# Patient Record
Sex: Female | Born: 1950 | Race: Black or African American | Hispanic: No | Marital: Married | Smoking: Never smoker
Health system: Southern US, Community
[De-identification: ages and names within clinical notes are randomized; demographics above are authoritative.]

## PROBLEM LIST (undated history)

## (undated) DIAGNOSIS — Z86010 Personal history of colonic polyps: Secondary | ICD-10-CM

## (undated) DIAGNOSIS — K642 Third degree hemorrhoids: Secondary | ICD-10-CM

## (undated) DIAGNOSIS — F32A Depression, unspecified: Secondary | ICD-10-CM

## (undated) DIAGNOSIS — D249 Benign neoplasm of unspecified breast: Secondary | ICD-10-CM

## (undated) DIAGNOSIS — F329 Major depressive disorder, single episode, unspecified: Secondary | ICD-10-CM

## (undated) DIAGNOSIS — I10 Essential (primary) hypertension: Secondary | ICD-10-CM

## (undated) DIAGNOSIS — E559 Vitamin D deficiency, unspecified: Secondary | ICD-10-CM

## (undated) DIAGNOSIS — K56609 Unspecified intestinal obstruction, unspecified as to partial versus complete obstruction: Secondary | ICD-10-CM

## (undated) DIAGNOSIS — F419 Anxiety disorder, unspecified: Secondary | ICD-10-CM

## (undated) HISTORY — PX: ABDOMINAL HYSTERECTOMY: SHX81

## (undated) HISTORY — PX: COLONOSCOPY: SHX174

## (undated) HISTORY — DX: Depression, unspecified: F32.A

## (undated) HISTORY — DX: Vitamin D deficiency, unspecified: E55.9

## (undated) HISTORY — PX: ESOPHAGOGASTRODUODENOSCOPY: SHX1529

## (undated) HISTORY — DX: Third degree hemorrhoids: K64.2

## (undated) HISTORY — DX: Personal history of colonic polyps: Z86.010

## (undated) HISTORY — PX: FINGER FRACTURE SURGERY: SHX638

## (undated) HISTORY — DX: Anxiety disorder, unspecified: F41.9

## (undated) HISTORY — PX: ANKLE FRACTURE SURGERY: SHX122

## (undated) HISTORY — PX: TONSILLECTOMY AND ADENOIDECTOMY: SUR1326

## (undated) HISTORY — PX: HEMORRHOID BANDING: SHX5850

## (undated) HISTORY — DX: Major depressive disorder, single episode, unspecified: F32.9

## (undated) HISTORY — PX: OVARIAN CYST REMOVAL: SHX89

---
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
## 1998-05-11 ENCOUNTER — Encounter: Payer: Self-pay | Admitting: Emergency Medicine

## 1998-05-11 ENCOUNTER — Inpatient Hospital Stay (HOSPITAL_COMMUNITY): Admission: EM | Admit: 1998-05-11 | Discharge: 1998-05-12 | Payer: Self-pay | Admitting: Emergency Medicine

## 1998-05-29 ENCOUNTER — Ambulatory Visit (HOSPITAL_COMMUNITY): Admission: RE | Admit: 1998-05-29 | Discharge: 1998-05-29 | Payer: Self-pay | Admitting: Orthopedic Surgery

## 1999-07-20 ENCOUNTER — Other Ambulatory Visit: Admission: RE | Admit: 1999-07-20 | Discharge: 1999-07-20 | Payer: Self-pay | Admitting: Obstetrics and Gynecology

## 2000-04-24 ENCOUNTER — Inpatient Hospital Stay (HOSPITAL_COMMUNITY): Admission: EM | Admit: 2000-04-24 | Discharge: 2000-04-26 | Payer: Self-pay | Admitting: *Deleted

## 2000-04-26 ENCOUNTER — Inpatient Hospital Stay: Admission: EM | Admit: 2000-04-26 | Discharge: 2000-04-29 | Payer: Self-pay | Admitting: Internal Medicine

## 2000-04-26 ENCOUNTER — Encounter (INDEPENDENT_AMBULATORY_CARE_PROVIDER_SITE_OTHER): Payer: Self-pay | Admitting: Specialist

## 2004-01-31 ENCOUNTER — Emergency Department (HOSPITAL_COMMUNITY): Admission: EM | Admit: 2004-01-31 | Discharge: 2004-01-31 | Payer: Self-pay | Admitting: Emergency Medicine

## 2004-02-21 ENCOUNTER — Emergency Department (HOSPITAL_COMMUNITY): Admission: EM | Admit: 2004-02-21 | Discharge: 2004-02-21 | Payer: Self-pay | Admitting: Emergency Medicine

## 2006-06-19 ENCOUNTER — Emergency Department (HOSPITAL_COMMUNITY): Admission: EM | Admit: 2006-06-19 | Discharge: 2006-06-19 | Payer: Self-pay | Admitting: Emergency Medicine

## 2006-07-18 ENCOUNTER — Inpatient Hospital Stay (HOSPITAL_COMMUNITY): Admission: RE | Admit: 2006-07-18 | Discharge: 2006-07-21 | Payer: Self-pay | Admitting: Obstetrics and Gynecology

## 2006-07-18 ENCOUNTER — Encounter (INDEPENDENT_AMBULATORY_CARE_PROVIDER_SITE_OTHER): Payer: Self-pay | Admitting: Specialist

## 2007-04-04 ENCOUNTER — Ambulatory Visit: Payer: Self-pay | Admitting: *Deleted

## 2007-04-04 ENCOUNTER — Inpatient Hospital Stay (HOSPITAL_COMMUNITY): Admission: AD | Admit: 2007-04-04 | Discharge: 2007-04-06 | Payer: Self-pay | Admitting: *Deleted

## 2007-04-18 ENCOUNTER — Inpatient Hospital Stay (HOSPITAL_COMMUNITY): Admission: EM | Admit: 2007-04-18 | Discharge: 2007-04-21 | Payer: Self-pay | Admitting: Emergency Medicine

## 2007-04-21 ENCOUNTER — Inpatient Hospital Stay (HOSPITAL_COMMUNITY): Admission: AD | Admit: 2007-04-21 | Discharge: 2007-04-25 | Payer: Self-pay | Admitting: *Deleted

## 2007-04-26 ENCOUNTER — Other Ambulatory Visit (HOSPITAL_COMMUNITY): Admission: RE | Admit: 2007-04-26 | Discharge: 2007-07-25 | Payer: Self-pay | Admitting: Psychiatry

## 2007-04-26 ENCOUNTER — Ambulatory Visit: Payer: Self-pay | Admitting: Psychiatry

## 2007-05-22 ENCOUNTER — Emergency Department (HOSPITAL_COMMUNITY): Admission: EM | Admit: 2007-05-22 | Discharge: 2007-05-22 | Payer: Self-pay | Admitting: Emergency Medicine

## 2010-11-10 NOTE — H&P (Signed)
[name], [name] NO.:  [id number]   MEDICAL RECORD NO.:  [id number]          PATIENT TYPE:  INP   LOCATION:  0105                         FACILITY:  [facility]   PHYSICIAN:  Beckey Rutter, MD  DATE OF BIRTH:  02/17/1951   DATE OF ADMISSION:  04/18/2007  DATE OF DISCHARGE:                              HISTORY & PHYSICAL   CHIEF COMPLAINT:  Overdose.   HISTORY OF PRESENT ILLNESS:  This is 60 year old female with past  medical history significant for major depression disorder with multiple  hospitalizations to behavioral health facility, brought in today by EMS  after she took Ambien and Effexor in attempted suicide.  The overdose  happened this morning around 6 o'clock in the morning, was estimated no  more than 15 tablets of Ambien and less than 90 tablets of Effexor, and  it was estimated less than that.  Dose estimation was done because the  Ambien was filled 15 tablets only, and the Effexor was filled 90 tablets  only.  The patient also has similar suicide attempts in the past, and in  the last week or so she had attempted to overdose but was caught by the  husband who is at the bedside now giving history.  The patient also had  recent admission to Behavior Health floor about two weeks ago. During  that time she stayed 3 days under psychiatrist management.  Her primary  psychiatrist is Dr. Wynonia Lawman.  The patient now could not give history  herself, but she seems very sedated, having difficulty responding to  simple commands like open your mouth.  She is otherwise not  communicable.   PAST MEDICAL HISTORY:  Significant for major depression disorder with  previous attempted suicide.   FAMILY HISTORY:  Is significant for thyroid disease on the mother's  side.   SOCIAL HISTORY:  She lives at home with her husband and daughters. No  drug abuse, not a drinker and not a smoker.   MEDICATION ALLERGIES:  To PENICILLIN, no reaction specified.   MEDICATIONS:  1. Ambien 5 mg at night.  2. Effexor 300 mg in the morning.  3. Hormone replacement therapy, but the family could not remember the      name.   REVIEW OF SYSTEMS:  As per HPI.   PHYSICAL EXAMINATION:  GENERAL:  She was lying flat in bed not in acute  respiratory distress.  VITAL SIGNS:  Temperature is 97.3, blood pressure 88/65, pulse 97,  respiratory rate 12.  HEENT:  Head:  Atraumatic, normocephalic.  Eyes: PERRLA.  Mouth moist.  No ulcer.  NECK:  Supple.  No JVD.  CARDIAC:  Precordium first and second heart sounds audible.  No added  sounds.  LUNGS:  Bilateral fair air entry.  No adventitious sounds.  ABDOMEN:  Soft, no tenderness on palpation.  GU:  The patient has a Foley catheter in place.  EXTREMITIES:  No lower extremity edema.  NEUROLOGIC:  The patient seems sedated. She responds only to touch and  tactile stimuli by opening her eyes, but otherwise she is not  communicable. She responds to simple commands  like open her mouth, and  she does that in a slow, lethargic way.   LABORATORY AND X-RAY DATA:  Troponin less than 0.05, CK-MB less than  1.0. Second troponin is also less than 0.05.  Urinalysis showing yellow  urine, clear, negative nitrate and leukocyte Estrace. Urine drug screen  is essentially negative. Even benzodiazepines are stated as undetected.  Salicylate is less than 0.4.  Alcohol is less than 5.  Sodium is 139,  potassium low at 2.5, chloride 107, bicarb 25, glucose 120, BUN 2,  creatinine 0.70. Tylenol level in less than 10.0. White count is 2.7,  hemoglobin 9.3, hematocrit 28.7, and platelet count is 369.   EKG documented showing sinus rhythm at 100 beats per minute rate.  Normal axis, normal QRS and ST depression.   ASSESSMENT:  This is a 60 year old female with overdose.   PLAN:  1. The patient will be admitted to ICU for further management.  2. We will consider contacting the [facility].  3. Will keep the patient on IV fluid monitoring.  We will check her      labs, and we will continue to perform neurological checks.  4. We will consider psychiatric evaluation in due time.  5. For DVT prophylaxis, I will consider Lovenox.  6. For GI prophylaxis, I will consider Protonix.      Beckey Rutter, MD  Electronically Signed     EME/MEDQ  D:  04/18/2007  T:  04/18/2007  Job:  [id number]

## 2010-11-10 NOTE — H&P (Signed)
[name], GOSNEY NO.:  [id number]   MEDICAL RECORD NO.:  [id number]          PATIENT TYPE:  IPS   LOCATION:  0601                          FACILITY:  [facility]   PHYSICIAN:  Vic Ripper, P.A.-C.DATE OF BIRTH:  11/08/1950   DATE OF ADMISSION:  04/21/2007  DATE OF DISCHARGE:                       PSYCHIATRIC ADMISSION ASSESSMENT   IDENTIFYING INFORMATION:  This is a 60 year old married African American  female.  She was admitted to the main hospital 04/18/2007, after an  intentional overdose on 10-15 tabs of Ambien and 90 Effexor.  She  reports that she has had depression since age 74.  She was recently with  [place] 04/04/2007 to 04/06/2007.  She felt the depression coming on.  She  tried to get ahead of it by coming into the hospital.  She has had a  variety of stressors, mostly involving her children.  Her daughter, who  is bipo9lar, is having a child out of wedlock.  Her husband was laid  off, and her grown son is also having some type of issues.  The patient  states that she was feeling catastrophic levels of anxiety, and that  there was no other way out other than to overdose.   PAST PSYCHIATRIC HISTORY:  She has been hospitalized at least 3 times in  the past, earlier this month 04/04/2007 to 04/06/2007.  She was also  admitted in 2001, to the [facility].  At that time, she  was having auditory and visual hallucinations that were mood congruent.  At that time, she was discharged on Depakote1000, Risperdal 2, and  Effexor 300 mg p.o. daily.  She states that her outpatient psychiatrist  of 15 years, Dr. Wynonia Lawman, ended up stopping the Risperdal due to  bloating.  She had also attempted suicide 15 years ago, and at that  time, she was admitted to the then [facility].   SOCIAL HISTORY:  She has her Masters Training and development officer in Social Work from [employer].  She has been married once.  She has 3 children, a daughter age  68, a son 51, and  another daughter 30.  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   FAMILY HISTORY:  According to the intake record, her daughter and  grandmother are bipolar.  She denies alcohol and drug history.   PRIMARY CARE PHYSICIAN:  Dr. Tillman Sers, OB/Gyn; Dr. Wynonia Lawman,  outpatient psychiatrist.   MEDICAL PROBLEMS:  She is status post hysterectomy in January 2008.  She  was discharged from the main hospital with medical diagnoses of anemia  and transient hyperkalemia.  She had some iron studies performed; this  showed she had a normal B12 and folate.  She iron level was only 21, and  she would probably benefit from oral iron; therefore, they started 325  mg iron t.i.d.   MEDICATIONS:  She came over from the main hospital with a suggestion of  Restoril 15 mg at bedtime and, also Dr. Jeanie Sewer suggested starting  Cymbalta in his consultation.   ALLERGIES:  PENICILLIN.   PHYSICAL EXAMINATION:  GENERAL:  It is well documented and  on the chart.  VITAL SIGNS ON ADMISSION:  Height 66 inches.  Weight 158.  Temperature  98.  Blood pressure 152/96.  Pulse 118.  Respirations 18.   MENTAL STATUS EXAM:  She is alert and oriented x3.  She is appropriately  groomed, dressed, and nourished.  Her speech is not pressured.  Her mood  is depressed and anxious.  Affect is congruent.  Her thought processes  are fairly clear, rational, and goal oriented.  She is concerned about  making arrangements at her work place.  Judgment and insight are good.  Concentration and memory are good.  Intelligence is at least average.  She denies being suicidal or homicidal.  She denies auditory, visual  hallucinations.   DIAGNOSES:  AXIS I:  Major depressive disorder, recurrent severe.  Mood  disorder NOS.  Family history for bipolar.  AXIS II:  Deferred.  AXIS III:  Normocytic anemia with iron deficiency, hypokalemia.  AXIS IV:  Severe problems with primary support group.  AXIS V:  30.   PLAN:  Admit for safety and  stabilization.  We can go ahead and initiate  Cymbalta therapy as suggested by Dr. Jeanie Sewer.  Given her history for  suicidal ideation and anxiety as well as sleep issues, we will start  some Zyprexa Zydis at bedtime to help with sleep and mood stabilization.   ESTIMATED LENGTH OF STAY:  Four to 5 days.      Vic Ripper, P.A.-C.     MD/MEDQ  D:  04/22/2007  T:  04/23/2007  Job:  [id number]

## 2010-11-10 NOTE — Consult Note (Signed)
NAMEMarland [name]  [name], VENO NO.:  [id number]   MEDICAL RECORD NO.:  [id number]          PATIENT TYPE:  IPS   LOCATION:  0601                          FACILITY:  [facility]   PHYSICIAN:  Antonietta Breach, M.D.  DATE OF BIRTH:  02/06/1951   DATE OF CONSULTATION:  04/20/2007  DATE OF DISCHARGE:                                 CONSULTATION   The patient continues with depressed mood, thoughts of hopelessness,  helplessness, low energy, difficulty concentrating and insomnia.   LABORATORY DATA:  B-12 is within normal limits.  Iron is decreased at 21  with a TIBC within normal limits and a 5% saturation.   PHYSICAL EXAMINATION:  VITAL SIGNS:  Temperature 98.7, pulse 95,  respiratory rate 18, blood pressure 125/69, O2 saturation on room air  98%.  The QTC has improved on telemetry and now is down to 470  milliseconds.   MENTAL STATUS EXAM:  Alert, oriented to all spheres.  Eye contact  intermittent.  Attention span mildly decreased.  Concentration  decreased.  Affect constricted.  Mood depressed.  Speech soft with  slightly flat prosody.  There is no dysarthria.   Thought process logical, coherent, goal-directed.  No looseness of  associations.  Thought content - acknowledges suicidal intent, thoughts  of hopelessness and helplessness.  No hallucinations or delusions.   Insight is intact for depression.  Judgment is impaired for self-care;  however, she does have the capacity for informed consent.   ASSESSMENT:  1. 293.84 - anxiety disorder not otherwise specified.  2. 296.33 - major depressive disorder, recurrent, severe.   The patient requested that her husband attend the session for  facilitating education and support.   The undersigned provided ego supportive psychotherapy and education.  The biopsychosocial precipitants and predispositions of depression were  discussed.  The biologic therapy with Cymbalta was discussed, as well as  the delay that occurred due to the  prolongation of the QTC.   RECOMMENDATIONS:  Continue the sitter.  Admit to a psychiatric ward for  further evaluation and treatment once medically cleared.   RECOMMENDATIONS:  Will delay starting the Cymbalta, given that the  patient will be transferring to a psychiatric hospital soon, and the  discussion of all therapies can proceed initially with the patient and  her inpatient psychiatrist.   TIME FOR THIS FOLLOW UP:  Forty-five minutes.      Antonietta Breach, M.D.  Electronically Signed     JW/MEDQ  D:  04/22/2007  T:  04/24/2007  Job:  [id number]

## 2010-11-10 NOTE — Consult Note (Signed)
NAMECHANCI, OJALA NO.:  [id number]   MEDICAL RECORD NO.:  [id number]          PATIENT TYPE:  INP   LOCATION:  1503                         FACILITY:  [facility]   PHYSICIAN:  Antonietta Breach, M.D.  DATE OF BIRTH:  01/14/1951   DATE OF CONSULTATION:  04/19/2007  DATE OF DISCHARGE:                                 CONSULTATION   REFERRING PHYSICIAN:  [facility].   REASON FOR CONSULTATION:  Suicide attempt with overdose.   HISTORY OF PRESENT ILLNESS:  [name] is a 60 year old  female admitting to [facility] on April 18, 2007 after a  deliberate overdose with Ambien and Effexor in order to kill herself.  [name] has been experiencing a number of stressors.  Her husband  was laid off. Also one of her children has a pregnancy out of wedlock.  She was trying to go back to work in the past few weeks and has  experienced two weeks of worsening mood, thoughts of hopelessness and  helplessness, low energy, difficulty concentrating, and insomnia.  She  does acknowledge that she was trying to kill herself.  She states that  she was also feeling catastrophic levels of anxiety as if there were no  other way out.   PAST PSYCHIATRIC HISTORY:  The patient does have a history of at least  three psychiatric hospitalizations with the last psychiatric  hospitalization earlier this month at the [facility].   [name] has attempted suicide approximately 15 years ago.  At that  time she was admitted to [facility].   In 2001, she was admitted to the [facility].  She was  having auditory and visual hallucinations that were mood congruent at  that time.  She was discharged on Depakote 1000 mg daily, Risperdal 2 mg  daily, and Effexor 300 mg daily.  The patient denies any history of  excess energy, decreased need for sleep or racing thoughts.   FAMILY PSYCHIATRIC HISTORY:   SOCIAL  HISTORY:  [name] is married to a supportive husband.  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  She does not use any illegal  drugs or alcohol.  She is a Chiropodist.   PAST MEDICAL HISTORY:  Status post overdose.   ALLERGIES:  PENICILLIN, NICKEL, LANOLIN.   MEDICATIONS:  The MAR is reviewed.  The patient is not on any current  psychotropic medications.   LABORATORY DATA:  WBC 5.5, hemoglobin 9, platelet count 319.  SGOT 19,  SGPT 11. TSH was within normal limits on October 7.  Urine drug screen  negative.  Aspirin negative.  Alcohol negative.  Tylenol negative.   The patient's QTC on telemetry today has been between 480 msec and 580  msec.   REVIEW OF SYSTEMS:  CONSTITUTIONAL:  Afebrile.  No weight loss.  HEAD:  No trauma.  EYES:  No visual changes.  EARS:  No hearing impairment.  NOSE:  No rhinorrhea.  MOUTH/THROAT:  No sore throat.  NEUROLOGIC:  No  focal motor or sensory changes.  PSYCHIATRIC:  As  above.  CARDIOVASCULAR:  As above.  No chest pain, palpitations.  RESPIRATORY:  No coughing, no wheezing.  GASTROINTESTINAL:  No nausea, vomiting,  diarrhea.  GENITOURINARY:  No dysuria.  SKIN:  Unremarkable.  ENDOCRINE:  Metabolic.  No heat or cold intolerance.  MUSCULOSKELETAL:  No deformities.  HEMATOLOGIC/LYMPHATIC:  Anemia.   PHYSICAL EXAMINATION:  VITAL SIGNS:  Temperature 98.5, pulse 71,  respiratory rate 20, blood pressure 158/81, oxygen saturation on room  air 96%.  GENERAL APPEARANCE:  [name] is a middle-aged female, partially  reclined in a supine position in her hospital bed.  She has no abnormal  involuntary movements.   OTHER MENTAL STATUS EXAM:  [name] has intermittent eye contact.  Her attention span is mildly decreased.  Concentration is mildly  decreased.  Affect is constricted.  Mood is very depressed.  She is  oriented in all spheres.  Her memory is intact to immediate, recent, and  remote except for the overdose blackout.  She has  normal rate of speech,  slightly flat prosody.  There is no dysarthria.  Her fund of knowledge  and intelligence are within normal limits.  Thought process is logical,  coherent, goal directed, no looseness of associations. Abstraction is  intact.  Language expression and comprehension is intact.  Thought  content:  She acknowledges suicide intent.  She has no thoughts of  harming others.  No delusions.  No hallucinations.  Insights are  partial.  Judgment is impaired.   ASSESSMENT:  Axis I:  1.  293.84.  Anxiety disorder, not otherwise  specified.  1. 296.33.  Major depressive disorder, recurrent, severe.  Axis II:  Deferred.  Axis III:  See general medical section above.  Axis IV:  Primary support group.  Axis V:  30.   [name] is still at risk to harm herself.   The undersigned provided ego supportive psychotherapy and education.   The indications, alternatives, and adverse effects of Cymbalta were  discussed with the patient for antidepression.  The patient understands  and would like to start Cymbalta.   Given that the QTC has been prolonged after apparently secondary to the  absorption of the Effexor overdose.  Will allow the QTC to fall back  down to consistently below 500 before starting the Cymbalta.   Would continue the sitter for suicide precautions.   Discuss Restoril for anti-insomnia.  The patient would like to start  Restoril.  Will start Restoril 15 mg at bedtime p.r.n. insomnia.   Would admit the patient to a psychiatric unit for further evaluation and  treatment once medically clear.      Antonietta Breach, M.D.  Electronically Signed    JW/MEDQ  D:  04/21/2007  T:  04/21/2007  Job:  [id number]

## 2010-11-10 NOTE — Discharge Summary (Signed)
NAMEJENAYA, [name]             ACCOUNT NO.:  [id number]   MEDICAL RECORD NO.:  [id number]          PATIENT TYPE:  INP   LOCATION:  1503                         FACILITY:  [facility]   PHYSICIAN:  Lonia Blood, M.D.      DATE OF BIRTH:  May 01, 1951   DATE OF ADMISSION:  04/18/2007  DATE OF DISCHARGE:  04/20/2007                               DISCHARGE SUMMARY   PRIMARY CARE PHYSICIAN:  The patient is unassigned to [place].   DISCHARGE DIAGNOSES:  1. Drug overdose with Ambien and Effexor.  2. Attempted suicide.  3. Normocytic anemia with iron deficiency.  4. Transient hyperkalemia.  5. Major depression.   DISCHARGE MEDICATIONS:  1. Restoril 50 mg p.o. q.h.s. p.r.n.  2. Protonix 40 mg p.o. daily.   DISPOSITION:  The patient is to be transferred to inpatient psych unit  per Dr. Providence Crosby recommendation for further therapy.  She is off  Cymbalta until her QTc interval improves again.  It was 446 initially.   PROCEDURES:  Chest x-ray on April 18, 2007, that showed no evidence of  acute cardiopulmonary disease.   CONSULTATIONS:  Dr.  Jeanie Sewer, Psychiatry.   HISTORY OF PRESENT ILLNESS:  Please refer to History and Physical  dictated by Dr. Tamsen Roers.  In short, however, the patient is a 60 year old  with known history of major depression and multiple psychiatric  evaluations in the past with admission.  She was brought in secondary to  intentional drug overdose with Ambien and Effexor with attempted  suicide.  She took probably 10-15 tablets of Ambien and 90 tablets of  Effexor.  At the time of admission, the patient was completely knocked  out.  Her blood pressure was 88/65. Her lab work also showed prolonged  QTc interval at 446.  Otherwise, no significant findings.  She was  subsequently admitted for further management.   HOSPITAL COURSE:  #1 - INTENTIONAL DRUG OVERDOSE.  The patient was  observed initially in the ICU with complete telemetry monitoring.  Supportive measures include  IV fluids and this in turn was given.  The  patient was worked up over 24 hours, and she seems stabilized.  She was  seen by Psychiatry and was evaluated.  Recommendation was for inpatient  psych.  Hence, we will continue with that.   #2 - MAJOR DEPRESSION.  The patient is off Cymbalta once her QTc  interval stabilizes.  However, she will have inpatient psych management  as indicated .   #3 - ANEMIA.  She has some iron studies performed due to hemoglobin and  hematocrit.  That showed that she had normal B12 and folate.  However,  her iron level is only 21.  She is not having hematopoesis with a  reticulocyte count of 0.8.  She will probably benefit from oral iron,  therefore, like 325 mg t.i.d.   #4 - TRANSIENT HYPERKALEMIA.  Her potassium was 3.01 on admission.  However, that has been corrected.      Lonia Blood, M.D.  Electronically Signed     LG/MEDQ  D:  04/20/2007  T:  04/20/2007  Job:  [id number]

## 2010-11-10 NOTE — Discharge Summary (Signed)
NAMEELOYSE, CAUSEY NO.:  [id number]   MEDICAL RECORD NO.:  [id number]          PATIENT TYPE:  IPS   LOCATION:  0305                          [facility]ACILITY:  [facility]   PHYSICIAN:  Anselm Jungling, MD  DATE OF BIRTH:  02/01/1951   DATE OF ADMISSION:  04/04/2007  DATE OF DISCHARGE:  04/06/2007                               DISCHARGE SUMMARY   IDENTIFYING DATA AND REASON FOR ADMISSION:  This was an inpatient  psychiatric admission for Mrs. Ibanez, a 60 year old married African  American female who is a patient of Dr. Wynonia Lawman, and Saul Fordyce, nurse  practitioner.  She was admitted due to worsening depression with  suicidal ideation.  She had been on a regimen of Effexor, Ambien, and  Remeron, but had had increasing weight loss, inability to perform at  work.  Her husband had just been laid off which was an additional  stressor.  She had been on Effexor for 10 years at a low dose, but her  dose had been increased two weeks prior.  Please refer to the admission  note for further details pertaining to the symptoms, circumstances and  history that led to her hospitalization.  She was given an initial Axis  I diagnosis of major depressive disorder, recurrent, without psychotic  features.   MEDICAL AND LABORATORY:  The patient was medically and physically  assessed by the psychiatric nurse practitioner.  She came to [place] with a  history of good health.  She had been taking estradiol, and this was  continued during her stay.   HOSPITAL COURSE:  The patient was admitted to the adult inpatient  psychiatric service.  She presented as a slender, but normally developed  and healthy-appearing woman who was alert, fully oriented, quite  articulate, with good insight.  Her mood was depressed with sad affect..  She denied suicidal ideation in the initial interview.  There were no  signs or symptoms of psychosis or thought disorder.  She verbalized a  desire for help, but expressed  feeling defeated at needing to come for  inpatient hospital treatment.   The patient was continued on her increased dose of Effexor, as it was  felt that this was a reasonable medication strategy that needed more  time to work.  She did not present much in the way of acute symptoms  requiring inpatient hospitalization, so immediate consideration was  given to step-down to the intensive outpatient program.  However, the  patient indicated that she was not interested in this, and was hoping  for a rapid transition back to working.  She did agree to go to follow-  up aftercare appointments with Saul Fordyce and Dr. Warnell Bureau.   On the third hospital day there was a family session involving the  patient, her husband and daughter.  The family members talked of their  frustration regarding the patient's denial of her symptoms of  depression.  The patient admitted that this was a problem and spoke of  her shame concerning her mood disorder and difficulty talking about it  within the family.  The family members were  guarded about their sharing  of feelings amongst each other and stressed that because the patient  daughter also has a mood disorder and lives at home, there was further  attention present, in part because the daughter spoke of her intention  to move out of the house soon.  The patient spoke of her wanting to  return to work soon and to follow-up with her outpatient psychiatrist.  The family overall was very supportive but quite frustrated.  Education  was given regarding the [employer], and  codependency.  The patient indicated that she felt ready for discharge.  She denied suicidal ideation.  She was discharged following this  meeting.   AFTERCARE:  The patient agreed to follow-up with Dr. Wynonia Lawman on May 04, 2007, and with Tresa Endo vigil on April 13, 2007.   The patient was instructed to continue her medications at their previous  doses,  including Effexor, 200 mg q. a.m. and 100 mg q.p.m., Remeron 15  mg q.h.s., Ambien 10 mg h.s. p.r.n. insomnia and estradiol 1 mg every  other day.   DISCHARGE DIAGNOSES:  AXIS I: Major depressive disorder, recurrent,  without psychotic features.  AXIS II: Deferred.  AXIS III: No acute or chronic illnesses.  AXIS IV: Stressors severe.  AXIS V: GAF on discharge 65.      Anselm Jungling, MD  Electronically Signed     SPB/MEDQ  D:  05/12/2007  T:  05/12/2007  Job:  [id number]

## 2010-11-13 NOTE — Op Note (Signed)
NAME:  [name], [name] NO.:  [id number]   MEDICAL RECORD NO.:  [id number]          PATIENT TYPE:  INP   LOCATION:  9318                          FACILITY:  [facility]   PHYSICIAN:  Malva Limes, M.D.    DATE OF BIRTH:  [birth year]   DATE OF PROCEDURE:  07/18/2006  DATE OF DISCHARGE:                               OPERATIVE REPORT   PREOPERATIVE DIAGNOSIS:  Large left ovarian mass, probable dermoid cyst.   POSTOPERATIVE DIAGNOSIS:  Large left ovarian mass, probable dermoid  cyst, with extensive adhesions.   PROCEDURES:  1. Total abdominal hysterectomy and left salpingo-oophorectomy.  2. Lysis of adhesions.   SURGEON:  Malva Limes, M.D.   ASSISTANT:  Luvenia Redden, M.D.   ANESTHESIA:  General endotracheal.   ANTIBIOTICS:  Mefoxin 1 g.   DRAINS:  Foley to bedside drainage.   ESTIMATED BLOOD LOSS:  350 mL.   SPECIMENS:  1. Cervix, uterus, fallopian tubes and left large ovarian cyst sent to      pathology.  2. Frozen section was obtained on the ovary, which revealed a benign      dermoid cyst.   PROCEDURE:  The patient was taken to the operating room, where she was  placed in a dorsal supine position.  A general anesthetic was  administered without complications.  She was then prepped and draped in  the usual fashion for this procedure.  A Foley catheter was placed in  her bladder.  At this point a vertical skin incision was made.  This was  carried down to the fascia.  The fascia was entered at the midline.  The  rectus muscles were dissected and opened sharply.  The parietal  peritoneum was entered sharply and taken superiorly and inferiorly.  On  entering the abdominal cavity, the patient was noted to have extensive  adhesion involving the small bowel, posterior cul-de-sac, left pelvic  sidewall, uterus, and ovarian cyst.  The patient also had extensive  adhesions, perihepatic adhesions to the diaphragm.  There was no  evidence of any satellite lesions  on the bowel or in the pelvis.  The  capsule of the ovary appeared to be smooth, however multilobulated.  Initially an O'Connor-O'Sullivan retractor was placed and an much of the  bowel packed away.  The small bowel which was adherent to the left ovary  was dissected with Metzenbaums and Bovie.  Once this was accomplished,  the remaining bowel was packed away.  At this point the left round  ligament was ligated and cauterized with the Bovie.  The broad ligament  was opened, the ureter identified.  Once this was accomplished, the  adhesions involving the left ovary and the uterus were taken down  sharply.  Following this, the adhesions involving the posterior cul-de-  sac and ovary were all taken down sharply.  Once this was accomplished,  the uterus was elevated, infundibulopelvic ligament isolated, doubly  ligated x2 and transected.  The specimen was then handed off to be sent  to the lab for frozen section.  Once this was accomplished, small areas  of bleeding were made hemostatic  with the Bovie.  At this point the  round ligament on the right was ligated and transected with the Bovie.  The bladder flap was taken down sharply.  The uterine vessels were  skeletonized, bilaterally clamped and cut and ligated with 0 Monocryl  suture.  The cardinal ligaments were then serially clamped, cut and  ligated with 0 Monocryl suture.  Once the level of the external os was  reached, the vagina was crossclamped and the specimen removed.  The  vaginal angles were then sutured with 0 Monocryl suture in a Heaney  fashion.  The remaining vaginal cuff was closed using interrupted 0  Monocryl suture in a figure-of-eight fashion.  Following this the pelvis  was irrigated and found to be hemostatic.  The ureters were again  reexamined and felt to be normal, well out of the operative field.  The  bowel was then run.  No evidence of any other adhesions or lesions were  identified.  He once this was  accomplished, the retractor was removed,  the laps removed, and the vertical incision closed with 0 PDS in a  running Smead-Jones fashion.  Subcuticular tissue was made hemostatic  with interrupted 2-0 plain gut suture.  Stainless steel clips were used  to close the skin.  The patient was extubated and taken to the recovery  room in stable condition.  Instrument and lap counts correct x2.           ______________________________  Malva Limes, M.D.     MA/MEDQ  D:  07/18/2006  T:  07/18/2006  Job:  [id number]

## 2010-11-13 NOTE — H&P (Signed)
NAME:  [name], [name] NO.:  [id number]   MEDICAL RECORD NO.:  [id number]          PATIENT TYPE:  AMB   LOCATION:  SDC                           [facility]ILITY:  [facility]   PHYSICIAN:  Malva Limes, M.D.    DATE OF BIRTH:  26-Sep-1950   DATE OF ADMISSION:  DATE OF DISCHARGE:                              HISTORY & PHYSICAL   Office chart number, [id number].   HISTORY OF PRESENT ILLNESS:  [name] is a 60 year old black female,  G4, P3--1-3, who presents to [facility] for a total abdominal  hysterectomy with left salpingo-oophorectomy secondary to a large  ovarian mass felt to be a dermoid cyst.  The patient presented to the  emergency room on June 19, 2006, complaining of a 46-month history of  worsening right upper quadrant pain and lower abdominal pain.  The  patient had not had a GYN exam for 7 years prior to that.  The patient  underwent an extensive workup in the emergency room.  She was found to  have a 14-to-15-cm pelvic mass which was felt to be consistent with a  dermoid cyst.  The patient also had a questionable 1-cm perirectal node.  The patient has no ascites.  A normal liver and gallbladder ultrasound  was also obtained.  The patient's blood work was all normal.  The  patient states that her pain seems to exacerbation when she has a full  bladder and shortly after eating.  The patient had a negative  colonoscopy 3 years ago.  She also has a history of right salpingo-  oophorectomy in 1970 for a dermoid cyst.  The patient did have a CA-125  level which was 70.2, slightly elevated.  This was discussed with the  GYN oncologist.  Given the findings on CT scan, it was felt that it was  unlikely that the patient had a malignancy.  However, a frozen section  will be obtained on the mass after it will be removed.  Pelvic washings  will also be obtained.   PAST MEDICAL HISTORY:  THE PATIENT HAS AN ALLERGY TO PENICILLIN.   CURRENT MEDICATIONS:  Venlafaxine  hydrochloride 75 mg every day.   PAST SURGERIES:  1. Right salpingo-oophorectomy as mentioned above.  2. Bilateral tubal ligation in 1982.   The patient has also had a normal mammogram one month ago.   The patient denies smoking, alcohol, or drug use.   The patient has a family history of cardiovascular disease,  hypertension, and stroke.  She has no history of ovarian or colon  cancer.   PHYSICAL EXAMINATION:  VITAL SIGNS:  The patient weighs 178 pounds.  She  is 5 feet 8 inches.  GENERAL:  She appears to be in no apparent distress.  HEENT:  Within normal limits.  LUNGS:  Clear to auscultation.  CARDIOVASCULAR:  Reveals a regular rate and rhythm without murmurs.  BREASTS:  Normal.  There are no masses or lymphadenopathy.  ABDOMEN:  Soft, nontender.  I am unable to completely palpate the  abdominal mass.  She has no guarding or rebound.  There is no tympani.  PELVIC:  Reveals normal external genitalia.  The vagina is without  lesions or discharge.  The cervix is parous.  The uterus is not easily  palpated.  The adnexa is without masses.  RECTAL:  The rectum is normal except for a hemorrhoid.   IMPRESSION:  Symptomatic left ovarian mass, likely dermoid.   PLAN:  Proceed with total abdominal hysterectomy and left salpingo-  oophorectomy.           ______________________________  Malva Limes, M.D.     MA/MEDQ  D:  07/16/2006  T:  07/16/2006  Job:  [id number]

## 2010-11-13 NOTE — Discharge Summary (Signed)
[name], [name] NO.:  [id number]   MEDICAL RECORD NO.:  [id number]          PATIENT TYPE:  IPS   LOCATION:  0601                          [facility]ACILITY:  [facility]   PHYSICIAN:  Anselm Jungling, MD  DATE OF BIRTH:  05/01/1951   DATE OF ADMISSION:  04/21/2007  DATE OF DISCHARGE:  04/25/2007                               DISCHARGE SUMMARY   DISCHARGE SUMMARY/TRANSFER SUMMARY:  (Discharged from inpatient  psychiatry and transferred to intensive outpatient psychiatry).   IDENTIFYING DATA/REASON FOR ADMISSION:  This was the second [facility]  admission for [name], a 60 year old married African-American female.  She had been admitted to our inpatient service earlier in the month.  She was readmitted after an overdose of 10-15 tablets of Ambien and 90  tablets of Effexor.  She had been discharged from our inpatient program  on April 06, 2007.  She was able to name various stressors, mostly  involving her children, including a daughter with bipolar disorder and  having a child out of wedlock.  Another stressor was her husband's  recently being laid off from his job.  Please refer to the admission  note for further details pertaining to the symptoms, circumstances and  history that led to her hospitalization.   INITIAL DIAGNOSTIC IMPRESSION:  She was given an initial AXIS I  diagnosis of major depressive disorder, recurrent, severe.  She had been  the patient of Dr. Wynonia Lawman.   MEDICAL/LABORATORY:  The patient was treated medically for her overdose  in the medical hospital.  While there, she was seen by Dr. Jeanie Sewer,  the psychiatric consultant who recommended transfer to inpatient  psychiatry for further stabilization.  She had been on a regimen of  Zyprexa, Cymbalta, and Restoril.  These were continued in her inpatient  stay.  She participated in various therapeutic groups and activities and  was a good participant in the treatment program.   She presented as a  well-nourished, well-developed African-American woman  who was pleasant, fully cooperative, and fully oriented.  Her mood was  depressed with sad affect.  She indicated that she had overdosed because  she had gotten overwhelmed by virtue of various stressors.  She  indicated that she was glad that she had survived her suicide attempt  and wanted help.   A family session was arranged which occurred on the day prior to  discharge.  Present for that meeting were her husband and two adult  daughters.  The patient's husband and daughters indicated that they had  become more concerned over time about the patient because this was her  fourth suicide attempt.  The family discussed their desire to talk more  openly.  The patient admitted to not telling her family when she is  depressed because of feelings of shame.  There was discussion about  aftercare plans including the patient seeing her usual outpatient  therapist, Dr. Wynonia Lawman, and possibly attending intensive outpatient  psychiatric program.   On the following day, the undersigned met with the patient.  She  appeared to be in better spirits, looked forward to going home, and  indicated  that she would definitely attend the intensive outpatient  program.  This had been recommended to her following her last  hospitalization, but she elected not to enroll in that program.   AFTERCARE:  The patient was to report to [facility] intensive outpatient  program on the morning following admission, that is April 26, 2007,  8:30 a.m.   DISCHARGE MEDICATIONS:  1. Zyprexa 5 mg q.h.s.  2. Cymbalta 30 mg daily.  3. Restoril 15 mg q.h.s.   DISCHARGE DIAGNOSES:  AXIS I:  Major depressive disorder, recurrent,  severe without psychotic features.  AXIS II:  Deferred.  AXIS III:  No acute or chronic illnesses.  AXIS IV:  Stressors:  Severe.  AXIS V:  GAF on discharge 50.      Anselm Jungling, MD  Electronically Signed     SPB/MEDQ  D:   04/26/2007  T:  04/26/2007  Job:  [id number]

## 2010-11-13 NOTE — H&P (Signed)
[facility]  Patient:    [name], DATE                    MRN: [id number] Adm. 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 The patient states "I have had a psychotic breakdown."  The patient has been under the care of Dr. ______ since June, 1995 for recurrent depression and suicidal ideas.  She has recurrent psychiatric hospitalization with past hospitalizations under Dr. ______ at [facility].  Current medicines are Effexor 300 mg q.h.s. which she tolerates well and with benefit.  The patient recently discovered her mother may be dying and shifted into depression.  She presents stating she is hallucinating auditorily and visually.  She is hearing the [facility] and the devil.  These represent to her the forces of good and evil.  She has also been visualizing hell and heaven.  Given the magnitude of her psychosis and disorganization, her hospitalization was supported on a voluntary basis.  PAST MEDICAL HISTORY:  The patient has enjoyed good physical health.  She has enjoyed good physical health all of her life.  She is under the care of Dr. Malva Limes who is her family physician.  She has a history of arthritis and low back pain and is on Celebrex.   She has had a recent bout with the flu.  ALLERGIES:  No known drug allergies.  LABORATORY DATA:  Blood work was reviewed and she is mildly hypokalemic with a potassium of 3.2  Red blood corpuscles are low at 3.4 with a hemoglobin of 7.8 and a hematocrit of 24.  PHYSICAL EXAMINATION:  The patient is going to be sent to [facility] ER for physical examination.  Will also ask Metabolic for a consult.  She does not have a history of thalassemia, weight loss, tarry stools, and she is menopausal.  Probably microcytic anemia.   She had her last physical two years ago and needs to be evaluated.  FAMILY HISTORY:  None.  SOCIAL HISTORY:  The patient has been married over 30 years.  She has three children.  She lost a child in infancy 30 years ago.  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  Her husband is a Armed forces training and education officer and they have a good marriage.  MENTAL STATUS EXAMINATION:  The patient presents as an interactive and neatly dressed lady who is able to provide a coherent history.  She is cooperative and pleasant.  Speech is normal in tone and flow.  Her mood is depressed and showed evidence of a flat affect.  She admits to visual and auditory hallucinations, but denies any paranoia.  She is cognitively intact and oriented x 3, alert.  Short term and remote memory are intact.  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 Axis II:   No diagnosis. Axis III:  Anemia, for evaluation. Axis IV:   Severe, psychosocial problems. Axis V:    Global assessment of functioning of 30 on admission and 80 during            the past year.  TREATMENT CONSIDERATIONS: 1. The patient is to be established on Risperdal  0.5 mg in a.m. and 1.5 mg    q.h.s., Depakote ER 1000 mg q.h.s. and Effexor XR dose will be lowered    to 150 mg q.d. 2. She was referred for evaluation of her anemia at [facility] Emergency    Room. 3. She is admitted to the stabilization program and will maintain on    standard 15 minute checks. 4. Urinalysis, urine drug screen, CMET, thyroid profile and CBC are requested. 5. She willl engage in activities as tolerated.  She wil be allowed p.r.n.s    and vital signs will be taken according to unit protocol. 6. The patient is to be followed daily for medication checks and    psychotherapy. 7. She will participate in individual and group cognitive behavioral therapy. DD:  04/26/00 TD:  04/26/00 Job: [id number]

## 2010-11-13 NOTE — Discharge Summary (Signed)
[facility]  Patient:    [name], [name]                    MRN: [id number] Adm. Date:  [id number] Disc. Date: [id number] Attending:  Tresa Garter CC:         Adelene Amas. Williford, M.D.  Wilhemina Bonito. Eda Keys., M.D. [facility]  Masoud S. Wynonia Lawman, M.D.   Discharge Summary  DISCHARGE DIAGNOSES: 1. Iron-deficiency anemia. 2. Cystitis. 3. Bronchitis. 4. Hypokalemia. 5. Psychosis. 6. Depression.  PROCEDURES: 1. EGD performed April 28, 2000, with esophageal stricture, hiatal hernia,    and gastroesophageal reflux disease. 2. Colonoscopy April 28, 2000, with colon diverticulosis as well as internal    and external hemorrhoids.  CONSULTATIONS: 1. Dr. Yancey Flemings, [facility]. 2. Dr. Jeanie Sewer, psychiatry.  HISTORY & PHYSICAL:  Please see that dictated on date of admission April 26, 2000.  HOSPITAL COURSE:  [name] is a 60 year old black female, under treatment for depression with Dr. Wynonia Lawman, who presented with microcytic, guaiac-negative, iron-deficiency anemia resulting in gastroenterologic consultation, procedures as above, and treatment with Prevacid and iron supplementation.  She also was seen by Dr. Jeanie Sewer with respect to her psychosis and depression with Risperdal in addition to the Effexor as on admission.  She was also treated with Tequin for urinary tract infection as well as bronchitis.  There was minor hypokalemia as well noted during hospitalization.  Her initial hemoglobin with respect to the diabetes was 7.8. She was status post two units of packed red blood cells.  Iron studies, depressed hemoglobin 9.5 prior to discharge.  No specific etiology except for the above as the etiology for her anemia from a gastroenterologic standpoint.  She did well postprocedure, seemed to respond to the Risperdal well.  She was ambulatory, eating well.  She was felt to have gained maximum benefit from this hospitalization and is  discharged to home.  DISPOSITION:  Discharged to home in good condition.  DISCHARGE MEDICATIONS: 1. Prevacid 30 mg p.o. q.d. 2. Iron sulfate 325 mg p.o. t.i.d. 3. Risperdal 0.5 mg p.o. q.d. 4. Effexor XR 150 mg p.o. q.d. 5. Tequin 400 mg p.o. q.d. for the next 4 days starting November 3 as she    has gotten 3 doses out of 7 while hospitalized.  FOLLOWUP:  She is to follow up with the psychiatrist and her primary physician in one to two weeks.  DIET:  Regular.  ACTIVITY:  No restrictions. DD:  04/29/00 TD:  04/29/00 Job: [id number]

## 2010-11-13 NOTE — Discharge Summary (Signed)
[facility]  Patient:    [name], [name]                    MRN: [id number] Adm. Date:  [id number] Disc. 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  The patient states "Lavenia Atlas had a psychotic breakdown."  Patient has been under the care of an M.D. since June 1995 for recurrent depression and suicidal ideation.  She has had recurrent psychiatric hospitalizations, with past hospitalizations at [facility].  The patient recently discovered her mother may be dying and shifted in depression.  She presents saying she is hallucinating, both auditory and visual hallucinations.  She is hearing the [facility] and the devil. These represent to her the forces of good and evil.  She has also been visualizing hell and heaven.  Given the magnitude of her psychosis and visualization, her hospitalization was supported on a voluntary basis.  Patient again has had multiple hospitalization.  PAST MEDICAL HISTORY:  Her primary care physician is Dr. Malva Limes.  She has a history of arthritis, low back pain.  Current medications:  Effexor 300 mg at h.s., Celebrex.  DRUG ALLERGIES:  No known drug allergies.  PHYSICAL EXAMINATION:  Patient was seen at [facility] ED for physical examination.  We also asked for metabolic panel.  There was no history of thalassemia, weight loss, tarry stools, and she is menopausal.  She was found to have microcytic anemia and she had her last physical two years ago.  LABORATORY DATA:  Her CBC with diff was basically abnormal.  White blood count decreased at 3.7, RBC decreased at 3.40, hemoglobin decreased at 7.8, hematocrit decreased at 24.7, MCV decreased at 32.6, MCHC decreased at 31.8, RDW high at 15.4, and platelets high at  448.  Her CMET was within normal limits with the exception of potassium that was low at 3.2.  Her T4 and TSH were within normal limits.  Her T3 uptake high at 41.6.  Urinalysis showed that she had some nitrates in her urine.  Esterase were moderate.  Many bacteria, and it was felt that she probably had a microcytic anemia.  MENTAL STATUS EXAMINATION:  On admission, patient presents as an interactive, neatly dressed lady who is overwrought according to history, cooperative, pleasant.  Speech normal in tone and flow, mood depressed and showed evidence of flat affect.  She admits to auditory and visual hallucinations. Denies any paranoia.  Cognitively intact and oriented x 3.  She is alert.  Short term and remote memory are intact.  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 Axis II:    No diagnosis. Axis III:   Anemia, to be further evaluated. Axis IV:    Severe, related to psychosocial problems. Axis V:     Current global assessment of function 30 on admission,             and 80 during past year.  HOSPITAL COURSE:  The patient was admitted to [facility] Unit.  She is to be established on Risperdal 0.5 in the morning and 1.5 mg h.s., Depakote ER 1000  mg at h.s., and Effexor XR dose was lowered to 150 q.d. Again, she was referred for evaluation of her anemia at [facility] emergency Room.  She was admitted to the stabilization program and maintained under standard 15 minute checks, and we ordered urinalysis, urine drug screen, CMET, thyroid profile, and CBC, asked her to engage in activities as tolerated, and allowed her to have p.r.n.s along with vital signs taken according to unit protocols and was to be followed daily for medication checks and psychotherapy, and to participate in individual and group cognitive behavioral therapy.  While on the unit, patient did fairly well.  On the second day, she reported  feeling better.  Auditory hallucinations had decreased.  Mood was less depressed, sleeping better.  Patient reports a long history of hemorrhoids.  We did check her CBC and CMET in the morning, and checked her Depakote level, and decided to continue Depakote, Effexor  and Risperdal.  We did have a consult for her anemia, and the recommendation was it was felt that she did have anemia and more laboratory work was done.  A GI consult for an endoscopic exam, also for hemorrhoids stool softeners and suppositories, and for hypokalemia add potassium.  After she was seen on October 30 by [facility], it was determined that she did need hospitalization at [facility] for GI workup and  she was transferred to [facility] [place] [facility] [place] on April 26, 2000 at 16:10.  Patient was in agreement with this treatment plan.  CONDITION ON DISCHARGE:  Patient was discharged really in improved condition, with  improvement in her mood, sleep, appetite and with decrease in her depression and sleeping and eating better.  No suicidal or homicidal ideation.  DISPOSITION:  Patient is transferred to [facility] and workup of her anemia and a GI consult.  FOLLOW UP:  The patient is to follow up as advised by her medical doctor at [facility].  DISCHARGE MEDICATIONS: 1. Depakote ER 1000 mg h.s. 2. Effexor XR 150 mg q.d. 3. Risperdal 1.5 mg h.s. and 0.5 mg in the morning.  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 Axis II:    No diagnosis. Axis III.   Microcytic anemia, GI consult to rule out any rectal bleeding.             Also she was found to have hemorrhoids and hypokalemia. Axis IV:    Moderate, secondary to medical problems. Axis V:     Current global assessment of function at discharge 50, highest in            past year 80. DD:  06/14/00 TD:  06/14/00 Job: [id number]

## 2010-11-13 NOTE — Discharge Summary (Signed)
[name], [name] NO.:  [id number]   MEDICAL RECORD NO.:  [id number]          PATIENT TYPE:  INP   LOCATION:  9318                          FACILITY:  [facility]   PHYSICIAN:  Malva Limes, M.D.    DATE OF BIRTH:  [birth year]   DATE OF ADMISSION:  07/18/2006  DATE OF DISCHARGE:  07/21/2006                               DISCHARGE SUMMARY   PRINCIPAL DISCHARGE DIAGNOSES:  1. Large dermoid cyst.  2. Extensive abdominal and pelvic adhesions.   PRINCIPAL PROCEDURES:  1. Total abdominal hysterectomy.  2. Left salpingo-oophorectomy.   HISTORY OF PRESENT ILLNESS:  [name] is a 60 year old female, G4,  P3-0-1-3 who presented to [facility] after a several month  history of worsening abdominal and pelvic pain. The patient was found to  have a 15-cm mass in her abdomen consistent with a dermoid cyst. A  complete description of the events which lead up this hospitalization  can be found in the dictated history and physical. The patient underwent  total abdominal hysterectomy with extensive lysis of adhesions on  July 18, 2006. A complete description of this can be found in  dictated operative note. The patient's postoperative course was  complicated by a low-grade temperature on postoperative day #1 and felt  to be atelectasis. This resolved spontaneously. The patient had a  hemoglobin of 10.5 prior to surgery and 7.4 after surgery. She remained  asymptomatic throughout this hospital course. At the time of discharge,  the patient was ambulating without difficulty. She was eating a regular  diet. She did have a bowel movement and flatus. The patient's incision  appeared to be healing well. The patient will be discharged to home. She  will be sent home with Percocet to take p.r.n. She will be followed up  in the office in one week. She will continue taking 1 iron daily for her  anemia. She will call the office if there was any drainage or erythema  of her  incision or if she develops a temperature.           ______________________________  Malva Limes, M.D.     MA/MEDQ  D:  07/21/2006  T:  07/21/2006  Job:  [id number]

## 2010-11-13 NOTE — Discharge Summary (Signed)
[facility]  Patient:    [name], [name]                    MRN: [id number] Adm. Date:  [id number] Disc. Date: [id number] Attending:  Tresa Garter Dictator:   Candi Leash. Orsini, N.P.                           Discharge Summary  NO DICTATION DD:  06/01/00 TD:  06/01/00 Job: [id number]

## 2011-03-18 ENCOUNTER — Emergency Department (HOSPITAL_COMMUNITY): Payer: BC Managed Care – PPO

## 2011-03-18 ENCOUNTER — Observation Stay (HOSPITAL_COMMUNITY)
Admission: EM | Admit: 2011-03-18 | Discharge: 2011-03-21 | Disposition: A | Discharge status: Home / Self Care | Payer: BC Managed Care – PPO | Attending: Internal Medicine | Admitting: Internal Medicine

## 2011-03-18 DIAGNOSIS — N390 Urinary tract infection, site not specified: Secondary | ICD-10-CM | POA: Insufficient documentation

## 2011-03-18 DIAGNOSIS — D509 Iron deficiency anemia, unspecified: Secondary | ICD-10-CM | POA: Insufficient documentation

## 2011-03-18 DIAGNOSIS — H538 Other visual disturbances: Principal | ICD-10-CM | POA: Insufficient documentation

## 2011-03-18 DIAGNOSIS — F319 Bipolar disorder, unspecified: Secondary | ICD-10-CM | POA: Insufficient documentation

## 2011-03-18 DIAGNOSIS — I951 Orthostatic hypotension: Secondary | ICD-10-CM | POA: Insufficient documentation

## 2011-03-18 LAB — CBC
HCT: 32.6 % — ABNORMAL LOW (ref 36.0–46.0)
Hemoglobin: 10.5 g/dL — ABNORMAL LOW (ref 12.0–15.0)
MCH: 24.4 pg — ABNORMAL LOW (ref 26.0–34.0)
MCHC: 32.2 g/dL (ref 30.0–36.0)
MCV: 75.8 fL — ABNORMAL LOW (ref 78.0–100.0)
Platelets: 382 10*3/uL (ref 150–400)
RBC: 4.3 MIL/uL (ref 3.87–5.11)
RDW: 18.3 % — ABNORMAL HIGH (ref 11.5–15.5)
WBC: 6.4 10*3/uL (ref 4.0–10.5)

## 2011-03-18 LAB — URINALYSIS, ROUTINE W REFLEX MICROSCOPIC
Bilirubin Urine: NEGATIVE
Glucose, UA: NEGATIVE mg/dL
Ketones, ur: NEGATIVE mg/dL
Nitrite: NEGATIVE
Protein, ur: NEGATIVE mg/dL
Specific Gravity, Urine: 1.011 (ref 1.005–1.030)
Urobilinogen, UA: 0.2 mg/dL (ref 0.0–1.0)
pH: 6 (ref 5.0–8.0)

## 2011-03-18 LAB — BASIC METABOLIC PANEL
BUN: 10 mg/dL (ref 6–23)
CO2: 26 mEq/L (ref 19–32)
Calcium: 9.8 mg/dL (ref 8.4–10.5)
Chloride: 107 mEq/L (ref 96–112)
Creatinine, Ser: 0.61 mg/dL (ref 0.50–1.10)
GFR calc Af Amer: >60 mL/min (ref >60)
GFR calc non Af Amer: >60 mL/min (ref >60)
Glucose, Bld: 113 mg/dL — ABNORMAL HIGH (ref 70–99)
Potassium: 3.3 mEq/L — ABNORMAL LOW (ref 3.5–5.1)
Sodium: 141 mEq/L (ref 135–145)

## 2011-03-18 LAB — DIFFERENTIAL
Basophils Absolute: 0 10*3/uL (ref 0.0–0.1)
Basophils Relative: 0 % (ref 0–1)
Eosinophils Absolute: 0.3 10*3/uL (ref 0.0–0.7)
Eosinophils Relative: 5 % (ref 0–5)
Lymphocytes Relative: 24 % (ref 12–46)
Lymphs Abs: 1.6 10*3/uL (ref 0.7–4.0)
Monocytes Absolute: 0.3 10*3/uL (ref 0.1–1.0)
Monocytes Relative: 5 % (ref 3–12)
Neutro Abs: 4.2 10*3/uL (ref 1.7–7.7)
Neutrophils Relative %: 66 % (ref 43–77)

## 2011-03-18 LAB — RAPID URINE DRUG SCREEN, HOSP PERFORMED
Amphetamines: NOT DETECTED
Barbiturates: NOT DETECTED
Benzodiazepines: NOT DETECTED
Cocaine: NOT DETECTED
Opiates: NOT DETECTED
Tetrahydrocannabinol: NOT DETECTED

## 2011-03-18 LAB — URINE MICROSCOPIC-ADD ON

## 2011-03-18 LAB — APTT: aPTT: 33 seconds (ref 24–37)

## 2011-03-18 LAB — PROTIME-INR
INR: 0.98 (ref 0.00–1.49)
Prothrombin Time: 13.2 seconds (ref 11.6–15.2)

## 2011-03-18 LAB — ETHANOL: Alcohol, Ethyl (B): <11 mg/dL (ref 0–11)

## 2011-03-19 ENCOUNTER — Emergency Department (HOSPITAL_COMMUNITY): Payer: BC Managed Care – PPO

## 2011-03-19 LAB — VITAMIN B12: Vitamin B-12: 673 pg/mL (ref 211–911)

## 2011-03-19 LAB — CARDIAC PANEL(CRET KIN+CKTOT+MB+TROPI)
CK, MB: 2.3 ng/mL (ref 0.3–4.0)
CK, MB: 2.2 ng/mL (ref 0.3–4.0)
Relative Index: INVALID (ref 0.0–2.5)
Relative Index: INVALID (ref 0.0–2.5)
Total CK: 83 U/L (ref 7–177)
Total CK: 84 U/L (ref 7–177)
Troponin I: <0.3 ng/mL (ref <0.30)
Troponin I: <0.3 ng/mL (ref <0.30)

## 2011-03-19 LAB — CBC
HCT: 30.6 % — ABNORMAL LOW (ref 36.0–46.0)
Hemoglobin: 9.5 g/dL — ABNORMAL LOW (ref 12.0–15.0)
MCH: 23.8 pg — ABNORMAL LOW (ref 26.0–34.0)
MCHC: 31 g/dL (ref 30.0–36.0)
MCV: 76.5 fL — ABNORMAL LOW (ref 78.0–100.0)
Platelets: 355 10*3/uL (ref 150–400)
RBC: 4 MIL/uL (ref 3.87–5.11)
RDW: 18.3 % — ABNORMAL HIGH (ref 11.5–15.5)
WBC: 4.6 10*3/uL (ref 4.0–10.5)

## 2011-03-19 LAB — TSH
TSH: 2.388 u[IU]/mL (ref 0.350–4.500)
TSH: 1.642 u[IU]/mL (ref 0.350–4.500)

## 2011-03-19 LAB — LIPID PANEL
Cholesterol: 188 mg/dL (ref 0–200)
HDL: 39 mg/dL — ABNORMAL LOW (ref >39)
LDL Cholesterol: 127 mg/dL — ABNORMAL HIGH (ref 0–99)
Total CHOL/HDL Ratio: 4.8 RATIO
Triglycerides: 111 mg/dL (ref <150)
VLDL: 22 mg/dL (ref 0–40)

## 2011-03-19 LAB — URINE CULTURE
Colony Count: 7000
Culture  Setup Time: 201209202114

## 2011-03-19 LAB — COMPREHENSIVE METABOLIC PANEL
ALT: 12 U/L (ref 0–35)
AST: 13 U/L (ref 0–37)
Albumin: 3.4 g/dL — ABNORMAL LOW (ref 3.5–5.2)
Alkaline Phosphatase: 67 U/L (ref 39–117)
BUN: 8 mg/dL (ref 6–23)
CO2: 24 mEq/L (ref 19–32)
Calcium: 8.9 mg/dL (ref 8.4–10.5)
Chloride: 108 mEq/L (ref 96–112)
Creatinine, Ser: 0.61 mg/dL (ref 0.50–1.10)
GFR calc Af Amer: >60 mL/min (ref >60)
GFR calc non Af Amer: >60 mL/min (ref >60)
Glucose, Bld: 137 mg/dL — ABNORMAL HIGH (ref 70–99)
Potassium: 3.6 mEq/L (ref 3.5–5.1)
Sodium: 141 mEq/L (ref 135–145)
Total Bilirubin: 0.6 mg/dL (ref 0.3–1.2)
Total Protein: 6.4 g/dL (ref 6.0–8.3)

## 2011-03-19 LAB — MAGNESIUM: Magnesium: 1.9 mg/dL (ref 1.5–2.5)

## 2011-03-19 LAB — FERRITIN: Ferritin: 12 ng/mL (ref 10–291)

## 2011-03-19 LAB — CK TOTAL AND CKMB (NOT AT ARMC)
CK, MB: 2.2 ng/mL (ref 0.3–4.0)
Relative Index: INVALID (ref 0.0–2.5)
Total CK: 87 U/L (ref 7–177)

## 2011-03-19 LAB — DIFFERENTIAL
Basophils Absolute: 0 10*3/uL (ref 0.0–0.1)
Basophils Relative: 0 % (ref 0–1)
Eosinophils Absolute: 0.2 10*3/uL (ref 0.0–0.7)
Eosinophils Relative: 5 % (ref 0–5)
Lymphocytes Relative: 24 % (ref 12–46)
Lymphs Abs: 1.1 10*3/uL (ref 0.7–4.0)
Monocytes Absolute: 0.2 10*3/uL (ref 0.1–1.0)
Monocytes Relative: 5 % (ref 3–12)
Neutro Abs: 3 10*3/uL (ref 1.7–7.7)
Neutrophils Relative %: 66 % (ref 43–77)

## 2011-03-19 LAB — IRON AND TIBC
Iron: 33 ug/dL — ABNORMAL LOW (ref 42–135)
Saturation Ratios: 9 % — ABNORMAL LOW (ref 20–55)
TIBC: 386 ug/dL (ref 250–470)
UIBC: 353 ug/dL (ref 125–400)

## 2011-03-19 LAB — HEMOGLOBIN A1C
Hgb A1c MFr Bld: 6.2 % — ABNORMAL HIGH (ref <5.7)
Mean Plasma Glucose: 131 mg/dL — ABNORMAL HIGH (ref <117)

## 2011-03-19 LAB — FOLATE: Folate: 13.6 ng/mL

## 2011-03-19 LAB — ANGIOTENSIN CONVERTING ENZYME: Angiotensin-Converting Enzyme: 43 U/L (ref 8–52)

## 2011-03-19 LAB — LITHIUM LEVEL: Lithium Lvl: <0.25 mEq/L — ABNORMAL LOW (ref 0.80–1.40)

## 2011-03-19 LAB — SEDIMENTATION RATE: Sed Rate: 9 mm/hr (ref 0–22)

## 2011-03-19 LAB — RPR: RPR Ser Ql: NONREACTIVE

## 2011-03-19 LAB — TROPONIN I: Troponin I: <0.3 ng/mL (ref <0.30)

## 2011-03-21 LAB — CBC
HCT: 33.2 % — ABNORMAL LOW (ref 36.0–46.0)
Hemoglobin: 10.6 g/dL — ABNORMAL LOW (ref 12.0–15.0)
MCH: 24.6 pg — ABNORMAL LOW (ref 26.0–34.0)
MCHC: 31.9 g/dL (ref 30.0–36.0)
MCV: 77 fL — ABNORMAL LOW (ref 78.0–100.0)
Platelets: 360 K/uL (ref 150–400)
RBC: 4.31 MIL/uL (ref 3.87–5.11)
RDW: 18.4 % — ABNORMAL HIGH (ref 11.5–15.5)
WBC: 5 K/uL (ref 4.0–10.5)

## 2011-03-21 LAB — BASIC METABOLIC PANEL
BUN: 9 mg/dL (ref 6–23)
CO2: 24 mEq/L (ref 19–32)
Calcium: 9.2 mg/dL (ref 8.4–10.5)
Chloride: 107 mEq/L (ref 96–112)
Creatinine, Ser: 0.65 mg/dL (ref 0.50–1.10)
GFR calc Af Amer: >60 mL/min (ref >60)
GFR calc non Af Amer: >60 mL/min (ref >60)
Glucose, Bld: 117 mg/dL — ABNORMAL HIGH (ref 70–99)
Potassium: 3.5 mEq/L (ref 3.5–5.1)
Sodium: 141 mEq/L (ref 135–145)

## 2011-03-28 NOTE — H&P (Signed)
NAMEMarland Kitchen  YUVONNE, LANAHAN NO.:  [id number]  MEDICAL RECORD NO.:  [id number]  LOCATION:  [facility]                         FACILITY:  [facility]  PHYSICIAN:  Eduard Clos, MDDATE OF BIRTH:  06/20/1951  DATE OF ADMISSION:  03/18/2011 DATE OF DISCHARGE:                             HISTORY & PHYSICAL   PRIMARY CARE PHYSICIAN:  Fleet Contras, MD  CHIEF COMPLAINT:  Dizziness and blurred vision.  HISTORY OF PRESENTING ILLNESS:  A 60 year old female with history of bipolar disorder has been suddenly experiencing dizziness and blurred vision.  The blurred vision happened to left eye.  Initially, it was blurred and after that she started seeing some floater and she sees sometimes things double when she opens both eyes.  She has no power in the right eye, and she feels dizzy whenever she stands and tries to walk.  Denies any focal deficit.  Denies any headache.  Denies any nausea, vomiting, abdominal pain, any fever, chills, cough or phlegm. In the ER, the patient had an MRI of the brain and I discussed with the radiologist who says that there is nothing acute at this time.  We need to follow the outpatient results.  The patient states that she still feels blurred in the left eye, but she is able to count the fingers. She was taking lithium for her bipolar disorder.  The lithium levels are undetectable at this time.  The patient will be admit for further workup.  The patient denies any chest pain, short of breath, nausea, vomiting, abdominal pain.  Denies any dysuria, discharge, or diarrhea.  Denies any focal deficit.  PAST MEDICAL HISTORY:  Bipolar disorder.  PAST SURGICAL HISTORY:  Ankle surgery.  MEDICATIONS ON ADMISSION: 1. Cymbalta 30 mg p.o. daily. 2. Lithium carbonate 900 mg at bedtime.  FAMILY HISTORY:  Positive for stroke in her grandmother.  SOCIAL HISTORY:  The patient is married.  Denies smoking cigarette, drinking alcohol, or using illegal  drugs.  REVIEW OF SYSTEMS:  As per pertinent history of presenting illness. Nothing else significant.  PHYSICAL EXAMINATION:  GENERAL:  The patient examined at bedside, not in acute distress. VITAL SIGNS:  Blood pressure 120/94, pulse 98 per minute, temperature 97, respirations 18 per minute, O2 sat 100%. HEENT:  Anicteric.  No pallor.  The patient is able to count with both eyes.  PLA positive.  There is no facial asymmetry.  The patient is trying to close her left eye more often, but she does not have any definite ptosis at this time.  There is no neck mass or any obvious mass in her face.  Tongue is midline.  No facial asymmetry.  No discharge from ears, eyes, nose or mouth. NECK:  No neck rigidity. CHEST:  Bilateral air entry present.  No rhonchi.  No crepitation. HEART:  S1 and S2 heard. ABDOMEN:  Soft, nontender.  Bowel sounds heard. CNS:  The patient is alert, awake, and oriented to time, place, and person.  Moves upper and lower extremities, 5/5.  There is no pronator drift.  No dysdiadochokinesia.  No ataxia. EXTREMITIES:  Peripheral pulses felt.  No acute ischemic changes, cyanosis, or clubbing.  LABORATORY DATA:  EKG shows normal  sinus rhythm with heart rate is around 86 beats per minute with nonspecific ST-T changes, QTc is around 502 milliseconds.  Chest x-ray shows no evidence of acute cardiopulmonary disease.  MRI of the brain, I discussed with radiologist, who says nothing acute.  We need to follow the official result.  CBC:  WBC 6.4, hemoglobin 10.5, hematocrit 32.6, platelets 382. PT/INR 13.2 and 0.98.  Complete metabolic panel:  Sodium 141, potassium 3.3, chloride 107, carbon dioxide 26, glucose 130, BUN 10, creatinine 0.6, calcium 9.8, lithium level less than 0.25.  Drug screen is negative.  UA is showing cloudy, large leukocytes, wbc's too numerous to count, bacteria few.  ASSESSMENT: 1. Dizziness with blurred vision on the left side with normal MRI of      the brain for further workup. 2. Urinary tract infection. 3. Hypochromic microcytic anemia. 4. Bipolar disorder.  PLAN: 1. At this time, we will admit the patient to telemetry. 2. For her dizziness at this time, I have consulted Dr. Anne Hahn,     neurologist, who is going to see the patient.  We will follow the     recommendation.  We will closely observe the patient in telemetry.     We will get PT/OT consult and further recommendation will be based     on the neurologist's consult. 3. Microcytic hypochromic anemia.  At this time, we will check anemia     panel.  We will also check stool for occult blood.  The patient has     chronic anemia.  The patient will definitely need anemia workup at     least as outpatient.  At this time, we are going to recheck the CBC     again in the a.m. 4. For UTI, we will keep the patient on ciprofloxacin and we will get     a urine culture and sensitivity. 5. For bipolar disorder, we will continue present medication.  Her     lithium is undetectable at this time. 6. Further recommendation as condition evolves.     Eduard Clos, MD     ANK/MEDQ  D:  03/19/2011  T:  03/19/2011  Job:  [id number]  cc:   Fleet Contras, M.D.  Electronically Signed by Midge Minium MD on 03/28/2011 12:00:19 PM

## 2011-03-31 LAB — NEUROMYELITIS OPTICA AUTOAB, IGG: NMO-IgG: <1.6

## 2011-04-06 LAB — BASIC METABOLIC PANEL
BUN: 12
CO2: 25
Calcium: 8.9
Chloride: 111
Creatinine, Ser: 0.78
GFR calc Af Amer: >60
GFR calc non Af Amer: >60
Glucose, Bld: 98
Potassium: 3.8
Sodium: 143

## 2011-04-06 NOTE — Consult Note (Signed)
NAME:  [name], [name] NO.:  [id number]  MEDICAL RECORD NO.:  [id number]  LOCATION:  [facility]                         FACILITY:  [facility]  PHYSICIAN:  Marlan Palau, M.D.  DATE OF BIRTH:  08/03/1950  DATE OF CONSULTATION:  03/19/2011 DATE OF DISCHARGE:                                CONSULTATION   HISTORY OF PRESENT ILLNESS:  [name] is a 60 year old right- handed black female born on October 23, 1950.  This patient presents with sudden onset this afternoon of left eye monocular visual field changes. The patient noted a "fragmented vision" in the left eye only with normal vision in the right eye.  When using both eyes, the patient feels dizzy, imbalanced.  This patient denies any headache, numbness, or weakness on the face, arms, or legs, syncope, or problems with falls.  The patient clinically has been noted to have some nystagmus affect in both eyes with some evidence of downbeat nystagmus.  This patient was sent for an MRI of the brain that did not show acute changes, and there is no evidence of cerebrovascular disease or evidence of an acute stroke.  The patient has been on lithium, but lithium levels are less than 0.25. Neurology was asked to see this patient for further evaluation.  At this point, the patient continues to have persistent changes in vision from eye to the next.  With the left eye, the patient reports a fogginess of vision.  The fragmented vision has improved.  The vision in the right eye remains normal.  The patient denies any ocular pain on either side.  PAST MEDICAL HISTORY:  Significant for: 1. New-onset of monocular visual field changes involving the left eye,     nystagmus.  On clinical examination, MRI is negative. 2. Bipolar disorder. 3. Hypertension. 4. Right ankle fracture requiring surgery. 5. Hysterectomy. 6. Ovarian cyst resection. 7. Tonsillectomy. 8. Appendectomy. 9. Bilateral tubal ligation.  The patient is on  Cymbalta 30 mg daily, Lithobid 300 mg 3 times daily.  The patient has allergy to PENICILLIN, which results in rash.  Does not smoke or drink.  SOCIAL HISTORY:  This patient is married, lives in the [place] area, has three children, one daughter has bipolar disorder. The patient does work.  FAMILY MEDICAL HISTORY:  The mother is still living, has history of stroke.  Father has a history of degenerative arthritis, hypertension, and obesity, and is still living.  The patient has one brother and one sister.  The sisters has a history of breast cancer.  REVIEW OF SYSTEMS:  Notable for no recent fevers, chills.  The patient denies any weight loss, malaise prior to onset of the symptoms above. The patient has no shortness of breath, chest pain, abdominal pain, nausea, vomiting, troubles controlling bowels or bladder.  No syncope. No headache is noted or confusion.  PHYSICAL EXAMINATION:  VITAL SIGNS:  Blood pressure is 151/94, heart rate 91, respiratory rate 18, temperature afebrile. GENERAL:  This patient is a fairly well-developed black female who is alert and cooperative at the time examination. HEENT:  Head is atraumatic.  Eyes, pupils are equal, round, and reactive to light.  Disks are flat.  The sclera on the left eye appears to be somewhat read as compared the right. NECK:  Supple.  No carotid bruits noted. RESPIRATORY:  Clear. CARDIOVASCULAR:  Regular rate and rhythm.  No obvious murmurs, rubs noted. EXTREMITIES:  Without significant edema. NEUROLOGIC:  Cranial nerves as above.  Facial symmetry is present.  The patient appears to have relatively full extraocular movements.  The patient squints left eye, however.  The patient appears to have some coarse occasional horizontal but on primary gaze, appears to have some downbeat nystagmus involving both eyes.  The patient has good pinprick sensation on the face.  Good strength in the facial muscles, and  the muscles of the head turn, shoulder shrug bilaterally.  Motor testing reveals 5/5 strength in all fours.  Good symmetric motor tone is noted throughout.  Pinprick, soft touch, vibratory sensation is noted on the arm and legs is symmetric.  The patient has good finger-nose-finger, heel-to-shin bilaterally.  Gait was not tested.  Deep tendon reflexes symmetric and normal.  Toes are downgoing bilaterally.  No drift is seen in the arms or legs.  Laboratory values notable for a white count of 6.4, hemoglobin of 10.5, hematocrit of 30.6, MCV of 75.8, platelets of 382, INR of 0.98.  Sodium 141, potassium 3.3, chloride of 107, CO2 of 26, glucose of 113, BUN of 10, creatinine 0.61, calcium 9.8.  Urine drug screen is negative. Lithium is less than 0.25.  Urinalysis reveals specific gravity of 0.011, pH 6.0, otherwise the patient has too numerous to count white cells in the urine, 0-2 red cells.  MRI of the head is unremarkable.  IMPRESSION: 1. New onset of monocular visual field change left eye. 2. New-onset nystagmus with downbeat nystagmus. 3. Bipolar disorder.  This patient is on lithium but the levels are low.  At any rate, lithium has a multitude of central nervous system side effects, and I would discontinue this medication at this time.  MRI scan of the brain shows no evidence of a stroke.  Clinical examination reveals evidence of monocular visual field changes affecting the left eye only, but also the patient has downbeat nystagmus suggesting a multifocal problem within the central nervous system.  We will check blood work to include NMO antibodies, angiotensin-converting enzyme levels, thyroid profile, B12 level, sed rate, and hemoglobin A1c level.  We would check a visually evoked response as an outpatient and if the visual field changes persist, Ophthalmology should see this patient.  The patient will have physical therapy for gait.  We will follow the patient's clinical  course while in-house.     Marlan Palau, M.D.     CKW/MEDQ  D:  03/19/2011  T:  03/19/2011  Job:  [id number]  cc:   [facility]  Electronically Signed by Thana Farr M.D. on 04/06/2011 81:19:14 PM

## 2011-04-07 LAB — RAPID URINE DRUG SCREEN, HOSP PERFORMED
Amphetamines: NOT DETECTED
Barbiturates: NOT DETECTED
Benzodiazepines: NOT DETECTED
Cocaine: NOT DETECTED
Opiates: NOT DETECTED
Tetrahydrocannabinol: NOT DETECTED

## 2011-04-07 LAB — DIFFERENTIAL
Basophils Absolute: 0
Basophils Absolute: 0
Basophils Relative: 0
Basophils Relative: 1
Eosinophils Absolute: 0
Eosinophils Absolute: 0
Eosinophils Relative: 1
Eosinophils Relative: 1
Lymphocytes Relative: 16
Lymphocytes Relative: 17
Lymphs Abs: 0.4 — ABNORMAL LOW
Lymphs Abs: 0.9
Monocytes Absolute: 0.2
Monocytes Absolute: 0.4
Monocytes Relative: 7
Monocytes Relative: 9
Neutro Abs: 2
Neutro Abs: 4.1
Neutrophils Relative %: 74
Neutrophils Relative %: 76

## 2011-04-07 LAB — COMPREHENSIVE METABOLIC PANEL
ALT: 10
ALT: 11
AST: 15
AST: 19
Albumin: 3.1 — ABNORMAL LOW
Albumin: 3.1 — ABNORMAL LOW
Alkaline Phosphatase: 47
Alkaline Phosphatase: 50
BUN: 2 — ABNORMAL LOW
BUN: 2 — ABNORMAL LOW
CO2: 25
CO2: 27
Calcium: 8.2 — ABNORMAL LOW
Calcium: 8.7
Chloride: 107
Chloride: 108
Creatinine, Ser: 0.7
Creatinine, Ser: 0.7
GFR calc Af Amer: >60
GFR calc Af Amer: >60
GFR calc non Af Amer: >60
GFR calc non Af Amer: >60
Glucose, Bld: 120 — ABNORMAL HIGH
Glucose, Bld: 87
Potassium: 2.5 — CL
Potassium: 3.1 — ABNORMAL LOW
Sodium: 139
Sodium: 139
Total Bilirubin: 0.9
Total Bilirubin: 1.1
Total Protein: 5.3 — ABNORMAL LOW
Total Protein: 6

## 2011-04-07 LAB — BASIC METABOLIC PANEL
BUN: 5 — ABNORMAL LOW
CO2: 25
Calcium: 8.1 — ABNORMAL LOW
Chloride: 103
Creatinine, Ser: 0.64
GFR calc Af Amer: >60
GFR calc non Af Amer: >60
Glucose, Bld: 98
Potassium: 3.1 — ABNORMAL LOW
Sodium: 136

## 2011-04-07 LAB — CBC
HCT: 27.9 — ABNORMAL LOW
HCT: 28.7 — ABNORMAL LOW
Hemoglobin: 9 — ABNORMAL LOW
Hemoglobin: 9.3 — ABNORMAL LOW
MCHC: 32.2
MCHC: 32.5
MCV: 70.1 — ABNORMAL LOW
MCV: 71.5 — ABNORMAL LOW
Platelets: 319
Platelets: 369
RBC: 3.9
RBC: 4.09
RDW: 18 — ABNORMAL HIGH
RDW: 18.1 — ABNORMAL HIGH
WBC: 2.7 — ABNORMAL LOW
WBC: 5.5

## 2011-04-07 LAB — POCT CARDIAC MARKERS
CKMB, poc: <1 — ABNORMAL LOW
CKMB, poc: <1 — ABNORMAL LOW
Myoglobin, poc: 42.9
Myoglobin, poc: 50.2
Operator id: 4708
Operator id: 4708
Troponin i, poc: <0.05
Troponin i, poc: <0.05

## 2011-04-07 LAB — RETICULOCYTES
RBC.: 4.3
Retic Count, Absolute: 34.4
Retic Ct Pct: 0.8

## 2011-04-07 LAB — FERRITIN: Ferritin: 12 (ref 10–291)

## 2011-04-07 LAB — ACETAMINOPHEN LEVEL: Acetaminophen (Tylenol), Serum: <10 — ABNORMAL LOW

## 2011-04-07 LAB — URINALYSIS, ROUTINE W REFLEX MICROSCOPIC
Bilirubin Urine: NEGATIVE
Glucose, UA: NEGATIVE
Hgb urine dipstick: NEGATIVE
Ketones, ur: NEGATIVE
Nitrite: NEGATIVE
Protein, ur: NEGATIVE
Specific Gravity, Urine: 1.016
Urobilinogen, UA: 0.2
pH: 6

## 2011-04-07 LAB — VITAMIN B12: Vitamin B-12: 657 (ref 211–911)

## 2011-04-07 LAB — CARDIAC PANEL(CRET KIN+CKTOT+MB+TROPI)
CK, MB: 3.8
CK, MB: 4.4 — ABNORMAL HIGH
Relative Index: 0.7
Relative Index: 0.8
Total CK: 512 — ABNORMAL HIGH
Total CK: 577 — ABNORMAL HIGH
Troponin I: 0.01
Troponin I: 0.02

## 2011-04-07 LAB — IRON AND TIBC
Iron: 21 — ABNORMAL LOW
Saturation Ratios: 5 — ABNORMAL LOW
TIBC: 418
UIBC: 397

## 2011-04-07 LAB — FOLATE: Folate: 8.8

## 2011-04-07 LAB — PROTIME-INR
INR: 1.1
Prothrombin Time: 14.1

## 2011-04-07 LAB — CK TOTAL AND CKMB (NOT AT ARMC)
CK, MB: 1.1
Relative Index: INVALID
Total CK: 97

## 2011-04-07 LAB — ETHANOL: Alcohol, Ethyl (B): <5

## 2011-04-07 LAB — SALICYLATE LEVEL: Salicylate Lvl: <4

## 2011-04-07 LAB — TROPONIN I: Troponin I: <0.01

## 2011-04-07 LAB — APTT: aPTT: 29

## 2011-04-08 LAB — T3, FREE: T3, Free: 2.9 (ref 2.3–4.2)

## 2011-04-08 LAB — COMPREHENSIVE METABOLIC PANEL
ALT: 11
AST: 13
Albumin: 3.6
Alkaline Phosphatase: 51
BUN: 7
CO2: 27
Calcium: 8.9
Chloride: 104
Creatinine, Ser: 0.8
GFR calc Af Amer: >60
GFR calc non Af Amer: >60
Glucose, Bld: 121 — ABNORMAL HIGH
Potassium: 3.2 — ABNORMAL LOW
Sodium: 138
Total Bilirubin: 0.7
Total Protein: 6.4

## 2011-04-08 LAB — URINALYSIS, ROUTINE W REFLEX MICROSCOPIC
Bilirubin Urine: NEGATIVE
Glucose, UA: NEGATIVE
Hgb urine dipstick: NEGATIVE
Ketones, ur: NEGATIVE
Nitrite: NEGATIVE
Protein, ur: NEGATIVE
Specific Gravity, Urine: 1.009
Urobilinogen, UA: 0.2
pH: 7

## 2011-04-08 LAB — CBC
HCT: 29.4 — ABNORMAL LOW
Hemoglobin: 9.3 — ABNORMAL LOW
MCHC: 31.5
MCV: 72.3 — ABNORMAL LOW
Platelets: 339
RBC: 4.06
RDW: 16.8 — ABNORMAL HIGH
WBC: 4.3

## 2011-04-08 LAB — TSH: TSH: 1.767

## 2011-04-08 LAB — T4, FREE: Free T4: 1.28

## 2011-05-01 NOTE — Discharge Summary (Signed)
NAMEMarland [name]  LADASHIA, Brandi NO.:  [id number]  MEDICAL RECORD NO.:  [id number]  LOCATION:  3707                         FACILITY:  [facility]  PHYSICIAN:  Altha Harm, MDDATE OF BIRTH:  09/13/1950  DATE OF ADMISSION:  03/18/2011 DATE OF DISCHARGE:  03/21/2011                              DISCHARGE SUMMARY   PRIMARY CARE PHYSICIAN:  Brandi Contras, MD  DISCHARGE DIAGNOSES: 1. Blurry vision - unclear etiology, improved during the     hospitalization. 2. Dizziness - secondary to orthostatic hypotension, resolved upon     discharge. 3. Urinary tract infection. 4. Microcytic anemia. 5. Bipolar disorder.  DISCHARGE MEDICATIONS: 1. Ciprofloxacin 500 mg tablet twice daily by mouth to take for 5     additional days. 2. Aspirin by mouth 81 mg tablet every day once daily. 3. Cymbalta 30 mg tablet daily. 4. Lithium 300 mg tablet, take 2 tablets in the morning and 1 tablet     at nighttime.5. Multivitamin 1 tablet daily.  DISPOSITION AND FOLLOWUP:  The patient was discharged from the hospital in stable condition with dizziness and blurry vision resolved.  She will follow up with [facility].  The patient was provided number, [phone] and that she will make appointment herself.  In addition, she was advised to see ophthalmologist and was provided phone number for eye doctor, [phone] and the patient reported she will contact them to schedule an appointment.  CONSULTATIONS:  Neurology.  DIAGNOSTIC TESTS:  March 19, 2011, chest x-ray no evidence of acute cardiopulmonary disease.  MRI of the brain without contrast March 19, 2011, no significant abnormality, image quality degraded by motion.  HISTORY OF PRESENT ILLNESS:  The patient is a 60 year old female with history of bipolar disorder that has suddenly experienced episode of dizziness and blurry vision on the left eye.  Initially the vision was blurred, but she was also seeing floaters and  would experience double vision as well, which was intermittent.  The patient denies fevers and chills, denied abdominal or urinary concerns, denied headaches or history of similar problems in the past.  She also denied nausea, vomiting, chest pain, or shortness of breath.  PHYSICAL EXAMINATION:  VITAL SIGNS:  Temperature 98.5, respirations 18, heart rate 85, blood pressure 115/75, saturating 96% on room air. GENERAL:  The patient is sitting in bed, not in acute distress, follows commands appropriately. CARDIOVASCULAR:  Regular rate rhythm, S1 and S2 present.  No murmurs, rubs, or gallops. LUNGS:  Clear to auscultation bilaterally.  No wheeze, rhonchi, or rales. ABDOMEN:  Soft, nontender, nondistended.  Bowel sounds present. EXTREMITIES:  No edema or cyanosis. NEUROLOGIC:  Grossly nonfocal.  BLOOD WORK:  Sodium 141, potassium 3.5, chloride 107, bicarb 24, BUN 9, creatinine 0.65, glucose 117.  WBC 5, hemoglobin 10.6, hematocrit 33.2, platelet 316.  HOSPITAL COURSE BY PROBLEM: 1. Dizziness.  This was determined to be secondary to orthostatic     hypotension.  The patient was slightly orthostatic from lying to     standing position.  She was provided IV fluids and her dizziness     resolved.  Upon discharge, she denied any dizziness and was able to     walk without assistance.  2. Blurry vision - this was determined to be of unclear etiology, but     has resolved during the hospitalization.  The patient was advised     to follow up in outpatient setting with Ophthalmology as well as     Neurology for visual evoke potential test. 3. Urinary tract infection - this was treated in hospital.  The     patient will continue taking ciprofloxacin 5000 mg tablet twice     daily for additional 5 days. 4. Anemia.  This has been stable during the hospitalization.  Baseline     hemoglobin and hematocrit have remained around 10/32 respectively.  Over 30 minutes was spent on discharging the  patient.     Brandi Sax, MD   ______________________________ Altha Harm, MD    IM/MEDQ  D:  03/21/2011  T:  03/21/2011  Job:  [id number]  cc:   [name], M.D.  Electronically Signed by Brandi Sax MD on 04/08/2011 08:58:08 AM Electronically Signed by Brandi Presto MD on 05/01/2011 10:42:27 PM

## 2015-02-27 ENCOUNTER — Encounter: Payer: Self-pay | Admitting: Internal Medicine

## 2015-03-04 ENCOUNTER — Other Ambulatory Visit: Payer: Self-pay | Admitting: Internal Medicine

## 2015-03-04 DIAGNOSIS — N6002 Solitary cyst of left breast: Secondary | ICD-10-CM

## 2015-03-04 DIAGNOSIS — N6321 Unspecified lump in the left breast, upper outer quadrant: Secondary | ICD-10-CM

## 2015-04-01 ENCOUNTER — Other Ambulatory Visit: Payer: Self-pay | Admitting: Surgery

## 2015-04-01 DIAGNOSIS — D241 Benign neoplasm of right breast: Secondary | ICD-10-CM

## 2015-04-25 ENCOUNTER — Telehealth: Payer: Self-pay

## 2015-04-25 ENCOUNTER — Encounter: Payer: Self-pay | Admitting: Internal Medicine

## 2015-04-25 ENCOUNTER — Ambulatory Visit (AMBULATORY_SURGERY_CENTER): Payer: Self-pay

## 2015-04-25 VITALS — Ht 68.0 in | Wt 184.0 lb

## 2015-04-25 DIAGNOSIS — Z1211 Encounter for screening for malignant neoplasm of colon: Secondary | ICD-10-CM

## 2015-04-25 NOTE — Telephone Encounter (Signed)
Years ago pt had JP, now wants CG.  OK with you both?  Thanks, [name]/PV

## 2015-04-25 NOTE — Progress Notes (Signed)
No allergies to eggs or soy No past problems with anesthesia No home oxygen No diet/weight loss meds  Has email; refused emmi has had before

## 2015-04-25 NOTE — Telephone Encounter (Signed)
ok 

## 2015-04-25 NOTE — Telephone Encounter (Signed)
Sure

## 2015-04-29 ENCOUNTER — Encounter (HOSPITAL_BASED_OUTPATIENT_CLINIC_OR_DEPARTMENT_OTHER): Payer: Self-pay | Admitting: *Deleted

## 2015-05-05 NOTE — H&P (Signed)
[name]. [name]  Location: [facility] Patient #: [id number] DOB: [birth year] Married / Language: English / Race: Black or African American Female   History of Present Illness (Brandi Christian A. [name] MD;  Patient words: right breast.  The patient is a 64 year old female who presents with a complaint of Breast problems. This pleasant patient is referred by Dr. [name] for evaluation of a right breast papilloma. This was identified on recent screening mammography and ultrasound. A biopsy was performed showing intraductal papilloma. 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 She has had no previous history of breast biopsies. Her family history is significant for a sister who had breast cancer. She is uncertain of the treatment she received that [place] is [place]. She is otherwise healthy without complaints. She denies any nipple discharge.   Other Problems Lamar Laundry [name], CMA; Anxiety Disorder Depression Hemorrhoids Oophorectomy  Past Surgical History Lamar Laundry [name], CMA;  Appendectomy Breast Biopsy Right. Cataract Surgery Bilateral. Hysterectomy (not due to cancer) - Complete Tonsillectomy  Diagnostic Studies History [name], CMA;  Colonoscopy >10 years ago Mammogram within last year Pap Smear >5 years ago  Allergies [name], CMA;  Penicillamine *ASSORTED CLASSES*  Medication History ([name], CMA;  Lithium Carbonate (300MG  Capsule, Oral) Active. Vitamin D (Ergocalciferol) (50000UNIT Capsule, Oral) Active. DULoxetine HCl (60MG  Capsule DR Part, Oral) Active. Medications Reconciled  Social History Lamar Laundry [name], CMA; Caffeine use Carbonated beverages, Tea. No alcohol use No drug use Tobacco use Never smoker.  Family History [name], CMA;  Alcohol Abuse Father. Arthritis Father, Mother. Breast Cancer Sister. Cerebrovascular Accident Mother. Hypertension Father. Thyroid  problems Mother.  Pregnancy / Birth History [name], CMA;  Age at menarche 10 years. Age of menopause 18-60 Gravida 4 Maternal age 63-25 Para 3    Review of Systems [name] CMA; General Present- Fatigue. Not Present- Appetite Loss, Chills, Fever, Night Sweats, Weight Gain and Weight Loss. Skin Not Present- Change in Wart/Mole, Dryness, Hives, Jaundice, New Lesions, Non-Healing Wounds, Rash and Ulcer. HEENT Present- Wears glasses/contact lenses. Not Present- Earache, Hearing Loss, Hoarseness, Nose Bleed, Oral Ulcers, Ringing in the Ears, Seasonal Allergies, Sinus Pain, Sore Throat, Visual Disturbances and Yellow Eyes. Respiratory Not Present- Bloody sputum, Chronic Cough, Difficulty Breathing, Snoring and Wheezing. Breast Present- Breast Mass. Not Present- Breast Pain, Nipple Discharge and Skin Changes. Cardiovascular Not Present- Chest Pain, Difficulty Breathing Lying Down, Leg Cramps, Palpitations, Rapid Heart Rate, Shortness of Breath and Swelling of Extremities. Gastrointestinal Present- Hemorrhoids. Not Present- Abdominal Pain, Bloating, Bloody Stool, Change in Bowel Habits, Chronic diarrhea, Constipation, Difficulty Swallowing, Excessive gas, Gets full quickly at meals, Indigestion, Nausea, Rectal Pain and Vomiting. Female Genitourinary Not Present- Frequency, Nocturia, Painful Urination, Pelvic Pain and Urgency. Musculoskeletal Not Present- Back Pain, Joint Pain, Joint Stiffness, Muscle Pain, Muscle Weakness and Swelling of Extremities. Neurological Not Present- Decreased Memory, Fainting, Headaches, Numbness, Seizures, Tingling, Tremor, Trouble walking and Weakness. Psychiatric Present- Bipolar. Not Present- Anxiety, Change in Sleep Pattern, Depression, Fearful and Frequent crying. Endocrine Not Present- Cold Intolerance, Excessive Hunger, Hair Changes, Heat Intolerance, Hot flashes and New Diabetes. Hematology Not Present- Easy Bruising, Excessive bleeding, Gland  problems, HIV and Persistent Infections.  Vitals ([name] CMA;  Weight: 180 lb Height: 68in Body Surface Area: 1.98 m Body Mass Index: 27.37 kg/m  Temp.: 30F(Temporal)  Pulse: 79 (Regular)  BP: 132/82 (Sitting, Left Arm, Standard)     Physical Exam (Braylin Xu A. [name] MD;  General Mental Status-Alert.  General Appearance-Consistent with stated age. Hydration-Well hydrated. Voice-Normal.  Head and Neck Head-normocephalic, atraumatic with no lesions or palpable masses. Trachea-midline. Thyroid Gland Characteristics - normal size and consistency.  Eye Eyeball - Bilateral-Extraocular movements intact. Sclera/Conjunctiva - Bilateral-No scleral icterus.  Chest and Lung Exam Chest and lung exam reveals -quiet, even and easy respiratory effort with no use of accessory muscles and on auscultation, normal breath sounds, no adventitious sounds and normal vocal resonance. Inspection Chest Wall - Normal. Back - normal.  Breast Breast - Left-Symmetric, Non Tender, No Biopsy scars, no Dimpling, No Inflammation, No Lumpectomy scars, No Mastectomy scars, No Peau d' Orange. Breast - Right-Symmetric, Non Tender, No Biopsy scars, no Dimpling, No Inflammation, No Lumpectomy scars, No Mastectomy scars, No Peau d' Orange. Breast Lump-No Palpable Breast Mass.  Cardiovascular Cardiovascular examination reveals -normal heart sounds, regular rate and rhythm with no murmurs and normal pedal pulses bilaterally.  Abdomen - Did not examine.  Neurologic Neurologic evaluation reveals -alert and oriented x 3 with no impairment of recent or remote memory. Mental Status-Normal.  Musculoskeletal Normal Exam - Left-Upper Extremity Strength Normal and Lower Extremity Strength Normal. Normal Exam - Right-Upper Extremity Strength Normal and Lower Extremity Strength Normal.  Lymphatic Head & Neck  General Head & Neck Lymphatics: Bilateral -  Description - Normal. Axillary  General Axillary Region: Bilateral - Description - Normal. Tenderness - Non Tender. Femoral & Inguinal - Did not examine.    Assessment & Plan  INTRADUCTAL PAPILLOMA OF BREAST, RIGHT (D24.1)  Impression: I discussed the diagnosis with her in detail. A radioactive seed localized right breast lumpectomy is recommended for removal of the papilloma for histologic evaluation. I discussed the reasoning for this with her in detail. I discussed the surgical procedure in detail as well. I discussed the risk of surgery which includes but is not limited to bleeding, infection, injury just surrounding structures, need for further surgery malignancy is found, postoperative recovery, etc. She understands and wishes to proceed with surgery Current Plans Pt Education - [facility] Patient Info - Consider Bariatric surgery

## 2015-05-06 ENCOUNTER — Ambulatory Visit (HOSPITAL_BASED_OUTPATIENT_CLINIC_OR_DEPARTMENT_OTHER): Payer: BC Managed Care – PPO | Admitting: Anesthesiology

## 2015-05-06 ENCOUNTER — Encounter (HOSPITAL_BASED_OUTPATIENT_CLINIC_OR_DEPARTMENT_OTHER)
Admission: RE | Disposition: A | Discharge status: Home / Self Care | Payer: Self-pay | Source: Ambulatory Visit | Attending: Surgery

## 2015-05-06 ENCOUNTER — Encounter (HOSPITAL_BASED_OUTPATIENT_CLINIC_OR_DEPARTMENT_OTHER): Payer: Self-pay | Admitting: *Deleted

## 2015-05-06 ENCOUNTER — Ambulatory Visit (HOSPITAL_BASED_OUTPATIENT_CLINIC_OR_DEPARTMENT_OTHER)
Admission: RE | Admit: 2015-05-06 | Discharge: 2015-05-06 | Disposition: A | Discharge status: Home / Self Care | Payer: BC Managed Care – PPO | Source: Ambulatory Visit | Attending: Surgery | Admitting: Surgery

## 2015-05-06 DIAGNOSIS — Z91048 Other nonmedicinal substance allergy status: Secondary | ICD-10-CM | POA: Diagnosis not present

## 2015-05-06 DIAGNOSIS — N6091 Unspecified benign mammary dysplasia of right breast: Secondary | ICD-10-CM | POA: Diagnosis not present

## 2015-05-06 DIAGNOSIS — N6011 Diffuse cystic mastopathy of right breast: Secondary | ICD-10-CM | POA: Insufficient documentation

## 2015-05-06 DIAGNOSIS — D241 Benign neoplasm of right breast: Secondary | ICD-10-CM | POA: Insufficient documentation

## 2015-05-06 DIAGNOSIS — F329 Major depressive disorder, single episode, unspecified: Secondary | ICD-10-CM | POA: Diagnosis not present

## 2015-05-06 DIAGNOSIS — N641 Fat necrosis of breast: Secondary | ICD-10-CM | POA: Insufficient documentation

## 2015-05-06 DIAGNOSIS — Z880 Allergy status to penicillin: Secondary | ICD-10-CM | POA: Insufficient documentation

## 2015-05-06 DIAGNOSIS — F419 Anxiety disorder, unspecified: Secondary | ICD-10-CM | POA: Diagnosis not present

## 2015-05-06 HISTORY — DX: Benign neoplasm of unspecified breast: D24.9

## 2015-05-06 HISTORY — PX: BREAST LUMPECTOMY WITH RADIOACTIVE SEED LOCALIZATION: SHX6424

## 2015-05-06 LAB — POCT HEMOGLOBIN-HEMACUE: Hemoglobin: 10.5 g/dL — ABNORMAL LOW (ref 12.0–15.0)

## 2015-05-06 SURGERY — BREAST LUMPECTOMY WITH RADIOACTIVE SEED LOCALIZATION
Anesthesia: General | Site: Breast | Laterality: Right

## 2015-05-06 MED ORDER — OXYCODONE HCL 5 MG PO TABS: 5.0000 mg | ORAL_TABLET | ORAL | Status: DC | PRN

## 2015-05-06 MED ORDER — SODIUM BICARBONATE 4 % IV SOLN
INTRAVENOUS | Status: AC
  Filled 2015-05-06: qty 5

## 2015-05-06 MED ORDER — ONDANSETRON HCL 4 MG/2ML IJ SOLN
INTRAMUSCULAR | Status: DC | PRN
  Administered 2015-05-06: 4 mg via INTRAVENOUS

## 2015-05-06 MED ORDER — SODIUM CHLORIDE 0.9 % IJ SOLN: 3.0000 mL | INTRAMUSCULAR | Status: DC | PRN

## 2015-05-06 MED ORDER — OXYCODONE HCL 5 MG PO TABS: 5.0000 mg | ORAL_TABLET | Freq: Once | ORAL | Status: DC | PRN

## 2015-05-06 MED ORDER — PHENYLEPHRINE HCL 10 MG/ML IJ SOLN
INTRAMUSCULAR | Status: DC | PRN
  Administered 2015-05-06 (×2): 80 ug via INTRAVENOUS

## 2015-05-06 MED ORDER — MIDAZOLAM HCL 2 MG/2ML IJ SOLN
1.0000 mg | INTRAMUSCULAR | Status: DC | PRN
  Administered 2015-05-06: 2 mg via INTRAVENOUS

## 2015-05-06 MED ORDER — DEXAMETHASONE SODIUM PHOSPHATE 4 MG/ML IJ SOLN
INTRAMUSCULAR | Status: DC | PRN
  Administered 2015-05-06: 10 mg via INTRAVENOUS

## 2015-05-06 MED ORDER — LIDOCAINE HCL (CARDIAC) 20 MG/ML IV SOLN
INTRAVENOUS | Status: DC | PRN
  Administered 2015-05-06: 60 mg via INTRAVENOUS

## 2015-05-06 MED ORDER — FENTANYL CITRATE (PF) 100 MCG/2ML IJ SOLN
50.0000 ug | INTRAMUSCULAR | Status: DC | PRN
  Administered 2015-05-06: 100 ug via INTRAVENOUS

## 2015-05-06 MED ORDER — ACETAMINOPHEN 650 MG RE SUPP: 650.0000 mg | RECTAL | Status: DC | PRN

## 2015-05-06 MED ORDER — BUPIVACAINE-EPINEPHRINE (PF) 0.5% -1:200000 IJ SOLN
INTRAMUSCULAR | Status: AC
  Filled 2015-05-06: qty 30

## 2015-05-06 MED ORDER — ACETAMINOPHEN 325 MG PO TABS: 650.0000 mg | ORAL_TABLET | ORAL | Status: DC | PRN

## 2015-05-06 MED ORDER — HYDROMORPHONE HCL 1 MG/ML IJ SOLN: 0.2500 mg | INTRAMUSCULAR | Status: DC | PRN

## 2015-05-06 MED ORDER — ONDANSETRON HCL 4 MG/2ML IJ SOLN
INTRAMUSCULAR | Status: AC
  Filled 2015-05-06: qty 2

## 2015-05-06 MED ORDER — MORPHINE SULFATE (PF) 2 MG/ML IV SOLN: 1.0000 mg | INTRAVENOUS | Status: DC | PRN

## 2015-05-06 MED ORDER — DEXAMETHASONE SODIUM PHOSPHATE 10 MG/ML IJ SOLN
INTRAMUSCULAR | Status: AC
  Filled 2015-05-06: qty 1

## 2015-05-06 MED ORDER — LIDOCAINE HCL (PF) 1 % IJ SOLN
INTRAMUSCULAR | Status: AC
  Filled 2015-05-06: qty 30

## 2015-05-06 MED ORDER — CEFAZOLIN SODIUM-DEXTROSE 2-3 GM-% IV SOLR
2.0000 g | INTRAVENOUS | Status: AC
  Administered 2015-05-06: 2 g via INTRAVENOUS

## 2015-05-06 MED ORDER — TRAMADOL HCL 50 MG PO TABS: 50.0000 mg | ORAL_TABLET | Freq: Four times a day (QID) | ORAL | Status: DC | PRN

## 2015-05-06 MED ORDER — PROPOFOL 10 MG/ML IV BOLUS
INTRAVENOUS | Status: DC | PRN
  Administered 2015-05-06: 150 mg via INTRAVENOUS

## 2015-05-06 MED ORDER — PROMETHAZINE HCL 25 MG/ML IJ SOLN: 6.2500 mg | INTRAMUSCULAR | Status: DC | PRN

## 2015-05-06 MED ORDER — MIDAZOLAM HCL 2 MG/2ML IJ SOLN
INTRAMUSCULAR | Status: AC
  Filled 2015-05-06: qty 4

## 2015-05-06 MED ORDER — EPHEDRINE SULFATE 50 MG/ML IJ SOLN
INTRAMUSCULAR | Status: DC | PRN
  Administered 2015-05-06 (×2): 10 mg via INTRAVENOUS

## 2015-05-06 MED ORDER — BUPIVACAINE-EPINEPHRINE 0.5% -1:200000 IJ SOLN
INTRAMUSCULAR | Status: DC | PRN
  Administered 2015-05-06: 7 mL
  Administered 2015-05-06: 10 mL

## 2015-05-06 MED ORDER — PROPOFOL 500 MG/50ML IV EMUL
INTRAVENOUS | Status: AC
  Filled 2015-05-06: qty 50

## 2015-05-06 MED ORDER — LIDOCAINE HCL (CARDIAC) 20 MG/ML IV SOLN
INTRAVENOUS | Status: AC
  Filled 2015-05-06: qty 5

## 2015-05-06 MED ORDER — SCOPOLAMINE 1 MG/3DAYS TD PT72: 1.0000 | MEDICATED_PATCH | Freq: Once | TRANSDERMAL | Status: DC | PRN

## 2015-05-06 MED ORDER — LACTATED RINGERS IV SOLN
INTRAVENOUS | Status: DC
  Administered 2015-05-06 (×2): via INTRAVENOUS

## 2015-05-06 MED ORDER — CEFAZOLIN SODIUM-DEXTROSE 2-3 GM-% IV SOLR
INTRAVENOUS | Status: AC
  Filled 2015-05-06: qty 50

## 2015-05-06 MED ORDER — SODIUM CHLORIDE 0.9 % IV SOLN: 250.0000 mL | INTRAVENOUS | Status: DC | PRN

## 2015-05-06 MED ORDER — SODIUM CHLORIDE 0.9 % IJ SOLN: 3.0000 mL | Freq: Two times a day (BID) | INTRAMUSCULAR | Status: DC

## 2015-05-06 MED ORDER — OXYCODONE HCL 5 MG/5ML PO SOLN: 5.0000 mg | Freq: Once | ORAL | Status: DC | PRN

## 2015-05-06 MED ORDER — FENTANYL CITRATE (PF) 100 MCG/2ML IJ SOLN
INTRAMUSCULAR | Status: AC
  Filled 2015-05-06: qty 4

## 2015-05-06 MED ORDER — GLYCOPYRROLATE 0.2 MG/ML IJ SOLN: 0.2000 mg | Freq: Once | INTRAMUSCULAR | Status: DC | PRN

## 2015-05-06 SURGICAL SUPPLY — 46 items
APPLIER CLIP 9.375 MED OPEN (MISCELLANEOUS)
APR CLP MED 9.3 20 MLT OPN (MISCELLANEOUS)
BLADE HEX COATED 2.75 (ELECTRODE) ×2 IMPLANT
BLADE SURG 15 STRL LF DISP TIS (BLADE) ×1 IMPLANT
BLADE SURG 15 STRL SS (BLADE) ×2
CANISTER SUCT 1200ML W/VALVE (MISCELLANEOUS) IMPLANT
CHLORAPREP W/TINT 26ML (MISCELLANEOUS) ×2 IMPLANT
CLIP APPLIE 9.375 MED OPEN (MISCELLANEOUS) IMPLANT
CLIP TI WIDE RED SMALL 6 (CLIP) IMPLANT
COVER BACK TABLE 60X90IN (DRAPES) ×2 IMPLANT
COVER MAYO STAND STRL (DRAPES) ×2 IMPLANT
COVER PROBE W GEL 5X96 (DRAPES) ×2 IMPLANT
DECANTER SPIKE VIAL GLASS SM (MISCELLANEOUS) IMPLANT
DEVICE DUBIN W/COMP PLATE 8390 (MISCELLANEOUS) ×2 IMPLANT
DRAPE LAPAROSCOPIC ABDOMINAL (DRAPES) ×1 IMPLANT
DRAPE LAPAROTOMY 100X72 PEDS (DRAPES) ×1 IMPLANT
DRAPE UTILITY XL STRL (DRAPES) ×2 IMPLANT
ELECT REM PT RETURN 9FT ADLT (ELECTROSURGICAL) ×2
ELECTRODE REM PT RTRN 9FT ADLT (ELECTROSURGICAL) ×1 IMPLANT
GLOVE BIOGEL PI IND STRL 7.0 (GLOVE) IMPLANT
GLOVE BIOGEL PI INDICATOR 7.0 (GLOVE) ×1
GLOVE ECLIPSE 6.5 STRL STRAW (GLOVE) ×1 IMPLANT
GLOVE SURG SIGNA 7.5 PF LTX (GLOVE) ×2 IMPLANT
GOWN STRL REUS W/ TWL LRG LVL3 (GOWN DISPOSABLE) ×1 IMPLANT
GOWN STRL REUS W/ TWL XL LVL3 (GOWN DISPOSABLE) ×1 IMPLANT
GOWN STRL REUS W/TWL LRG LVL3 (GOWN DISPOSABLE) ×2
GOWN STRL REUS W/TWL XL LVL3 (GOWN DISPOSABLE) ×2
KIT MARKER MARGIN INK (KITS) ×2 IMPLANT
LIQUID BAND (GAUZE/BANDAGES/DRESSINGS) ×2 IMPLANT
NDL HYPO 25X1 1.5 SAFETY (NEEDLE) ×1 IMPLANT
NEEDLE HYPO 25X1 1.5 SAFETY (NEEDLE) ×2 IMPLANT
NS IRRIG 1000ML POUR BTL (IV SOLUTION) IMPLANT
PACK BASIN DAY SURGERY FS (CUSTOM PROCEDURE TRAY) ×2 IMPLANT
PENCIL BUTTON HOLSTER BLD 10FT (ELECTRODE) ×2 IMPLANT
SLEEVE SCD COMPRESS KNEE MED (MISCELLANEOUS) ×2 IMPLANT
SPONGE GAUZE 4X4 12PLY STER LF (GAUZE/BANDAGES/DRESSINGS) IMPLANT
SPONGE LAP 4X18 X RAY DECT (DISPOSABLE) ×2 IMPLANT
SUT MNCRL AB 4-0 PS2 18 (SUTURE) ×2 IMPLANT
SUT SILK 2 0 SH (SUTURE) IMPLANT
SUT VIC AB 3-0 SH 27 (SUTURE) ×2
SUT VIC AB 3-0 SH 27X BRD (SUTURE) ×1 IMPLANT
SYR CONTROL 10ML LL (SYRINGE) ×2 IMPLANT
TOWEL OR 17X24 6PK STRL BLUE (TOWEL DISPOSABLE) ×2 IMPLANT
TOWEL OR NON WOVEN STRL DISP B (DISPOSABLE) IMPLANT
TUBE CONNECTING 20X1/4 (TUBING) IMPLANT
YANKAUER SUCT BULB TIP NO VENT (SUCTIONS) IMPLANT

## 2015-05-06 NOTE — Interval H&P Note (Signed)
History and Physical Interval Note:no change in H and P  05/06/2015 7:10 AM  [name]  has presented today for surgery, with the diagnosis of Right Breast Papilloma  The various methods of treatment have been discussed with the patient and family. After consideration of risks, benefits and other options for treatment, the patient has consented to  Procedure(s): RIGHT BREAST LUMPECTOMY WITH RADIOACTIVE SEED LOCALIZATION (Right) as a surgical intervention .  The patient's history has been reviewed, patient examined, no change in status, stable for surgery.  I have reviewed the patient's chart and labs.  Questions were answered to the patient's satisfaction.     [name] A

## 2015-05-06 NOTE — Anesthesia Preprocedure Evaluation (Addendum)
Anesthesia Evaluation  Patient identified by MRN, date of birth, ID band Patient awake    Reviewed: Allergy & Precautions, NPO status , Patient's Chart, lab work & pertinent test results  Airway Mallampati: II  TM Distance: >3 FB Neck ROM: Full    Dental   Pulmonary neg pulmonary ROS,    breath sounds clear to auscultation       Cardiovascular negative cardio ROS   Rhythm:Regular Rate:Normal     Neuro/Psych Anxiety Depression negative neurological ROS     GI/Hepatic negative GI ROS, Neg liver ROS,   Endo/Other  negative endocrine ROS  Renal/GU negative Renal ROS     Musculoskeletal   Abdominal   Peds  Hematology negative hematology ROS (+)   Anesthesia Other Findings   Reproductive/Obstetrics                            Anesthesia Physical Anesthesia Plan  ASA: II  Anesthesia Plan: General   Post-op Pain Management:    Induction: Intravenous  Airway Management Planned: LMA  Additional Equipment:   Intra-op Plan:   Post-operative Plan:   Informed Consent: I have reviewed the patients History and Physical, chart, labs and discussed the procedure including the risks, benefits and alternatives for the proposed anesthesia with the patient or authorized representative who has indicated his/her understanding and acceptance.   Dental advisory given  Plan Discussed with: CRNA  Anesthesia Plan Comments:         Anesthesia Quick Evaluation

## 2015-05-06 NOTE — Anesthesia Postprocedure Evaluation (Signed)
  Anesthesia Post-op Note  Patient: [name]  Procedure(s) Performed: Procedure(s): RIGHT BREAST LUMPECTOMY WITH RADIOACTIVE SEED LOCALIZATION (Right)  Patient Location: PACU  Anesthesia Type:General  Level of Consciousness: awake and alert   Airway and Oxygen Therapy: Patient Spontanous Breathing  Post-op Pain: none  Post-op Assessment: Post-op Vital signs reviewed              Post-op Vital Signs: Reviewed  Last Vitals:  Filed Vitals:   05/06/15 0856  BP: 155/83  Pulse: 99  Temp: 36.7 C  Resp: 16    Complications: No apparent anesthesia complications

## 2015-05-06 NOTE — Anesthesia Preprocedure Evaluation (Deleted)
Anesthesia Evaluation  Patient identified by MRN, date of birth, ID band Patient awake    Reviewed: Allergy & Precautions, NPO status , Patient's Chart, lab work & pertinent test results  Airway        Dental   Pulmonary neg pulmonary ROS,           Cardiovascular Exercise Tolerance: Good negative cardio ROS       Neuro/Psych PSYCHIATRIC DISORDERS Anxiety Depression negative neurological ROS     GI/Hepatic negative GI ROS, Neg liver ROS,   Endo/Other  negative endocrine ROS  Renal/GU negative Renal ROS     Musculoskeletal negative musculoskeletal ROS (+)   Abdominal   Peds  Hematology negative hematology ROS (+)   Anesthesia Other Findings Day of surgery medications reviewed with the patient.  Reproductive/Obstetrics                             Anesthesia Physical Anesthesia Plan Anesthesia Quick Evaluation

## 2015-05-06 NOTE — Discharge Instructions (Signed)
[place] Office Phone Number [phone]  BREAST BIOPSY/ PARTIAL MASTECTOMY: POST OP INSTRUCTIONS  Always review your discharge instruction sheet given to you by the facility where your surgery was performed.  IF YOU HAVE DISABILITY OR FAMILY LEAVE FORMS, YOU MUST BRING THEM TO THE OFFICE FOR PROCESSING.  DO NOT GIVE THEM TO YOUR DOCTOR.  1. A prescription for pain medication may be given to you upon discharge.  Take your pain medication as prescribed, if needed.  If narcotic pain medicine is not needed, then you may take acetaminophen (Tylenol) or ibuprofen (Advil) as needed. 2. Take your usually prescribed medications unless otherwise directed 3. If you need a refill on your pain medication, please contact your pharmacy.  They will contact our office to request authorization.  Prescriptions will not be filled after 5pm or on week-ends. 4. You should eat very light the first 24 hours after surgery, such as soup, crackers, pudding, etc.  Resume your normal diet the day after surgery. 5. Most patients will experience some swelling and bruising in the breast.  Ice packs and a good support bra will help.  Swelling and bruising can take several days to resolve.  6. It is common to experience some constipation if taking pain medication after surgery.  Increasing fluid intake and taking a stool softener will usually help or prevent this problem from occurring.  A mild laxative (Milk of Magnesia or Miralax) should be taken according to package directions if there are no bowel movements after 48 hours. 7. Unless discharge instructions indicate otherwise, you may remove your bandages 24-48 hours after surgery, and you may shower at that time.  You may have steri-strips (small skin tapes) in place directly over the incision.  These strips should be left on the skin for 7-10 days.  If your surgeon used skin glue on the incision, you may shower in 24 hours.  The glue will flake off over the  next 2-3 weeks.  Any sutures or staples will be removed at the office during your follow-up visit. 8. ACTIVITIES:  You may resume regular daily activities (gradually increasing) beginning the next day.  Wearing a good support bra or sports bra minimizes pain and swelling.  You may have sexual intercourse when it is comfortable. a. You may drive when you no longer are taking prescription pain medication, you can comfortably wear a seatbelt, and you can safely maneuver your car and apply brakes. b. RETURN TO WORK:  ______________________________________________________________________________________ 9. You should see your doctor in the office for a follow-up appointment approximately two weeks after your surgery.  Your doctors nurse will typically make your follow-up appointment when she calls you with your pathology report.  Expect your pathology report 2-3 business days after your surgery.  You may call to check if you do not hear from [place] after three days. 10. OTHER INSTRUCTIONS: __YOU MAY TAKE IBUPROFEN AND/OR TYLENOL SEPARATELY OR AT THE SAME TIME FOR PAIN _____________________________________________________________________________________________ _____________________________________________________________________________________________________________________________________ _____________________________________________________________________________________________________________________________________ _____________________________________________________________________________________________________________________________________  WHEN TO CALL YOUR DOCTOR: 1. Fever over 101.0 2. Nausea and/or vomiting. 3. Extreme swelling or bruising. 4. Continued bleeding from incision. 5. Increased pain, redness, or drainage from the incision.  The clinic staff is available to answer your questions during regular business hours.  Please dont hesitate to call and ask to speak to one of the nurses  for clinical concerns.  If you have a medical emergency, go to the nearest emergency room or call 911.  A surgeon from [facility] is  always on call at the hospital.  For further questions, please visit [email or web address]    Post Anesthesia Home Care Instructions  Activity: Get plenty of rest for the remainder of the day. A responsible adult should stay with you for 24 hours following the procedure.  For the next 24 hours, DO NOT: -Drive a car -Paediatric nurse -Drink alcoholic beverages -Take any medication unless instructed by your physician -Make any legal decisions or sign important papers.  Meals: Start with liquid foods such as gelatin or soup. Progress to regular foods as tolerated. Avoid greasy, spicy, heavy foods. If nausea and/or vomiting occur, drink only clear liquids until the nausea and/or vomiting subsides. Call your physician if vomiting continues.  Special Instructions/Symptoms: Your throat may feel dry or sore from the anesthesia or the breathing tube placed in your throat during surgery. If this causes discomfort, gargle with warm salt water. The discomfort should disappear within 24 hours.  If you had a scopolamine patch placed behind your ear for the management of post- operative nausea and/or vomiting:  1. The medication in the patch is effective for 72 hours, after which it should be removed.  Wrap patch in a tissue and discard in the trash. Wash hands thoroughly with soap and water. 2. You may remove the patch earlier than 72 hours if you experience unpleasant side effects which may include dry mouth, dizziness or visual disturbances. 3. Avoid touching the patch. Wash your hands with soap and water after contact with the patch.

## 2015-05-06 NOTE — Op Note (Signed)
RIGHT BREAST LUMPECTOMY WITH RADIOACTIVE SEED LOCALIZATION  Procedure Note  [name] 05/06/2015   Pre-op Diagnosis: Right Breast Intraductal Papilloma     Post-op Diagnosis: same  Procedure(s): RIGHT BREAST LUMPECTOMY WITH RADIOACTIVE SEED LOCALIZATION  Surgeon(s): Coralie Keens, MD  Anesthesia: General  Staff:  Circulator: Lisbeth Ply, RN Scrub Person: Rhea Pink Neiers, CST  Estimated Blood Loss: Minimal               Specimens: sent to path          [name]   Date: 05/06/2015  Time: 7:51 AM

## 2015-05-06 NOTE — Anesthesia Procedure Notes (Addendum)
Date/Time: 05/06/2015 7:24 AM Performed by: Golden Hurter C   Procedure Name: LMA Insertion Date/Time: 05/06/2015 7:24 AM Performed by: Maryella Shivers Pre-anesthesia Checklist: Patient identified, Emergency Drugs available, Suction available and Patient being monitored Patient Re-evaluated:Patient Re-evaluated prior to inductionOxygen Delivery Method: Circle System Utilized Preoxygenation: Pre-oxygenation with 100% oxygen Intubation Type: IV induction Ventilation: Mask ventilation without difficulty LMA: LMA inserted LMA Size: 4.0 Number of attempts: 1 Airway Equipment and Method: Bite block Placement Confirmation: positive ETCO2 Tube secured with: Tape Dental Injury: Teeth and Oropharynx as per pre-operative assessment

## 2015-05-06 NOTE — Transfer of Care (Signed)
Immediate Anesthesia Transfer of Care Note  Patient: [name]  Procedure(s) Performed: Procedure(s): RIGHT BREAST LUMPECTOMY WITH RADIOACTIVE SEED LOCALIZATION (Right)  Patient Location: PACU  Anesthesia Type:General  Level of Consciousness: sedated  Airway & Oxygen Therapy: Patient Spontanous Breathing and Patient connected to face mask oxygen  Post-op Assessment: Report given to RN and Post -op Vital signs reviewed and stable  Post vital signs: Reviewed and stable  Last Vitals:  Filed Vitals:   05/06/15 0758  BP:   Pulse: 92  Temp:   Resp: 16    Complications: No apparent anesthesia complications

## 2015-05-07 ENCOUNTER — Encounter (HOSPITAL_BASED_OUTPATIENT_CLINIC_OR_DEPARTMENT_OTHER): Payer: Self-pay | Admitting: Surgery

## 2015-05-07 NOTE — Op Note (Signed)
NAMEMarland Kitchen  MELANNY, WIRE NO.:  [id number]  MEDICAL RECORD NO.:  [id number]  LOCATION:                               FACILITY:  [facility]  PHYSICIAN:  Coralie Keens, M.D. DATE OF BIRTH:  01/24/1951  DATE OF PROCEDURE:  05/06/2015 DATE OF DISCHARGE:  05/06/2015                              OPERATIVE REPORT   PREOPERATIVE DIAGNOSIS:  Right breast intraductal papilloma.  POSTOPERATIVE DIAGNOSIS:  Right breast intraductal papilloma.  PROCEDURE:  Radioactive seed localized right breast lumpectomy.  SURGEON:  Coralie Keens, M.D.  ANESTHESIA:  General and 0.5% Marcaine with epinephrine.  ESTIMATED BLOOD LOSS:  Minimal.  INDICATIONS:  This is a 64 year old female who was found to have a papilloma under stereotactic biopsy of the right breast.  Decision has been made to proceed with radioactive seed localized lumpectomy.  FINDINGS:  The papilloma itself was directly underneath the nipple areolar complex and I had almost skied it off the underlying surface of the nipple.  Dilated ducts were identified superficially.  X-ray of the specimen confirmed that the radioactive seed and previous marker were in the specimen.  It was quite small, and I elected not to place marker paint as I was afraid it would be overlapping and if this turns out to be malignancy, I would have to remove the nipple-areolar complex given the location of the lesion.  PROCEDURE IN DETAIL:  The patient was identified in the holding area as the correct patient.  Neoprobe was used to confirm that the radioactive seed was indeed in the right breast.  She was then taken to the operating room.  She was placed in a supine position on the operating table.  General anesthesia was induced.  Her right breast was then prepped and draped in usual sterile fashion.  I again identified the area of increased uptake in the right breast, which was almost inferior and lateral to the nipple.  I made a circumareolar  incision with a scalpel after anesthetizing skin with Marcaine.  I took this right directly to the papilloma with the aid of Neoprobe.  Again, this was directly underneath the nipple and I had to almost shave this specimen off the nipple-areolar complex.  Several dilated ducts full of milky fluid were identified.  Once the specimen was removed, it was difficult to orient because of its size.  I x-rayed the specimen and confirmed that the radioactive seed and the previous marker were in the specimen. At this point, hemostasis appeared to be achieved with cautery.  I anesthetized the wound further with Marcaine.  I then closed the subcutaneous tissue with interrupted 3-0 Vicryl sutures and closed the skin with a running 4 Monocryl.  Skin glue was then applied.  The patient tolerated the procedure well.  All sponge, needle, and instrument counts were correct at the end of procedure.  The patient was then extubated in the operating room and taken in a stable condition to the recovery room.  The specimen was eventually marked by Radiology and sent to Pathology for confirmation.     Coralie Keens, M.D.     DB/MEDQ  D:  05/06/2015  T:  05/06/2015  Job:  [id number]

## 2015-05-09 ENCOUNTER — Encounter: Payer: Self-pay | Admitting: Internal Medicine

## 2015-05-09 ENCOUNTER — Ambulatory Visit (AMBULATORY_SURGERY_CENTER): Payer: BC Managed Care – PPO | Admitting: Internal Medicine

## 2015-05-09 VITALS — BP 159/91 | HR 82 | Temp 97.3°F | Resp 22 | Ht 68.0 in | Wt 184.0 lb

## 2015-05-09 DIAGNOSIS — Z1211 Encounter for screening for malignant neoplasm of colon: Secondary | ICD-10-CM

## 2015-05-09 DIAGNOSIS — K642 Third degree hemorrhoids: Secondary | ICD-10-CM | POA: Diagnosis not present

## 2015-05-09 DIAGNOSIS — D124 Benign neoplasm of descending colon: Secondary | ICD-10-CM

## 2015-05-09 HISTORY — DX: Third degree hemorrhoids: K64.2

## 2015-05-09 MED ORDER — SODIUM CHLORIDE 0.9 % IV SOLN: 500.0000 mL | INTRAVENOUS | Status: DC

## 2015-05-09 NOTE — Patient Instructions (Addendum)
I found and removed 4 polyps - they do not look like cancer. You probably know you have hemorrhoids - we can arrange treatment of those .   I will let you know pathology results and when to have another routine colonoscopy by mail.  I appreciate the opportunity to care for you. [name], MD, FACG YOU HAD AN ENDOSCOPIC PROCEDURE TODAY AT [facility]:   Refer to the procedure report that was given to you for any specific questions about what was found during the examination.  If the procedure report does not answer your questions, please call your gastroenterologist to clarify.  If you requested that your care partner not be given the details of your procedure findings, then the procedure report has been included in a sealed envelope for you to review at your convenience later.  YOU SHOULD EXPECT: Some feelings of bloating in the abdomen. Passage of more gas than usual.  Walking can help get rid of the air that was put into your GI tract during the procedure and reduce the bloating. If you had a lower endoscopy (such as a colonoscopy or flexible sigmoidoscopy) you may notice spotting of blood in your stool or on the toilet paper. If you underwent a bowel prep for your procedure, you may not have a normal bowel movement for a few days.  Please Note:  You might notice some irritation and congestion in your nose or some drainage.  This is from the oxygen used during your procedure.  There is no need for concern and it should clear up in a day or so.  SYMPTOMS TO REPORT IMMEDIATELY:   Following lower endoscopy (colonoscopy or flexible sigmoidoscopy):  Excessive amounts of blood in the stool  Significant tenderness or worsening of abdominal pains  Swelling of the abdomen that is new, acute  Fever of 100F or higher   For urgent or emergent issues, a gastroenterologist can be reached at any hour by calling [phone].   DIET: Your first meal following the  procedure should be a small meal and then it is ok to progress to your normal diet. Heavy or fried foods are harder to digest and may make you feel nauseous or bloated.  Likewise, meals heavy in dairy and vegetables can increase bloating.  Drink plenty of fluids but you should avoid alcoholic beverages for 24 hours.  ACTIVITY:  You should plan to take it easy for the rest of today and you should NOT DRIVE or use heavy machinery until tomorrow (because of the sedation medicines used during the test).    FOLLOW UP: Our staff will call the number listed on your records the next business day following your procedure to check on you and address any questions or concerns that you may have regarding the information given to you following your procedure. If we do not reach you, we will leave a message.  However, if you are feeling well and you are not experiencing any problems, there is no need to return our call.  We will assume that you have returned to your regular daily activities without incident.  If any biopsies were taken you will be contacted by phone or by letter within the next 1-3 weeks.  Please call us at [phone] if you have not heard about the biopsies in 3 weeks.    SIGNATURES/CONFIDENTIALITY: You and/or your care partner have signed paperwork which will be entered into your electronic medical record.  These signatures attest  to the fact that that the information above on your After Visit Summary has been reviewed and is understood.  Full responsibility of the confidentiality of this discharge information lies with you and/or your care-partner. 

## 2015-05-09 NOTE — Progress Notes (Signed)
To recovery, report to recovery, VSS.

## 2015-05-09 NOTE — Progress Notes (Signed)
Called to room to assist during endoscopic procedure.  Patient ID and intended procedure confirmed with present staff. Received instructions for my participation in the procedure from the performing physician.  

## 2015-05-09 NOTE — Op Note (Signed)
[place]  [address]. [place], [id number]   COLONOSCOPY PROCEDURE REPORT  PATIENT: Brandi, [name]  MR#: [id number] BIRTHDATE: 10/04/1950 , 79  yrs. old GENDER: female ENDOSCOPIST: [name], MD, [facility] PROCEDURE DATE:  05/09/2015 PROCEDURE:   Colonoscopy, screening and Colonoscopy with snare polypectomy First Screening Colonoscopy - Avg.  risk and is 50 yrs.  old or older Yes.  Prior Negative Screening - Now for repeat screening. N/A  History of Adenoma - Now for follow-up colonoscopy & has been > or = to 3 yrs.  N/A  Polyps removed today? Yes ASA CLASS:   Class II INDICATIONS:Screening for colonic neoplasia and Colorectal Neoplasm Risk Assessment for this procedure is average risk. MEDICATIONS: Propofol 320 mg IV and Monitored anesthesia care  DESCRIPTION OF PROCEDURE:   After the risks benefits and alternatives of the procedure were thoroughly explained, informed consent was obtained.  The digital rectal exam revealed hemorrhoids, Grade III.   The LB [id number]-[id number]  endoscope was introduced through the anus and advanced to the cecum, which was identified by both the appendix and ileocecal valve. No adverse events experienced.   The quality of the prep was good.  (MiraLax was used)  The instrument was then slowly withdrawn as the colon was fully examined. Estimated blood loss is zero unless otherwise noted in this procedure report.      COLON FINDINGS: Four polypoid shaped sessile polyps ranging from 3 to 24mm in size were found in the descending colon.  Polypectomies were performed with a cold snare.  The resection was complete, the polyp tissue was completely retrieved and sent to histology. Otherwise normal colon. Retroflexed views revealed internal Grade III hemorrhoids. The time to cecum = 2.6 Withdrawal time = 15.3 The scope was withdrawn and the procedure completed. COMPLICATIONS: There were no immediate complications.  ENDOSCOPIC  IMPRESSION: Four sessile polyps ranging from 3 to 63mm in size were found in the descending colon; polypectomies were performed with a cold snare otherwise normal colon - good prep - first screen grade 3 internal hemorrhoids  RECOMMENDATIONS: 1.  Timing of repeat colonoscopy will be determined by pathology findings. 2.  Will discuss hemorrhoid treatment options  eSigned:  [name], MD, [facility] 05/09/2015 8:48 AM   cc: The Patient and Dr. Libby Maw

## 2015-05-12 ENCOUNTER — Telehealth: Payer: Self-pay

## 2015-05-12 NOTE — Telephone Encounter (Signed)
Left message on answering machine. 

## 2015-05-14 ENCOUNTER — Encounter: Payer: Self-pay | Admitting: Internal Medicine

## 2015-05-14 DIAGNOSIS — Z86010 Personal history of colonic polyps: Secondary | ICD-10-CM

## 2015-05-14 DIAGNOSIS — Z860101 Personal history of adenomatous and serrated colon polyps: Secondary | ICD-10-CM | POA: Insufficient documentation

## 2015-05-14 HISTORY — DX: Personal history of colonic polyps: Z86.010

## 2015-05-14 HISTORY — DX: Personal history of adenomatous and serrated colon polyps: Z86.0101

## 2015-05-14 NOTE — Progress Notes (Signed)
Quick Note:  4 adenomas max 8 mm Repeat colonoscopy 2019 ______

## 2015-06-17 ENCOUNTER — Telehealth: Payer: Self-pay | Admitting: Internal Medicine

## 2015-06-18 NOTE — Telephone Encounter (Signed)
Pt aware. Pt scheduled to see Dr. Carlean Purl 08/11/15@3 :15pm.

## 2015-06-18 NOTE — Telephone Encounter (Signed)
Dr. Carlean Purl does the pt need to schedule an OV or just the banding? Please advise.

## 2015-06-18 NOTE — Telephone Encounter (Signed)
She can be scheduled into a banding slot - I will check her and review the procedure and we will get started - she can expect 1-3 appointments over the next 2-3 months to treat 9usually second one in 2-4 weeks) and then possibly a follow-up vs prn f/u

## 2015-06-18 NOTE — Telephone Encounter (Signed)
Left message for pt to call back  °

## 2015-07-14 ENCOUNTER — Other Ambulatory Visit: Payer: Self-pay | Admitting: Internal Medicine

## 2015-07-14 DIAGNOSIS — E2839 Other primary ovarian failure: Secondary | ICD-10-CM

## 2015-08-11 ENCOUNTER — Ambulatory Visit (INDEPENDENT_AMBULATORY_CARE_PROVIDER_SITE_OTHER): Payer: BC Managed Care – PPO | Admitting: Internal Medicine

## 2015-08-11 ENCOUNTER — Encounter: Payer: Self-pay | Admitting: Internal Medicine

## 2015-08-11 VITALS — BP 134/88 | HR 96 | Ht 65.0 in | Wt 181.1 lb

## 2015-08-11 DIAGNOSIS — K648 Other hemorrhoids: Secondary | ICD-10-CM | POA: Diagnosis not present

## 2015-08-11 DIAGNOSIS — K642 Third degree hemorrhoids: Secondary | ICD-10-CM

## 2015-08-11 NOTE — Patient Instructions (Addendum)
  HEMORRHOID BANDING PROCEDURE    FOLLOW-UP CARE   1. The procedure you have had should have been relatively painless since the banding of the area involved does not have nerve endings and there is no pain sensation.  The rubber band cuts off the blood supply to the hemorrhoid and the band may fall off as soon as 48 hours after the banding (the band may occasionally be seen in the toilet bowl following a bowel movement). You may notice a temporary feeling of fullness in the rectum which should respond adequately to plain Tylenol or Motrin.  2. Following the banding, avoid strenuous exercise that evening and resume full activity the next day.  A sitz bath (soaking in a warm tub) or bidet is soothing, and can be useful for cleansing the area after bowel movements.     3. To avoid constipation, take two tablespoons of natural wheat bran, natural oat bran, flax, Benefiber or any over the counter fiber supplement and increase your water intake to 7-8 glasses daily.    4. Unless you have been prescribed anorectal medication, do not put anything inside your rectum for two weeks: No suppositories, enemas, fingers, etc.  5. Occasionally, you may have more bleeding than usual after the banding procedure.  This is often from the untreated hemorrhoids rather than the treated one.  Don't be concerned if there is a tablespoon or so of blood.  If there is more blood than this, lie flat with your bottom higher than your head and apply an ice pack to the area. If the bleeding does not stop within a half an hour or if you feel faint, call our office at [phone] or go to the emergency room.  6. Problems are not common; however, if there is a substantial amount of bleeding, severe pain, chills, fever or difficulty passing urine (very rare) or other problems, you should call us at [phone] or report to the nearest emergency room.  7. Do not stay seated continuously for more than 2-3 hours for a day or  two after the procedure.  Tighten your buttock muscles 10-15 times every two hours and take 10-15 deep breaths every 1-2 hours.  Do not spend more than a few minutes on the toilet if you cannot empty your bowel; instead re-visit the toilet at a later time.    Please take 1 tablespoon daily of the benefiber, handout provided.    We will see you at your next visit: 08/27/15 at 11:15AM     I appreciate the opportunity to care for you. Silvano Rusk, MD, [facility]

## 2015-08-11 NOTE — Assessment & Plan Note (Signed)
RP banded 

## 2015-08-11 NOTE — Progress Notes (Signed)
   Foye Spurling PA-S present  PROCEDURE NOTE: The patient presents with symptomatic grade 3 hemorrhoids, requesting rubber band ligation of his/her hemorrhoidal disease.  All risks, benefits and alternative forms of therapy were described and informed consent was obtained.  In the Left Lateral Decubitus position anoscopic examination revealed grade  hemorrhoids in the RP position and Grade 2 in the RA and LL positions. Fibroepithelial polypoid changes in RP hemorrhoid tissue that required manual reduction.  The anorectum was pre-medicated with 0.125% NTG and 5% lidocaine The decision was made to band the RP internal hemorrhoid, and the [place] was used to perform band ligation without complication.  Digital anorectal examination was then performed to assure proper positioning of the band, and to adjust the banded tissue as required. The hemorrhoid was no longer prolapsed - band was above the polypoid changes.  The patient was discharged home without pain or other issues.  Dietary and behavioral recommendations were given and along with follow-up instructions.     The following adjunctive treatments were recommended:  1 tbsp Benefiber qd  The patient will return in 2-3 weeks for  follow-up and possible additional banding as required. No complications were encountered and the patient tolerated the procedure well.

## 2015-08-12 ENCOUNTER — Encounter: Payer: Self-pay | Admitting: Internal Medicine

## 2015-08-27 ENCOUNTER — Encounter: Payer: Self-pay | Admitting: Internal Medicine

## 2015-08-27 ENCOUNTER — Ambulatory Visit (INDEPENDENT_AMBULATORY_CARE_PROVIDER_SITE_OTHER): Payer: BC Managed Care – PPO | Admitting: Internal Medicine

## 2015-08-27 VITALS — BP 130/84 | HR 88 | Ht 65.0 in | Wt 181.8 lb

## 2015-08-27 DIAGNOSIS — K648 Other hemorrhoids: Secondary | ICD-10-CM | POA: Diagnosis not present

## 2015-08-27 DIAGNOSIS — K642 Third degree hemorrhoids: Secondary | ICD-10-CM

## 2015-08-27 NOTE — Assessment & Plan Note (Signed)
Recurrent RP internal hemorrhoid Refer to Dr. Marcello Moores Did not respond to one banding - which I thought may happen She is willing to see surgeon

## 2015-08-27 NOTE — Patient Instructions (Signed)
  You have been scheduled for an appointment with Dr Leighton Ruff at [facility]. Your appointment is on 09/05/15 at 9:20AM. Please arrive at 9:00AM for registration. Make certain to bring a list of current medications, including any over the counter medications or vitamins. Also bring your co-pay if you have one as well as your insurance cards. [facility] is located at [address]. Should you need to reschedule your appointment, please contact them at [phone].    I appreciate the opportunity to care for you. Silvano Rusk, MD, [facility]

## 2015-08-27 NOTE — Progress Notes (Signed)
   Subjective:    Patient ID: [name], female    DOB: 11/24/1950, 65 y.o.   MRN: [id number] F/U hemorrhoids HPI S/p banding of a grade II RP internal hemorrhoid about 2 weeks ago - was ok until 2 d ago when it prolapsed again. Bleeding and irritation again. Says it recurred after she started intercourse again.  She wanted to try banding over surgery when we first met.  Medications, allergies, past medical history, past surgical history, family history and social history are reviewed and updated in the EMR.  Review of Systems As above    Objective:   Physical Exam BP 130/84 mmHg  Pulse 88  Ht '5\' 5"'$  (1.651 m)  Wt 181 lb 12.8 oz (82.464 kg)  BMI 30.25 kg/m2 Rectal exam, June McMurray, CMA present -  Grade 3 prolapsed RP internal hemorrhoid with fibroepithelial polypoid changes present      Assessment & Plan:  Prolapsed internal hemorrhoids, grade 3 Recurrent RP internal hemorrhoid Refer to Dr. Marcello Moores Did not respond to one banding - which I thought may happen She is willing to see surgeon  cc: Dr. Leighton Ruff

## 2015-09-05 ENCOUNTER — Other Ambulatory Visit: Payer: Self-pay | Admitting: Surgery

## 2018-06-23 ENCOUNTER — Telehealth: Payer: Self-pay

## 2018-06-23 NOTE — Telephone Encounter (Signed)
Left her a detailed message to call and get set up for her pre-visit and colon appointments. Hx of polyps. Recall in system for 04/2018.

## 2018-07-05 ENCOUNTER — Encounter: Payer: Self-pay | Admitting: Internal Medicine

## 2018-07-10 NOTE — Telephone Encounter (Signed)
[name] called back and set up her appointments.

## 2018-08-09 ENCOUNTER — Encounter: Payer: BC Managed Care – PPO | Admitting: Internal Medicine

## 2018-12-20 ENCOUNTER — Other Ambulatory Visit: Payer: Self-pay | Admitting: *Deleted

## 2018-12-20 DIAGNOSIS — Z20822 Contact with and (suspected) exposure to covid-19: Secondary | ICD-10-CM

## 2018-12-24 LAB — NOVEL CORONAVIRUS, NAA: SARS-CoV-2, NAA: NOT DETECTED

## 2019-01-19 ENCOUNTER — Other Ambulatory Visit: Payer: Self-pay

## 2019-01-19 ENCOUNTER — Emergency Department (HOSPITAL_COMMUNITY): Payer: Medicare Other

## 2019-01-19 ENCOUNTER — Inpatient Hospital Stay (HOSPITAL_COMMUNITY): Payer: Medicare Other

## 2019-01-19 ENCOUNTER — Encounter (HOSPITAL_COMMUNITY): Payer: Self-pay

## 2019-01-19 ENCOUNTER — Inpatient Hospital Stay (HOSPITAL_COMMUNITY)
Admission: EM | Admit: 2019-01-19 | Discharge: 2019-01-29 | DRG: 336 | Disposition: A | Discharge status: Home / Self Care | Payer: Medicare Other | Attending: Internal Medicine | Admitting: Internal Medicine

## 2019-01-19 DIAGNOSIS — R739 Hyperglycemia, unspecified: Secondary | ICD-10-CM | POA: Diagnosis present

## 2019-01-19 DIAGNOSIS — E876 Hypokalemia: Secondary | ICD-10-CM | POA: Diagnosis present

## 2019-01-19 DIAGNOSIS — Z880 Allergy status to penicillin: Secondary | ICD-10-CM

## 2019-01-19 DIAGNOSIS — E872 Acidosis: Secondary | ICD-10-CM | POA: Diagnosis present

## 2019-01-19 DIAGNOSIS — R9431 Abnormal electrocardiogram [ECG] [EKG]: Secondary | ICD-10-CM | POA: Diagnosis present

## 2019-01-19 DIAGNOSIS — R17 Unspecified jaundice: Secondary | ICD-10-CM | POA: Diagnosis present

## 2019-01-19 DIAGNOSIS — F319 Bipolar disorder, unspecified: Secondary | ICD-10-CM | POA: Diagnosis present

## 2019-01-19 DIAGNOSIS — F419 Anxiety disorder, unspecified: Secondary | ICD-10-CM | POA: Diagnosis present

## 2019-01-19 DIAGNOSIS — Z90710 Acquired absence of both cervix and uterus: Secondary | ICD-10-CM

## 2019-01-19 DIAGNOSIS — N289 Disorder of kidney and ureter, unspecified: Secondary | ICD-10-CM

## 2019-01-19 DIAGNOSIS — E878 Other disorders of electrolyte and fluid balance, not elsewhere classified: Secondary | ICD-10-CM

## 2019-01-19 DIAGNOSIS — Z5331 Laparoscopic surgical procedure converted to open procedure: Secondary | ICD-10-CM

## 2019-01-19 DIAGNOSIS — K56609 Unspecified intestinal obstruction, unspecified as to partial versus complete obstruction: Secondary | ICD-10-CM

## 2019-01-19 DIAGNOSIS — E860 Dehydration: Secondary | ICD-10-CM | POA: Diagnosis present

## 2019-01-19 DIAGNOSIS — Z79899 Other long term (current) drug therapy: Secondary | ICD-10-CM

## 2019-01-19 DIAGNOSIS — K567 Ileus, unspecified: Secondary | ICD-10-CM | POA: Diagnosis not present

## 2019-01-19 DIAGNOSIS — R7303 Prediabetes: Secondary | ICD-10-CM

## 2019-01-19 DIAGNOSIS — Z6833 Body mass index (BMI) 33.0-33.9, adult: Secondary | ICD-10-CM

## 2019-01-19 DIAGNOSIS — R109 Unspecified abdominal pain: Secondary | ICD-10-CM

## 2019-01-19 DIAGNOSIS — R000 Tachycardia, unspecified: Secondary | ICD-10-CM | POA: Diagnosis present

## 2019-01-19 DIAGNOSIS — N736 Female pelvic peritoneal adhesions (postinfective): Secondary | ICD-10-CM | POA: Diagnosis present

## 2019-01-19 DIAGNOSIS — I10 Essential (primary) hypertension: Secondary | ICD-10-CM | POA: Diagnosis present

## 2019-01-19 DIAGNOSIS — Z20828 Contact with and (suspected) exposure to other viral communicable diseases: Secondary | ICD-10-CM | POA: Diagnosis present

## 2019-01-19 DIAGNOSIS — N200 Calculus of kidney: Secondary | ICD-10-CM | POA: Diagnosis present

## 2019-01-19 DIAGNOSIS — K648 Other hemorrhoids: Secondary | ICD-10-CM | POA: Diagnosis present

## 2019-01-19 DIAGNOSIS — K5651 Intestinal adhesions [bands], with partial obstruction: Secondary | ICD-10-CM | POA: Diagnosis present

## 2019-01-19 DIAGNOSIS — E785 Hyperlipidemia, unspecified: Secondary | ICD-10-CM | POA: Diagnosis not present

## 2019-01-19 DIAGNOSIS — D649 Anemia, unspecified: Secondary | ICD-10-CM

## 2019-01-19 DIAGNOSIS — E870 Hyperosmolality and hypernatremia: Secondary | ICD-10-CM | POA: Diagnosis not present

## 2019-01-19 DIAGNOSIS — Z0189 Encounter for other specified special examinations: Secondary | ICD-10-CM

## 2019-01-19 DIAGNOSIS — E669 Obesity, unspecified: Secondary | ICD-10-CM

## 2019-01-19 DIAGNOSIS — Z8249 Family history of ischemic heart disease and other diseases of the circulatory system: Secondary | ICD-10-CM

## 2019-01-19 DIAGNOSIS — H5713 Ocular pain, bilateral: Secondary | ICD-10-CM | POA: Diagnosis not present

## 2019-01-19 HISTORY — DX: Unspecified intestinal obstruction, unspecified as to partial versus complete obstruction: K56.609

## 2019-01-19 HISTORY — DX: Essential (primary) hypertension: I10

## 2019-01-19 LAB — CBC
HCT: 38 % (ref 36.0–46.0)
Hemoglobin: 12.2 g/dL (ref 12.0–15.0)
MCH: 29 pg (ref 26.0–34.0)
MCHC: 32.1 g/dL (ref 30.0–36.0)
MCV: 90.5 fL (ref 80.0–100.0)
Platelets: 458 10*3/uL — ABNORMAL HIGH (ref 150–400)
RBC: 4.2 MIL/uL (ref 3.87–5.11)
RDW: 16.8 % — ABNORMAL HIGH (ref 11.5–15.5)
WBC: 6.1 10*3/uL (ref 4.0–10.5)
nRBC: 0 % (ref 0.0–0.2)

## 2019-01-19 LAB — COMPREHENSIVE METABOLIC PANEL
ALT: 12 U/L (ref 0–44)
AST: 13 U/L — ABNORMAL LOW (ref 15–41)
Albumin: 3.9 g/dL (ref 3.5–5.0)
Alkaline Phosphatase: 87 U/L (ref 38–126)
Anion gap: 9 (ref 5–15)
BUN: 9 mg/dL (ref 8–23)
CO2: 21 mmol/L — ABNORMAL LOW (ref 22–32)
Calcium: 10.3 mg/dL (ref 8.9–10.3)
Chloride: 107 mmol/L (ref 98–111)
Creatinine, Ser: 1.06 mg/dL — ABNORMAL HIGH (ref 0.44–1.00)
GFR calc Af Amer: >60 mL/min (ref >60)
GFR calc non Af Amer: 54 mL/min — ABNORMAL LOW (ref >60)
Glucose, Bld: 125 mg/dL — ABNORMAL HIGH (ref 70–99)
Potassium: 3.7 mmol/L (ref 3.5–5.1)
Sodium: 137 mmol/L (ref 135–145)
Total Bilirubin: 1.7 mg/dL — ABNORMAL HIGH (ref 0.3–1.2)
Total Protein: 6.8 g/dL (ref 6.5–8.1)

## 2019-01-19 LAB — HEMOGLOBIN A1C
Hgb A1c MFr Bld: 6.3 % — ABNORMAL HIGH (ref 4.8–5.6)
Mean Plasma Glucose: 134.11 mg/dL

## 2019-01-19 LAB — SARS CORONAVIRUS 2 BY RT PCR (HOSPITAL ORDER, PERFORMED IN ~~LOC~~ HOSPITAL LAB): SARS Coronavirus 2: NEGATIVE

## 2019-01-19 LAB — LITHIUM LEVEL: Lithium Lvl: 0.33 mmol/L — ABNORMAL LOW (ref 0.60–1.20)

## 2019-01-19 LAB — URINALYSIS, ROUTINE W REFLEX MICROSCOPIC
Bilirubin Urine: NEGATIVE
Glucose, UA: NEGATIVE mg/dL
Hgb urine dipstick: NEGATIVE
Ketones, ur: NEGATIVE mg/dL
Leukocytes,Ua: NEGATIVE
Nitrite: NEGATIVE
Protein, ur: 100 mg/dL — AB
Specific Gravity, Urine: 1.012 (ref 1.005–1.030)
pH: 6 (ref 5.0–8.0)

## 2019-01-19 LAB — LIPASE, BLOOD: Lipase: 31 U/L (ref 11–51)

## 2019-01-19 MED ORDER — ENOXAPARIN SODIUM 40 MG/0.4ML ~~LOC~~ SOLN
40.0000 mg | SUBCUTANEOUS | Status: DC
  Administered 2019-01-19 – 2019-01-21 (×3): 40 mg via SUBCUTANEOUS
  Filled 2019-01-19 (×3): qty 0.4

## 2019-01-19 MED ORDER — SODIUM CHLORIDE 0.9 % IV BOLUS
1000.0000 mL | Freq: Once | INTRAVENOUS | Status: AC
  Administered 2019-01-19: 1000 mL via INTRAVENOUS

## 2019-01-19 MED ORDER — ONDANSETRON HCL 4 MG/2ML IJ SOLN
4.0000 mg | Freq: Four times a day (QID) | INTRAMUSCULAR | Status: DC | PRN
  Administered 2019-01-19: 4 mg via INTRAVENOUS
  Filled 2019-01-19: qty 2

## 2019-01-19 MED ORDER — IOPAMIDOL (ISOVUE-300) INJECTION 61%
100.0000 mL | Freq: Once | INTRAVENOUS | Status: AC | PRN
  Administered 2019-01-19: 100 mL via INTRAVENOUS

## 2019-01-19 MED ORDER — DIATRIZOATE MEGLUMINE & SODIUM 66-10 % PO SOLN
90.0000 mL | Freq: Once | ORAL | Status: AC
  Administered 2019-01-19: 90 mL via NASOGASTRIC
  Filled 2019-01-19: qty 90

## 2019-01-19 MED ORDER — SODIUM CHLORIDE 0.9 % IV SOLN
INTRAVENOUS | Status: DC
  Administered 2019-01-19: 18:00:00 via INTRAVENOUS

## 2019-01-19 MED ORDER — LABETALOL HCL 5 MG/ML IV SOLN: 10.0000 mg | Freq: Four times a day (QID) | INTRAVENOUS | Status: DC | PRN

## 2019-01-19 MED ORDER — DEXTROSE IN LACTATED RINGERS 5 % IV SOLN
INTRAVENOUS | Status: DC
  Administered 2019-01-19 – 2019-01-22 (×5): via INTRAVENOUS

## 2019-01-19 MED ORDER — MORPHINE SULFATE (PF) 2 MG/ML IV SOLN
2.0000 mg | INTRAVENOUS | Status: DC | PRN
  Administered 2019-01-19 – 2019-01-26 (×10): 2 mg via INTRAVENOUS
  Filled 2019-01-19 (×13): qty 1

## 2019-01-19 NOTE — ED Notes (Signed)
Pt back from CT

## 2019-01-19 NOTE — ED Triage Notes (Signed)
Pt from home c/o generalized abd pain since Monday afternoon; cramping; 1 episode of emesis;  Last normal BM Sunday, pt took and dulcolax, miralax, and enema at home, had small BM Wednesday after enema, but none since; endorses loss of appetite, states she continues to have difficulty keeping food down, continued cramping, abdominal distension

## 2019-01-19 NOTE — ED Provider Notes (Signed)
[facility] EMERGENCY DEPARTMENT Provider Note   CSN: [id number] Arrival date & time: 01/19/19  1009     History   Chief Complaint No chief complaint on file. CC: abdominal pain, vomiting   HPI [name] is a 68 y.o. female.     Patient with history of abdominal hysterectomy, recent colonoscopy showing polyps and hemorrhoids, chronic lithium use --presents the emergency department today with complaint of abdominal pain, vomiting, decreased appetite, constipation, abdominal distention.  Symptoms started 4 to 5 days ago.  Patient thought that she was constipated and she tried Dulcolax, MiraLAX, home enema.  These did not really help.  Her abdomen continued to swell.  She has been having difficulty keeping down solid foods and liquids.  She has kept down a little bit of water and ginger ale.  She denies any fever, chest pain, shortness of breath or cough.  No urinary symptoms.  The onset of this condition was acute. The course is worsening. Aggravating factors: none. Alleviating factors: none.       Past Medical History:  Diagnosis Date   Anxiety    Depression    Hx of adenomatous colonic polyps 05/14/2015   Hypertension    Papilloma of breast    right   Prolapsed internal hemorrhoids, grade 3 05/09/2015   Vitamin D deficiency     Patient Active Problem List   Diagnosis Date Noted   Hx of adenomatous colonic polyps 05/14/2015   Prolapsed internal hemorrhoids, grade 3 05/09/2015    Past Surgical History:  Procedure Laterality Date   ABDOMINAL HYSTERECTOMY     ANKLE FRACTURE SURGERY     BREAST LUMPECTOMY WITH RADIOACTIVE SEED LOCALIZATION Right 05/06/2015   Procedure: RIGHT BREAST LUMPECTOMY WITH RADIOACTIVE SEED LOCALIZATION;  Surgeon: Coralie Keens, MD;  Location: [facility];  Service: General;  Laterality: Right;   COLONOSCOPY     ESOPHAGOGASTRODUODENOSCOPY     FINGER FRACTURE SURGERY     HEMORRHOID BANDING       OVARIAN CYST REMOVAL     TONSILLECTOMY AND ADENOIDECTOMY       OB History   No obstetric history on file.      Home Medications    Prior to Admission medications   Medication Sig Start Date End Date Taking? Authorizing Provider  Cholecalciferol (VITAMIN D-3 PO) Take 2,000 Units by mouth.    [provider]  DULoxetine (CYMBALTA) 60 MG capsule Take 60 mg by mouth daily.    [provider]  LITHIUM PO Take 900 mg by mouth.    [provider]    Family History Family History  Problem Relation Age of Onset   Colon cancer Neg Hx     Social History Social History   Tobacco Use   Smoking status: Never Smoker   Smokeless tobacco: Never Used  Substance Use Topics   Alcohol use: No    Alcohol/week: 0.0 standard drinks   Drug use: No     Allergies   Nickel, Penicillins, and Wool alcohol [lanolin]   Review of Systems Review of Systems  Constitutional: Negative for fever.  HENT: Negative for rhinorrhea and sore throat.   Eyes: Negative for redness.  Respiratory: Negative for cough.   Cardiovascular: Negative for chest pain.  Gastrointestinal: Positive for abdominal distention, abdominal pain, constipation, nausea and vomiting. Negative for diarrhea.  Genitourinary: Positive for decreased urine volume. Negative for dysuria.  Musculoskeletal: Negative for myalgias.  Skin: Negative for rash.  Neurological: Negative for headaches.  Physical Exam Updated Vital Signs BP 114/88    Pulse (!) 114    Temp 98.6 F (37 C) (Oral)    Resp 16    Ht 5\' 2"  (1.575 m)    Wt 83.9 kg    SpO2 97%    BMI 33.84 kg/m   Physical Exam Vitals signs and nursing note reviewed.  Constitutional:      Appearance: She is well-developed.  HENT:     Head: Normocephalic and atraumatic.  Eyes:     General:        Right eye: No discharge.        Left eye: No discharge.     Conjunctiva/sclera: Conjunctivae normal.  Neck:     Musculoskeletal: Normal range  of motion and neck supple.  Cardiovascular:     Rate and Rhythm: Regular rhythm. Tachycardia present.     Heart sounds: Normal heart sounds.  Pulmonary:     Effort: Pulmonary effort is normal.     Breath sounds: Normal breath sounds.  Abdominal:     General: There is distension.     Palpations: Abdomen is soft.     Tenderness: There is abdominal tenderness (generalized).     Hernia: No hernia is present.     Comments: Decreased bowel sounds generally  Skin:    General: Skin is warm and dry.  Neurological:     Mental Status: She is alert.      ED Treatments / Results  Labs (all labs ordered are listed, but only abnormal results are displayed) Labs Reviewed  COMPREHENSIVE METABOLIC PANEL - Abnormal; Notable for the following components:      Result Value   CO2 21 (*)    Glucose, Bld 125 (*)    Creatinine, Ser 1.06 (*)    AST 13 (*)    Total Bilirubin 1.7 (*)    GFR calc non Af Amer 54 (*)    All other components within normal limits  CBC - Abnormal; Notable for the following components:   RDW 16.8 (*)    Platelets 458 (*)    All other components within normal limits  URINALYSIS, ROUTINE W REFLEX MICROSCOPIC - Abnormal; Notable for the following components:   Protein, ur 100 (*)    Bacteria, UA RARE (*)    All other components within normal limits  LITHIUM LEVEL - Abnormal; Notable for the following components:   Lithium Lvl 0.33 (*)    All other components within normal limits  SARS CORONAVIRUS 2 (HOSPITAL ORDER, [facility])  LIPASE, BLOOD    EKG EKG Interpretation  Date/Time:  Friday January 19 2019 11:32:27 EDT Ventricular Rate:  111 PR Interval:    QRS Duration: 69 QT Interval:  415 QTC Calculation: 564 R Axis:   1 Text Interpretation:  Sinus tachycardia LAE, consider biatrial enlargement Borderline repolarization abnormality Prolonged QT interval Confirmed by Virgel Manifold [phone]) on 01/19/2019 12:13:09 PM   Radiology Ct Abdomen  Pelvis W Contrast  Result Date: 01/19/2019 CLINICAL DATA:  Abdominal pain with swelling for 3 days. EXAM: CT ABDOMEN AND PELVIS WITH CONTRAST TECHNIQUE: Multidetector CT imaging of the abdomen and pelvis was performed using the standard protocol following bolus administration of intravenous contrast. CONTRAST:  152mL ISOVUE-300 IOPAMIDOL (ISOVUE-300) INJECTION 61% COMPARISON:  CT, 06/20/2006 FINDINGS: Lower chest: No acute abnormality. Hepatobiliary: 2 cm partial fat density lesion at the dome the left liver with associated calcification. Additional low-density lesions seen at the dome of the right lobe,  next largest 1.4 cm. No other liver masses or lesions. Normal gallbladder. No bile duct dilation. Pancreas: Unremarkable. No pancreatic ductal dilatation or surrounding inflammatory changes. Spleen: Normal in size without focal abnormality. Adrenals/Urinary Tract: No adrenal masses. Kidneys normal in size, orientation and position with symmetric enhancement and excretion. Small low-density renal lesions are noted too small to fully characterize, but likely cysts. 1 mm nonobstructing stone in the lower pole of the left kidney. No hydronephrosis. Ureters normal in course and in caliber. Bladder is unremarkable. Stomach/Bowel: Stomach is unremarkable. There is dilated small bowel, maximum dilation 4.2 cm, with 2 possible transition points in the pelvis, followed by distal decompressed small bowel. The colon is mostly decompressed. There is no bowel wall thickening. No inflammation Vascular/Lymphatic: Aortic atherosclerosis. No aneurysm. No enlarged lymph nodes. Reproductive: Status post hysterectomy. No adnexal masses. Other: Trace ascites noted adjacent to the liver. Musculoskeletal: No fracture or acute finding. No osteoblastic or osteolytic lesions. IMPRESSION: 1. Partial small bowel obstruction, apparent transition point in the pelvis, likely from adhesions. 2. No other acute abnormality within the abdomen or  pelvis. 3. Low-density lesions at the dome of the liver which are new since the prior CT, but appear benign. 4. Small low-density renal lesions, too small to fully characterize but likely cysts. Tiny nonobstructing left intrarenal stone. 5. Aortic atherosclerosis. Electronically Signed   By: Lajean Manes M.D.   On: 01/19/2019 13:54    Procedures Procedures (including critical care time)  Medications Ordered in ED Medications  0.9 %  sodium chloride infusion (has no administration in time range)  sodium chloride 0.9 % bolus 1,000 mL (0 mLs Intravenous Stopped 01/19/19 1441)  iopamidol (ISOVUE-300) 61 % injection 100 mL (100 mLs Intravenous Contrast Given 01/19/19 1342)     Initial Impression / Assessment and Plan / ED Course  I have reviewed the triage vital signs and the nursing notes.  Pertinent labs & imaging results that were available during my care of the patient were reviewed by me and considered in my medical decision making (see chart for details).        Patient seen and examined. Work-up initiated.  Fluids ordered.  Exam concerning for small bowel obstruction.  Will obtain CT imaging to further delineate.  Vital signs reviewed and are as follows: BP 114/88    Pulse (!) 114    Temp 98.6 F (37 C) (Oral)    Resp 16    Ht 5\' 2"  (1.575 m)    Wt 83.9 kg    SpO2 97%    BMI 33.84 kg/m   1:36 PM CT images personally reviewed and interpreted. Suggestive of SBO. Transition point appears lower abd/pelvis just right of midline.   2:05 PM Reviewed radiology report. Pt updated. Will discuss with surgery and admit.   2:41 PM Spoke with PA Janett Billow of general surgery.   3:26 PM Khatri PA-C aware of patient at shift change. If surgery admits, no further actions. If med admit requested, admit to unassigned med for SBO.   Final Clinical Impressions(s) / ED Diagnoses   Final diagnoses:  Small bowel obstruction ([place])   Admit.   ED Discharge Orders    None       Carlisle Cater,  Hershal Coria 01/19/19 1527    Virgel Manifold, MD 01/20/19 [phone]

## 2019-01-19 NOTE — H&P (Addendum)
History and Physical  [name] DOB: 11/25/1950 DOA: 01/19/2019  Referring physician: Dr. Rondel Oh PCP: Gregor Hams, FNP  Outpatient Specialists: None Patient coming from: Home  Chief Complaint: Abdominal pain, nausea and vomiting  HPI: [name] is a 68 y.o. female with medical history significant for gynecological abdominal surgeries x2, essential hypertension, chronic depression/anxiety/bipolar disorder, who presented to [facility] ED with complaints of severe abdominal pain associated with nausea and vomiting x4 days.  States symptoms began after eating lunch on Tuesday 4 days ago.  She experienced significant abdominal pain with vomiting x 1.  Felt constipated at first with no flatus.  Symptoms would not resolve despite taking MiraLAX and giving herself an enema. Symptoms persisted the following day and then again on Thursday. She called her primary care provider and was advised to contact the office this morning for possible walk in. Due to no openings, PCP recommended ED evaluation.  Patient states she has never experienced this in the past.  Has a history of 2 prior gynecological abdominal surgeries first oophorectomy at the age of 75.  Her last surgery was 15 years ago, a total hysterectomy.  She was in her normal state of health prior to this.  ED Course: In the ED, vital signs remarkable for tachycardia.  Lab studies remarkable for elevated total bilirubin.  CT abdomen and pelvis with contrast revealed partial small bowel obstruction.  General surgery contacted by EDP who recommended admission by  [facility].  Review of Systems: Review of systems as noted in the HPI. All other systems reviewed and are negative.   Past Medical History:  Diagnosis Date   Anxiety    Depression    Hx of adenomatous colonic polyps 05/14/2015   Hypertension    Papilloma of breast    right   Prolapsed internal hemorrhoids, grade 3 05/09/2015   Vitamin D deficiency    Past  Surgical History:  Procedure Laterality Date   ABDOMINAL HYSTERECTOMY     ANKLE FRACTURE SURGERY     BREAST LUMPECTOMY WITH RADIOACTIVE SEED LOCALIZATION Right 05/06/2015   Procedure: RIGHT BREAST LUMPECTOMY WITH RADIOACTIVE SEED LOCALIZATION;  Surgeon: Coralie Keens, MD;  Location: [place];  Service: General;  Laterality: Right;   COLONOSCOPY     ESOPHAGOGASTRODUODENOSCOPY     FINGER FRACTURE SURGERY     HEMORRHOID BANDING     OVARIAN CYST REMOVAL     TONSILLECTOMY AND ADENOIDECTOMY      Social History:  reports that she has never smoked. She has never used smokeless tobacco. She reports that she does not drink alcohol or use drugs.   Allergies  Allergen Reactions   Nickel Rash   Penicillins Rash    Did it involve swelling of the face/tongue/throat, SOB, or low BP? No Did it involve sudden or severe rash/hives, skin peeling, or any reaction on the inside of your mouth or nose? No Did you need to seek medical attention at a hospital or doctor's office? No When did it last happen? If all above answers are NO, may proceed with cephalosporin use.   Wool Alcohol [Lanolin] Rash    Family History  Problem Relation Age of Onset   Colon cancer Neg Hx     Father with history of heart disease and kidney problems.   Paternal grandfather history of heart disease.  Prior to Admission medications   Medication Sig Start Date End Date Taking? Authorizing Provider  amLODipine (NORVASC) 10 MG tablet Take 10 mg by mouth  daily. 11/13/18  Yes [provider]  atorvastatin (LIPITOR) 10 MG tablet Take 10 mg by mouth daily. 12/22/18  Yes [provider]  azelastine (ASTELIN) 0.1 % nasal spray Place 1 spray into both nostrils as needed for allergies. 08/26/18  Yes [provider]  Cholecalciferol (VITAMIN D-3 PO) Take 2,000 Units by mouth.   Yes [provider]  DULoxetine (CYMBALTA) 60 MG capsule Take 60 mg by mouth daily.    Yes [provider]  lithium carbonate 300 MG capsule Take 600 mg by mouth daily. 11/17/18  Yes [provider]    Physical Exam: BP 127/87 (BP Location: Right Arm)    Pulse (!) 113    Temp 98.8 F (37.1 C) (Oral)    Resp 16    Ht 5\' 2"  (1.575 m)    Wt 83.9 kg    SpO2 99%    BMI 33.84 kg/m    General: 68 y.o. year-old female well developed well nourished in no acute distress.  Alert and oriented x3.  NG tube in place to suction.  Cardiovascular: Regular rate and rhythm with no rubs or gallops.  No thyromegaly or JVD noted.  No lower extremity edema. 2/4 pulses in all 4 extremities.  Respiratory: Clear to auscultation with no wheezes or rales. Good inspiratory effort.  Abdomen: Soft nontender nondistended with normal bowel sounds x4 quadrants.  Muskuloskeletal: No cyanosis, clubbing or edema noted bilaterally  Neuro: CN II-XII intact, strength, sensation, reflexes  Skin: No ulcerative lesions noted or rashes  Psychiatry: Judgement and insight appear normal. Mood is appropriate for condition and setting          Labs on Admission:  Basic Metabolic Panel: Recent Labs  Lab 01/19/19 1046  NA 137  K 3.7  CL 107  CO2 21*  GLUCOSE 125*  BUN 9  CREATININE 1.06*  CALCIUM 10.3   Liver Function Tests: Recent Labs  Lab 01/19/19 1046  AST 13*  ALT 12  ALKPHOS 87  BILITOT 1.7*  PROT 6.8  ALBUMIN 3.9   Recent Labs  Lab 01/19/19 1046  LIPASE 31   No results for input(s): AMMONIA in the last 168 hours. CBC: Recent Labs  Lab 01/19/19 1046  WBC 6.1  HGB 12.2  HCT 38.0  MCV 90.5  PLT 458*   Cardiac Enzymes: No results for input(s): CKTOTAL, CKMB, CKMBINDEX, TROPONINI in the last 168 hours.  BNP (last 3 results) No results for input(s): BNP in the last 8760 hours.  ProBNP (last 3 results) No results for input(s): PROBNP in the last 8760 hours.  CBG: No results for input(s): GLUCAP in the last 168 hours.  Radiological Exams on Admission: Ct  Abdomen Pelvis W Contrast  Result Date: 01/19/2019 CLINICAL DATA:  Abdominal pain with swelling for 3 days. EXAM: CT ABDOMEN AND PELVIS WITH CONTRAST TECHNIQUE: Multidetector CT imaging of the abdomen and pelvis was performed using the standard protocol following bolus administration of intravenous contrast. CONTRAST:  137mL ISOVUE-300 IOPAMIDOL (ISOVUE-300) INJECTION 61% COMPARISON:  CT, 06/20/2006 FINDINGS: Lower chest: No acute abnormality. Hepatobiliary: 2 cm partial fat density lesion at the dome the left liver with associated calcification. Additional low-density lesions seen at the dome of the right lobe, next largest 1.4 cm. No other liver masses or lesions. Normal gallbladder. No bile duct dilation. Pancreas: Unremarkable. No pancreatic ductal dilatation or surrounding inflammatory changes. Spleen: Normal in size without focal abnormality. Adrenals/Urinary Tract: No adrenal masses. Kidneys normal in size, orientation and position with symmetric  enhancement and excretion. Small low-density renal lesions are noted too small to fully characterize, but likely cysts. 1 mm nonobstructing stone in the lower pole of the left kidney. No hydronephrosis. Ureters normal in course and in caliber. Bladder is unremarkable. Stomach/Bowel: Stomach is unremarkable. There is dilated small bowel, maximum dilation 4.2 cm, with 2 possible transition points in the pelvis, followed by distal decompressed small bowel. The colon is mostly decompressed. There is no bowel wall thickening. No inflammation Vascular/Lymphatic: Aortic atherosclerosis. No aneurysm. No enlarged lymph nodes. Reproductive: Status post hysterectomy. No adnexal masses. Other: Trace ascites noted adjacent to the liver. Musculoskeletal: No fracture or acute finding. No osteoblastic or osteolytic lesions. IMPRESSION: 1. Partial small bowel obstruction, apparent transition point in the pelvis, likely from adhesions. 2. No other acute abnormality within the  abdomen or pelvis. 3. Low-density lesions at the dome of the liver which are new since the prior CT, but appear benign. 4. Small low-density renal lesions, too small to fully characterize but likely cysts. Tiny nonobstructing left intrarenal stone. 5. Aortic atherosclerosis. Electronically Signed   By: Lajean Manes M.D.   On: 01/19/2019 13:54   Dg Abd Portable 1v-small Bowel Protocol-position Verification  Result Date: 01/19/2019 CLINICAL DATA:  Check gastric catheter placement EXAM: PORTABLE ABDOMEN - 1 VIEW COMPARISON:  Film from earlier in the same day. FINDINGS: Gastric catheter is been further advanced into the stomach. Multiple dilated loops of small bowel are noted although slightly improved when compared with the prior exam. IMPRESSION: Nasogastric catheter within the stomach. Slight improvement in the degree of small bowel dilatation when compare with the prior study. Electronically Signed   By: Inez Catalina M.D.   On: 01/19/2019 17:22   Dg Abd Portable 1 View  Result Date: 01/19/2019 CLINICAL DATA:  NG tube placement. EXAM: PORTABLE ABDOMEN - 1 VIEW COMPARISON:  None. FINDINGS: NG tube is identified with tip overlying the proximal stomach. The side hole size overlies the EG junction. IMPRESSION: NG tube with tip overlying the proximal stomach and side hole overlying the EG junction. Recommend advancement. Electronically Signed   By: Margarette Canada M.D.   On: 01/19/2019 15:17    EKG: I independently viewed the EKG done and my findings are as followed: Sinus tachycardia with rate of 111.  QTC prolongation 564.  No specific ST-T changes.  Assessment/Plan Present on Admission:  SBO (small bowel obstruction) (HCC)  Active Problems:   SBO (small bowel obstruction) (HCC)   Partial small bowel obstruction likely secondary to adhesions Presented with significant abdominal pain with vomiting and no flatus x4 days CT abdomen and pelvis with contrast showed partial small bowel obstruction with  transition point in the pelvis likely from adhesions. General surgery has been consulted by EDP and is following NG tube in place for decompression Optimize pain control NPO Continue IV fluid hydration Repeat abdominal x-ray in the morning Closely monitor vital signs and electrolytes  Sinus tachycardia likely secondary to dehydration Twelve-lead EKG independently reviewed showed sinus tachycardia with rate of 111 with no specific ST-T changes. Continue IV fluid hydration Continue to monitor vital signs  Hyperbilirubinemia, likely related to her partial small bowel obstruction Total bilirubin 1.7 AST, ALT and alkaline phosphatase normal Hemoglobin and platelet count normal  No sign of bleeding Repeat CMP in a.m.  Prolonged QTC Avoid QTC prolonging agents Repeat twelve-lead EKG in the morning  Hyperglycemia Not on oral anti-diabetic medications Obtain a hemoglobin A1c Start sensitive insulin sliding scale when no longer NPO  Essential hypertension Blood pressure is stable Continue Norvasc when no longer NPO  Hyperlipidemia Continue Lipitor when no longer NPO  Chronic depression/anxiety/bipolar disorder Resume Cymbalta and lithium when no longer NPO  Low-density lesions in dome of liver, new Surveillance  Likely renal cysts Too small to fully characterize Surveillance  Nonobstructive left nephrolithiasis Asymptomatic Continue IV fluid hydration   Risks: Patient is high risk for decompensation due to partial small bowel obstruction, sinus tachycardia likely secondary to dehydration, multiple comorbidities and advanced age.  Patient will require least 2 midnights for further assessment and treatment of present condition.  DVT prophylaxis: Subcu Lovenox daily  Code Status: Full code  Family Communication: We will call family if okay with the patient  Disposition Plan: Admit to telemetry unit  Consults called: General surgery has been consulted by  EDP  Admission status: Inpatient    Kayleen Memos MD [facility] Pager [phone]  If 7PM-7AM, please contact night-coverage [email or web address] Password [facility]  01/19/2019, 5:36 PM

## 2019-01-19 NOTE — Consult Note (Signed)
[facility] Surgery Consult/Admission Note  [name] 02/24/1951  [id number].    Requesting Provider: Alecia Lemming, PA-C Chief Complaint/Reason for Consult: SBO  HPI:   Pt is a 68 yo female with a hx of HTN, depression and anxiety on lithium, ovarian cyst removal, and abdominal hysterectomy who presented to the ED with complaints of abdominal pain, bloating, nausea and vomiting.  Patient states starting early Tuesday morning she vomited after breakfast.  She then began having generalized abdominal pain and bloating.  This progressively worsened.  She was unable to keep any solid foods down, but was able to keep water down.  Her last bowel movement was on Sunday and it was normal.  She has passed very little flatus since onset of symptoms.  Emesis has always been what she is eaten.  She denies any bilious emesis.  She denies fever, chills, SOB.  She tried laxatives and enema without relief.  Bloating has improved since insertion of NG tube.  Patient denies anticoagulation.  Allergy to penicillin.   ROS:  Review of Systems  Constitutional: Negative for chills, diaphoresis and fever.  HENT: Negative for sore throat.   Respiratory: Negative for cough and shortness of breath.   Cardiovascular: Negative for chest pain.  Gastrointestinal: Positive for abdominal pain, constipation, nausea and vomiting. Negative for blood in stool and diarrhea.  Genitourinary: Negative for dysuria.  Skin: Negative for rash.  Neurological: Negative for dizziness and loss of consciousness.  All other systems reviewed and are negative.    Family History  Problem Relation Age of Onset  . Colon cancer Neg Hx     Past Medical History:  Diagnosis Date  . Anxiety   . Depression   . Hx of adenomatous colonic polyps 05/14/2015  . Hypertension   . Papilloma of breast    right  . Prolapsed internal hemorrhoids, grade 3 05/09/2015  . Vitamin D deficiency     Past Surgical History:  Procedure  Laterality Date  . ABDOMINAL HYSTERECTOMY    . ANKLE FRACTURE SURGERY    . BREAST LUMPECTOMY WITH RADIOACTIVE SEED LOCALIZATION Right 05/06/2015   Procedure: RIGHT BREAST LUMPECTOMY WITH RADIOACTIVE SEED LOCALIZATION;  Surgeon: Coralie Keens, MD;  Location: [place];  Service: General;  Laterality: Right;  . COLONOSCOPY    . ESOPHAGOGASTRODUODENOSCOPY    . FINGER FRACTURE SURGERY    . HEMORRHOID BANDING    . OVARIAN CYST REMOVAL    . TONSILLECTOMY AND ADENOIDECTOMY      Social History:  reports that she has never smoked. She has never used smokeless tobacco. She reports that she does not drink alcohol or use drugs.  Allergies:  Allergies  Allergen Reactions  . Nickel Rash  . Penicillins Rash    Did it involve swelling of the face/tongue/throat, SOB, or low BP? No Did it involve sudden or severe rash/hives, skin peeling, or any reaction on the inside of your mouth or nose? No Did you need to seek medical attention at a hospital or doctor's office? No When did it last happen? If all above answers are "NO", may proceed with cephalosporin use.  Nilsa Nutting Alcohol [Lanolin] Rash    (Not in a hospital admission)   Blood pressure (!) 132/92, pulse (!) 109, temperature 98.6 F (37 C), temperature source Oral, resp. rate 18, height 5\' 2"  (1.575 m), weight 83.9 kg, SpO2 96 %.  Physical Exam Vitals signs and nursing note reviewed.  Constitutional:      General: She is  not in acute distress.    Appearance: Normal appearance. She is not diaphoretic.  HENT:     Head: Normocephalic and atraumatic.     Nose: Nose normal.     Mouth/Throat:     Lips: Pink.     Mouth: Mucous membranes are moist.     Pharynx: Oropharynx is clear.  Eyes:     General: No scleral icterus.       Right eye: No discharge.        Left eye: No discharge.     Conjunctiva/sclera: Conjunctivae normal.     Pupils: Pupils are equal, round, and reactive to light.  Neck:     Musculoskeletal:  Normal range of motion and neck supple.  Cardiovascular:     Rate and Rhythm: Normal rate and regular rhythm.     Pulses:          Radial pulses are 2+ on the right side and 2+ on the left side.       Dorsalis pedis pulses are 2+ on the right side and 2+ on the left side.     Heart sounds: Normal heart sounds. No murmur.  Pulmonary:     Effort: Pulmonary effort is normal. No respiratory distress.     Breath sounds: Normal breath sounds. No wheezing, rhonchi or rales.  Abdominal:     General: Bowel sounds are decreased. There is distension (Mild).     Palpations: Abdomen is soft. Abdomen is not rigid. There is no hepatomegaly or splenomegaly.     Tenderness: There is generalized abdominal tenderness. There is no guarding.     Hernia: No hernia is present.  Musculoskeletal: Normal range of motion.        General: No tenderness or deformity.     Right lower leg: No edema.     Left lower leg: No edema.  Skin:    General: Skin is warm and dry.     Findings: No rash.  Neurological:     Mental Status: She is alert and oriented to person, place, and time.  Psychiatric:        Mood and Affect: Mood normal.        Behavior: Behavior normal.     Results for orders placed or performed during the hospital encounter of 01/19/19 (from the past 48 hour(s))  Lipase, blood     Status: None   Collection Time: 01/19/19 10:46 AM  Result Value Ref Range   Lipase 31 11 - 51 U/L    Comment: Performed at [facility], [place] [address], [place], [place] [address]  Comprehensive metabolic panel     Status: Abnormal   Collection Time: 01/19/19 10:46 AM  Result Value Ref Range   Sodium 137 135 - 145 mmol/L   Potassium 3.7 3.5 - 5.1 mmol/L   Chloride 107 98 - 111 mmol/L   CO2 21 (L) 22 - 32 mmol/L   Glucose, Bld 125 (H) 70 - 99 mg/dL   BUN 9 8 - 23 mg/dL   Creatinine, Ser 1.06 (H) 0.44 - 1.00 mg/dL   Calcium 10.3 8.9 - 10.3 mg/dL   Total Protein 6.8 6.5 - 8.1 g/dL   Albumin 3.9 3.5 - 5.0 g/dL    AST 13 (L) 15 - 41 U/L   ALT 12 0 - 44 U/L   Alkaline Phosphatase 87 38 - 126 U/L   Total Bilirubin 1.7 (H) 0.3 - 1.2 mg/dL   GFR calc non Af Amer 54 (L) >  60 mL/min   GFR calc Af Amer >60 >60 mL/min   Anion gap 9 5 - 15    Comment: Performed at [facility] [address], [place], [place] [address]  CBC     Status: Abnormal   Collection Time: 01/19/19 10:46 AM  Result Value Ref Range   WBC 6.1 4.0 - 10.5 K/uL   RBC 4.20 3.87 - 5.11 MIL/uL   Hemoglobin 12.2 12.0 - 15.0 g/dL   HCT 38.0 36.0 - 46.0 %   MCV 90.5 80.0 - 100.0 fL   MCH 29.0 26.0 - 34.0 pg   MCHC 32.1 30.0 - 36.0 g/dL   RDW 16.8 (H) 11.5 - 15.5 %   Platelets 458 (H) 150 - 400 K/uL   nRBC 0.0 0.0 - 0.2 %    Comment: Performed at [facility], [place] [address], [place], [place] [address]  Urinalysis, Routine w reflex microscopic     Status: Abnormal   Collection Time: 01/19/19 10:46 AM  Result Value Ref Range   Color, Urine YELLOW YELLOW   APPearance CLEAR CLEAR   Specific Gravity, Urine 1.012 1.005 - 1.030   pH 6.0 5.0 - 8.0   Glucose, UA NEGATIVE NEGATIVE mg/dL   Hgb urine dipstick NEGATIVE NEGATIVE   Bilirubin Urine NEGATIVE NEGATIVE   Ketones, ur NEGATIVE NEGATIVE mg/dL   Protein, ur 100 (A) NEGATIVE mg/dL   Nitrite NEGATIVE NEGATIVE   Leukocytes,Ua NEGATIVE NEGATIVE   RBC / HPF 0-5 0 - 5 RBC/hpf   WBC, UA 0-5 0 - 5 WBC/hpf   Bacteria, UA RARE (A) NONE SEEN   Squamous Epithelial / LPF 6-10 0 - 5    Comment: Performed at [facility], [place] [address] [place], [place] [address]  Lithium level     Status: Abnormal   Collection Time: 01/19/19 10:54 AM  Result Value Ref Range   Lithium Lvl 0.33 (L) 0.60 - 1.20 mmol/L    Comment: Performed at [facility] [address], [place],  [address]   Ct Abdomen Pelvis W Contrast  Result Date: 01/19/2019 CLINICAL DATA:  Abdominal pain with swelling for 3 days. EXAM: CT ABDOMEN AND PELVIS WITH CONTRAST TECHNIQUE: Multidetector CT imaging  of the abdomen and pelvis was performed using the standard protocol following bolus administration of intravenous contrast. CONTRAST:  167mL ISOVUE-300 IOPAMIDOL (ISOVUE-300) INJECTION 61% COMPARISON:  CT, 06/20/2006 FINDINGS: Lower chest: No acute abnormality. Hepatobiliary: 2 cm partial fat density lesion at the dome the left liver with associated calcification. Additional low-density lesions seen at the dome of the right lobe, next largest 1.4 cm. No other liver masses or lesions. Normal gallbladder. No bile duct dilation. Pancreas: Unremarkable. No pancreatic ductal dilatation or surrounding inflammatory changes. Spleen: Normal in size without focal abnormality. Adrenals/Urinary Tract: No adrenal masses. Kidneys normal in size, orientation and position with symmetric enhancement and excretion. Small low-density renal lesions are noted too small to fully characterize, but likely cysts. 1 mm nonobstructing stone in the lower pole of the left kidney. No hydronephrosis. Ureters normal in course and in caliber. Bladder is unremarkable. Stomach/Bowel: Stomach is unremarkable. There is dilated small bowel, maximum dilation 4.2 cm, with 2 possible transition points in the pelvis, followed by distal decompressed small bowel. The colon is mostly decompressed. There is no bowel wall thickening. No inflammation Vascular/Lymphatic: Aortic atherosclerosis. No aneurysm. No enlarged lymph nodes. Reproductive: Status post hysterectomy. No adnexal masses. Other: Trace ascites noted adjacent to the liver.  Musculoskeletal: No fracture or acute finding. No osteoblastic or osteolytic lesions. IMPRESSION: 1. Partial small bowel obstruction, apparent transition point in the pelvis, likely from adhesions. 2. No other acute abnormality within the abdomen or pelvis. 3. Low-density lesions at the dome of the liver which are new since the prior CT, but appear benign. 4. Small low-density renal lesions, too small to fully characterize but  likely cysts. Tiny nonobstructing left intrarenal stone. 5. Aortic atherosclerosis. Electronically Signed   By: Lajean Manes M.D.   On: 01/19/2019 13:54   Dg Abd Portable 1 View  Result Date: 01/19/2019 CLINICAL DATA:  NG tube placement. EXAM: PORTABLE ABDOMEN - 1 VIEW COMPARISON:  None. FINDINGS: NG tube is identified with tip overlying the proximal stomach. The side hole size overlies the EG junction. IMPRESSION: NG tube with tip overlying the proximal stomach and side hole overlying the EG junction. Recommend advancement. Electronically Signed   By: Margarette Canada M.D.   On: 01/19/2019 15:17      Assessment/Plan Active Problems:   * No active hospital problems. *  Partial SBO - CT scan showed partial small bowel obstruction with apparent transition point in the pelvis - NG tube in place, x-ray completed, recommending advancement - will place patient on small bowel protocol - NPO, IVF, minimal ice chips okay - We will follow  Thank you for the consult.   Kalman Drape, [facility] Surgery 01/19/2019, 3:35 PM Pager: [phone] Consults: [phone] Mon-Fri 7:00 am-4:30 pm Sat-Sun 7:00 am-11:30 am

## 2019-01-19 NOTE — ED Notes (Signed)
Advanced the ng tube and called for another verification.

## 2019-01-20 ENCOUNTER — Inpatient Hospital Stay (HOSPITAL_COMMUNITY): Payer: Medicare Other

## 2019-01-20 DIAGNOSIS — E785 Hyperlipidemia, unspecified: Secondary | ICD-10-CM

## 2019-01-20 DIAGNOSIS — I10 Essential (primary) hypertension: Secondary | ICD-10-CM

## 2019-01-20 LAB — CBC WITH DIFFERENTIAL/PLATELET
Abs Immature Granulocytes: 0 10*3/uL (ref 0.00–0.07)
Basophils Absolute: 0 10*3/uL (ref 0.0–0.1)
Basophils Relative: 1 %
Eosinophils Absolute: 0.4 10*3/uL (ref 0.0–0.5)
Eosinophils Relative: 9 %
HCT: 35.6 % — ABNORMAL LOW (ref 36.0–46.0)
Hemoglobin: 11.2 g/dL — ABNORMAL LOW (ref 12.0–15.0)
Lymphocytes Relative: 15 %
Lymphs Abs: 0.6 10*3/uL — ABNORMAL LOW (ref 0.7–4.0)
MCH: 28.9 pg (ref 26.0–34.0)
MCHC: 31.5 g/dL (ref 30.0–36.0)
MCV: 91.8 fL (ref 80.0–100.0)
Monocytes Absolute: 0.3 10*3/uL (ref 0.1–1.0)
Monocytes Relative: 8 %
Myelocytes: 1 %
Neutro Abs: 2.7 10*3/uL (ref 1.7–7.7)
Neutrophils Relative %: 66 %
Platelets: 420 10*3/uL — ABNORMAL HIGH (ref 150–400)
RBC: 3.88 MIL/uL (ref 3.87–5.11)
RDW: 17.2 % — ABNORMAL HIGH (ref 11.5–15.5)
WBC: 4.1 10*3/uL (ref 4.0–10.5)
nRBC: 0 % (ref 0.0–0.2)
nRBC: 1 /100 WBC — ABNORMAL HIGH

## 2019-01-20 LAB — COMPREHENSIVE METABOLIC PANEL
ALT: 12 U/L (ref 0–44)
AST: 12 U/L — ABNORMAL LOW (ref 15–41)
Albumin: 3.6 g/dL (ref 3.5–5.0)
Alkaline Phosphatase: 78 U/L (ref 38–126)
Anion gap: 8 (ref 5–15)
BUN: 8 mg/dL (ref 8–23)
CO2: 25 mmol/L (ref 22–32)
Calcium: 10 mg/dL (ref 8.9–10.3)
Chloride: 110 mmol/L (ref 98–111)
Creatinine, Ser: 0.97 mg/dL (ref 0.44–1.00)
GFR calc Af Amer: >60 mL/min (ref >60)
GFR calc non Af Amer: 60 mL/min — ABNORMAL LOW (ref >60)
Glucose, Bld: 137 mg/dL — ABNORMAL HIGH (ref 70–99)
Potassium: 3.6 mmol/L (ref 3.5–5.1)
Sodium: 143 mmol/L (ref 135–145)
Total Bilirubin: 1.2 mg/dL (ref 0.3–1.2)
Total Protein: 6.8 g/dL (ref 6.5–8.1)

## 2019-01-20 LAB — PHOSPHORUS: Phosphorus: 3.5 mg/dL (ref 2.5–4.6)

## 2019-01-20 LAB — MAGNESIUM: Magnesium: 2.4 mg/dL (ref 1.7–2.4)

## 2019-01-20 NOTE — Progress Notes (Signed)
Md stated that  Its ok to keep patient clamped but if she gets sick , we can put her back to suction.

## 2019-01-20 NOTE — Progress Notes (Signed)
At  about 1730 , patient complaining of nausea, NGT connected to suction

## 2019-01-20 NOTE — Progress Notes (Signed)
PROGRESS NOTE    [name]   IRC:[id number]  DOB: [birth year]  DOA: 01/19/2019 PCP: Gregor Hams, FNP   Brief Narrative:  [name] is a 68 y.o. female with medical history significant for gynecological abdominal surgeries x2, essential hypertension, chronic depression/anxiety/bipolar disorder, who presented to [facility] ED with complaints of severe abdominal pain associated with nausea and vomiting x4 days.  CT abdomen and pelvis with contrast revealed partial small bowel obstruction.    Subjective: No nausea or abdominal pain. Not passing gas and no BM.     Assessment & Plan:   Principal Problem:   SBO (small bowel obstruction)  - cont NG tube per general surgery -cont IVF  Active Problems:   HTN (hypertension) - she takes Amlodipine at at home- currently BP is normal- Labetalol ordered PRN    HLD (hyperlipidemia) - hold Lipitor  Anxiety/depression - holding Cymbalta and Lithium   Time spent in minutes: 35 DVT prophylaxis: Lovenox Code Status: Full code Family Communication:  Disposition Plan: home when stable Consultants:   General surgery Procedures:   NG tube Antimicrobials:  Anti-infectives (From admission, onward)   None       Objective: Vitals:   01/19/19 1707 01/19/19 1725 01/19/19 2048 01/20/19 0502  BP: 122/88 127/87 118/82 122/83  Pulse: (!) 118 (!) 113 (!) 108 99  Resp: 13 16 16    Temp:  98.8 F (37.1 C) 98.4 F (36.9 C) 97.8 F (36.6 C)  TempSrc:  Oral Oral Oral  SpO2: 96% 99% 97% 98%  Weight:  82.7 kg    Height:  5\' 2"  (1.575 m)      Intake/Output Summary (Last 24 hours) at 01/20/2019 1111 Last data filed at 01/20/2019 0900 Gross per 24 hour  Intake 2171.91 ml  Output 1402 ml  Net 769.91 ml   Filed Weights   01/19/19 1042 01/19/19 1725  Weight: 83.9 kg 82.7 kg    Examination: General exam: Appears comfortable  HEENT: PERRLA, oral mucosa moist, no sclera icterus or thrush Respiratory system: Clear to  auscultation. Respiratory effort normal. Cardiovascular system: S1 & S2 heard, RRR.   Gastrointestinal system: Abdomen soft, non-tender, moderately distended, high pitched bowel sounds. Central nervous system: Alert and oriented. No focal neurological deficits. Extremities: No cyanosis, clubbing or edema Skin: No rashes or ulcers Psychiatry:  Mood & affect appropriate.     Data Reviewed: I have personally reviewed following labs and imaging studies  CBC: Recent Labs  Lab 01/19/19 1046 01/20/19 0223  WBC 6.1 4.1  NEUTROABS  --  2.7  HGB 12.2 11.2*  HCT 38.0 35.6*  MCV 90.5 91.8  PLT 458* 510*   Basic Metabolic Panel: Recent Labs  Lab 01/19/19 1046 01/20/19 0223  NA 137 143  K 3.7 3.6  CL 107 110  CO2 21* 25  GLUCOSE 125* 137*  BUN 9 8  CREATININE 1.06* 0.97  CALCIUM 10.3 10.0  MG  --  2.4  PHOS  --  3.5   GFR: Estimated Creatinine Clearance: 54.5 mL/min (by C-G formula based on SCr of 0.97 mg/dL). Liver Function Tests: Recent Labs  Lab 01/19/19 1046 01/20/19 0223  AST 13* 12*  ALT 12 12  ALKPHOS 87 78  BILITOT 1.7* 1.2  PROT 6.8 6.8  ALBUMIN 3.9 3.6   Recent Labs  Lab 01/19/19 1046  LIPASE 31   No results for input(s): AMMONIA in the last 168 hours. Coagulation Profile: No results for input(s): INR, PROTIME in the last 168 hours.  Cardiac Enzymes: No results for input(s): CKTOTAL, CKMB, CKMBINDEX, TROPONINI in the last 168 hours. BNP (last 3 results) No results for input(s): PROBNP in the last 8760 hours. HbA1C: Recent Labs    01/19/19 1046  HGBA1C 6.3*   CBG: No results for input(s): GLUCAP in the last 168 hours. Lipid Profile: No results for input(s): CHOL, HDL, LDLCALC, TRIG, CHOLHDL, LDLDIRECT in the last 72 hours. Thyroid Function Tests: No results for input(s): TSH, T4TOTAL, FREET4, T3FREE, THYROIDAB in the last 72 hours. Anemia Panel: No results for input(s): VITAMINB12, FOLATE, FERRITIN, TIBC, IRON, RETICCTPCT in the last 72 hours.  Urine analysis:    Component Value Date/Time   COLORURINE YELLOW 01/19/2019 1046   APPEARANCEUR CLEAR 01/19/2019 1046   LABSPEC 1.012 01/19/2019 1046   PHURINE 6.0 01/19/2019 1046   GLUCOSEU NEGATIVE 01/19/2019 1046   HGBUR NEGATIVE 01/19/2019 [place] 01/19/2019 1046   [place] 01/19/2019 1046   PROTEINUR 100 (A) 01/19/2019 1046   UROBILINOGEN 0.2 03/18/2011 2118   NITRITE NEGATIVE 01/19/2019 1046   LEUKOCYTESUR NEGATIVE 01/19/2019 1046   Sepsis Labs: @LABRCNTIP (procalcitonin:4,lacticidven:4) ) Recent Results (from the past 240 hour(s))  SARS Coronavirus 2 (CEPHEID - Performed in [place] hospital lab), Hosp Order     Status: None   Collection Time: 01/19/19  2:47 PM   Specimen: Nasopharyngeal Swab  Result Value Ref Range Status   SARS Coronavirus 2 NEGATIVE NEGATIVE Final    Comment: (NOTE) If result is NEGATIVE SARS-CoV-2 target nucleic acids are NOT DETECTED. The SARS-CoV-2 RNA is generally detectable in upper and lower  respiratory specimens during the acute phase of infection. The lowest  concentration of SARS-CoV-2 viral copies this assay can detect is 250  copies / mL. A negative result does not preclude SARS-CoV-2 infection  and should not be used as the sole basis for treatment or other  patient management decisions.  A negative result may occur with  improper specimen collection / handling, submission of specimen other  than nasopharyngeal swab, presence of viral mutation(s) within the  areas targeted by this assay, and inadequate number of viral copies  (<250 copies / mL). A negative result must be combined with clinical  observations, patient history, and epidemiological information. If result is POSITIVE SARS-CoV-2 target nucleic acids are DETECTED. The SARS-CoV-2 RNA is generally detectable in upper and lower  respiratory specimens dur ing the acute phase of infection.  Positive  results are indicative of active infection  with SARS-CoV-2.  Clinical  correlation with patient history and other diagnostic information is  necessary to determine patient infection status.  Positive results do  not rule out bacterial infection or co-infection with other viruses. If result is PRESUMPTIVE POSTIVE SARS-CoV-2 nucleic acids MAY BE PRESENT.   A presumptive positive result was obtained on the submitted specimen  and confirmed on repeat testing.  While 2019 novel coronavirus  (SARS-CoV-2) nucleic acids may be present in the submitted sample  additional confirmatory testing may be necessary for epidemiological  and / or clinical management purposes  to differentiate between  SARS-CoV-2 and other Sarbecovirus currently known to infect humans.  If clinically indicated additional testing with an alternate test  methodology [phone]) is advised. The SARS-CoV-2 RNA is generally  detectable in upper and lower respiratory sp ecimens during the acute  phase of infection. The expected result is Negative. Fact Sheet for Patients:  StrictlyIdeas.no Fact Sheet for Healthcare Providers: BankingDealers.co.za This test is not yet approved or cleared by the [place]  FDA and has been authorized for detection and/or diagnosis of SARS-CoV-2 by FDA under an Emergency Use Authorization (EUA).  This EUA will remain in effect (meaning this test can be used) for the duration of the COVID-19 declaration under Section 564(b)(1) of the Act, 21 U.S.C. section 360bbb-3(b)(1), unless the authorization is terminated or revoked sooner. Performed at [facility], [place] [address], [place], [place] [address]          Radiology Studies: Ct Abdomen Pelvis W Contrast  Result Date: 01/19/2019 CLINICAL DATA:  Abdominal pain with swelling for 3 days. EXAM: CT ABDOMEN AND PELVIS WITH CONTRAST TECHNIQUE: Multidetector CT imaging of the abdomen and pelvis was performed using the standard protocol  following bolus administration of intravenous contrast. CONTRAST:  117mL ISOVUE-300 IOPAMIDOL (ISOVUE-300) INJECTION 61% COMPARISON:  CT, 06/20/2006 FINDINGS: Lower chest: No acute abnormality. Hepatobiliary: 2 cm partial fat density lesion at the dome the left liver with associated calcification. Additional low-density lesions seen at the dome of the right lobe, next largest 1.4 cm. No other liver masses or lesions. Normal gallbladder. No bile duct dilation. Pancreas: Unremarkable. No pancreatic ductal dilatation or surrounding inflammatory changes. Spleen: Normal in size without focal abnormality. Adrenals/Urinary Tract: No adrenal masses. Kidneys normal in size, orientation and position with symmetric enhancement and excretion. Small low-density renal lesions are noted too small to fully characterize, but likely cysts. 1 mm nonobstructing stone in the lower pole of the left kidney. No hydronephrosis. Ureters normal in course and in caliber. Bladder is unremarkable. Stomach/Bowel: Stomach is unremarkable. There is dilated small bowel, maximum dilation 4.2 cm, with 2 possible transition points in the pelvis, followed by distal decompressed small bowel. The colon is mostly decompressed. There is no bowel wall thickening. No inflammation Vascular/Lymphatic: Aortic atherosclerosis. No aneurysm. No enlarged lymph nodes. Reproductive: Status post hysterectomy. No adnexal masses. Other: Trace ascites noted adjacent to the liver. Musculoskeletal: No fracture or acute finding. No osteoblastic or osteolytic lesions. IMPRESSION: 1. Partial small bowel obstruction, apparent transition point in the pelvis, likely from adhesions. 2. No other acute abnormality within the abdomen or pelvis. 3. Low-density lesions at the dome of the liver which are new since the prior CT, but appear benign. 4. Small low-density renal lesions, too small to fully characterize but likely cysts. Tiny nonobstructing left intrarenal stone. 5. Aortic  atherosclerosis. Electronically Signed   By: Lajean Manes M.D.   On: 01/19/2019 13:54   Dg Abd Portable 1v-small Bowel Obstruction Protocol-initial, 8 Hr Delay  Result Date: 01/20/2019 CLINICAL DATA:  68 year old female with small bowel obstruction. EXAM: PORTABLE ABDOMEN - 1 VIEW COMPARISON:  Earlier radiograph dated 01/19/2019 FINDINGS: Partially visualized enteric tube with tip in the distal stomach. Interval improvement of the dilatation of the small bowel compared to the prior radiograph. A mildly dilated loop of bowel is noted in the left upper abdomen measuring up to 3.6 cm. The osseous structures and soft tissues are grossly unremarkable. IMPRESSION: Interval improvement of the small-bowel obstruction. Electronically Signed   By: Anner Crete M.D.   On: 01/20/2019 02:15   Dg Abd Portable 1v-small Bowel Protocol-position Verification  Result Date: 01/19/2019 CLINICAL DATA:  Check gastric catheter placement EXAM: PORTABLE ABDOMEN - 1 VIEW COMPARISON:  Film from earlier in the same day. FINDINGS: Gastric catheter is been further advanced into the stomach. Multiple dilated loops of small bowel are noted although slightly improved when compared with the prior exam. IMPRESSION: Nasogastric catheter within the stomach. Slight improvement in the degree  of small bowel dilatation when compare with the prior study. Electronically Signed   By: Inez Catalina M.D.   On: 01/19/2019 17:22   Dg Abd Portable 1 View  Result Date: 01/19/2019 CLINICAL DATA:  NG tube placement. EXAM: PORTABLE ABDOMEN - 1 VIEW COMPARISON:  None. FINDINGS: NG tube is identified with tip overlying the proximal stomach. The side hole size overlies the EG junction. IMPRESSION: NG tube with tip overlying the proximal stomach and side hole overlying the EG junction. Recommend advancement. Electronically Signed   By: Margarette Canada M.D.   On: 01/19/2019 15:17      Scheduled Meds: . enoxaparin (LOVENOX) injection  40 mg Subcutaneous  Q24H   Continuous Infusions: . dextrose 5% lactated ringers 100 mL/hr at 01/20/19 0515     LOS: 1 day      Debbe Odea, MD [facility] Pager: [email or web address] Password [email or web address] 01/20/2019, 11:11 AM

## 2019-01-20 NOTE — Progress Notes (Signed)
Patient ID: [name], female   DOB: 10/20/1950, 68 y.o.   MRN: [id number] [facility] Progress Note:   * No surgery found *  Subjective: Mental status is clear; NG in place Objective: Vital signs in last 24 hours: Temp:  [97.8 F (36.6 C)-98.8 F (37.1 C)] 97.8 F (36.6 C) (07/25 0502) Pulse Rate:  [99-118] 99 (07/25 0502) Resp:  [13-18] 16 (07/24 2048) BP: (118-132)/(82-94) 122/83 (07/25 0502) SpO2:  [96 %-99 %] 98 % (07/25 0502) Weight:  [82.7 kg] 82.7 kg (07/24 1725)  Intake/Output from previous day: 07/24 0701 - 07/25 0700 In: 2111.9 [I.V.:1131.9; NG/GT:30; IV Piggyback:950] Out: 1402 [Urine:502; Emesis/NG output:900] Intake/Output this shift: Total I/O In: 60 [P.O.:60] Out: -   Physical Exam: Work of breathing is normal;  Abdomen is mildly distended and nontender to palpation.  No flatus yet.    Lab Results:  Results for orders placed or performed during the hospital encounter of 01/19/19 (from the past 48 hour(s))  Lipase, blood     Status: None   Collection Time: 01/19/19 10:46 AM  Result Value Ref Range   Lipase 31 11 - 51 U/L    Comment: Performed at [facility], [place] [address] [place], [place] [address]  Comprehensive metabolic panel     Status: Abnormal   Collection Time: 01/19/19 10:46 AM  Result Value Ref Range   Sodium 137 135 - 145 mmol/L   Potassium 3.7 3.5 - 5.1 mmol/L   Chloride 107 98 - 111 mmol/L   CO2 21 (L) 22 - 32 mmol/L   Glucose, Bld 125 (H) 70 - 99 mg/dL   BUN 9 8 - 23 mg/dL   Creatinine, Ser 1.06 (H) 0.44 - 1.00 mg/dL   Calcium 10.3 8.9 - 10.3 mg/dL   Total Protein 6.8 6.5 - 8.1 g/dL   Albumin 3.9 3.5 - 5.0 g/dL   AST 13 (L) 15 - 41 U/L   ALT 12 0 - 44 U/L   Alkaline Phosphatase 87 38 - 126 U/L   Total Bilirubin 1.7 (H) 0.3 - 1.2 mg/dL   GFR calc non Af Amer 54 (L) >60 mL/min   GFR calc Af Amer >60 >60 mL/min   Anion gap 9 5 - 15    Comment: Performed at [facility], [place] [address] [place],  [place] [address]  CBC     Status: Abnormal   Collection Time: 01/19/19 10:46 AM  Result Value Ref Range   WBC 6.1 4.0 - 10.5 K/uL   RBC 4.20 3.87 - 5.11 MIL/uL   Hemoglobin 12.2 12.0 - 15.0 g/dL   HCT 38.0 36.0 - 46.0 %   MCV 90.5 80.0 - 100.0 fL   MCH 29.0 26.0 - 34.0 pg   MCHC 32.1 30.0 - 36.0 g/dL   RDW 16.8 (H) 11.5 - 15.5 %   Platelets 458 (H) 150 - 400 K/uL   nRBC 0.0 0.0 - 0.2 %    Comment: Performed at [facility], [place] [address], [place], [place] [address]  Urinalysis, Routine w reflex microscopic     Status: Abnormal   Collection Time: 01/19/19 10:46 AM  Result Value Ref Range   Color, Urine YELLOW YELLOW   APPearance CLEAR CLEAR   Specific Gravity, Urine 1.012 1.005 - 1.030   pH 6.0 5.0 - 8.0   Glucose, UA NEGATIVE NEGATIVE mg/dL   Hgb urine dipstick NEGATIVE NEGATIVE   Bilirubin Urine NEGATIVE NEGATIVE   Ketones, ur  NEGATIVE NEGATIVE mg/dL   Protein, ur 100 (A) NEGATIVE mg/dL   Nitrite NEGATIVE NEGATIVE   Leukocytes,Ua NEGATIVE NEGATIVE   RBC / HPF 0-5 0 - 5 RBC/hpf   WBC, UA 0-5 0 - 5 WBC/hpf   Bacteria, UA RARE (A) NONE SEEN   Squamous Epithelial / LPF 6-10 0 - 5    Comment: Performed at [facility], [place] [address] [place], [place] [address]  Hemoglobin A1c     Status: Abnormal   Collection Time: 01/19/19 10:46 AM  Result Value Ref Range   Hgb A1c MFr Bld 6.3 (H) 4.8 - 5.6 %    Comment: REPEATED TO VERIFY (NOTE) Pre diabetes:          5.7%-6.4% Diabetes:              >6.4% Glycemic control for   <7.0% adults with diabetes    Mean Plasma Glucose 134.11 mg/dL    Comment: Performed at [facility] [address], [place], [place] [address]  Lithium level     Status: Abnormal   Collection Time: 01/19/19 10:54 AM  Result Value Ref Range   Lithium Lvl 0.33 (L) 0.60 - 1.20 mmol/L    Comment: Performed at [facility] [address], [place], [place] [address]  SARS Coronavirus 2 (CEPHEID - Performed in [facility] hospital lab), Hosp  Order     Status: None   Collection Time: 01/19/19  2:47 PM   Specimen: Nasopharyngeal Swab  Result Value Ref Range   SARS Coronavirus 2 NEGATIVE NEGATIVE    Comment: (NOTE) If result is NEGATIVE SARS-CoV-2 target nucleic acids are NOT DETECTED. The SARS-CoV-2 RNA is generally detectable in upper and lower  respiratory specimens during the acute phase of infection. The lowest  concentration of SARS-CoV-2 viral copies this assay can detect is 250  copies / mL. A negative result does not preclude SARS-CoV-2 infection  and should not be used as the sole basis for treatment or other  patient management decisions.  A negative result may occur with  improper specimen collection / handling, submission of specimen other  than nasopharyngeal swab, presence of viral mutation(s) within the  areas targeted by this assay, and inadequate number of viral copies  (<250 copies / mL). A negative result must be combined with clinical  observations, patient history, and epidemiological information. If result is POSITIVE SARS-CoV-2 target nucleic acids are DETECTED. The SARS-CoV-2 RNA is generally detectable in upper and lower  respiratory specimens dur ing the acute phase of infection.  Positive  results are indicative of active infection with SARS-CoV-2.  Clinical  correlation with patient history and other diagnostic information is  necessary to determine patient infection status.  Positive results do  not rule out bacterial infection or co-infection with other viruses. If result is PRESUMPTIVE POSTIVE SARS-CoV-2 nucleic acids MAY BE PRESENT.   A presumptive positive result was obtained on the submitted specimen  and confirmed on repeat testing.  While 2019 novel coronavirus  (SARS-CoV-2) nucleic acids may be present in the submitted sample  additional confirmatory testing may be necessary for epidemiological  and / or clinical management purposes  to differentiate between  SARS-CoV-2 and other  Sarbecovirus currently known to infect humans.  If clinically indicated additional testing with an alternate test  methodology [phone]) is advised. The SARS-CoV-2 RNA is generally  detectable in upper and lower respiratory sp ecimens during the acute  phase of infection. The expected result is  Negative. Fact Sheet for Patients:  StrictlyIdeas.no Fact Sheet for Healthcare Providers: BankingDealers.co.za This test is not yet approved or cleared by the [place] FDA and has been authorized for detection and/or diagnosis of SARS-CoV-2 by FDA under an Emergency Use Authorization (EUA).  This EUA will remain in effect (meaning this test can be used) for the duration of the COVID-19 declaration under Section 564(b)(1) of the Act, 21 U.S.C. section 360bbb-3(b)(1), unless the authorization is terminated or revoked sooner. Performed at [facility], [place] [address] [place], [place] [address]   CBC with Differential/Platelet     Status: Abnormal   Collection Time: 01/20/19  2:23 AM  Result Value Ref Range   WBC 4.1 4.0 - 10.5 K/uL   RBC 3.88 3.87 - 5.11 MIL/uL   Hemoglobin 11.2 (L) 12.0 - 15.0 g/dL   HCT 35.6 (L) 36.0 - 46.0 %   MCV 91.8 80.0 - 100.0 fL   MCH 28.9 26.0 - 34.0 pg   MCHC 31.5 30.0 - 36.0 g/dL   RDW 17.2 (H) 11.5 - 15.5 %   Platelets 420 (H) 150 - 400 K/uL   nRBC 0.0 0.0 - 0.2 %   Neutrophils Relative % 66 %   Neutro Abs 2.7 1.7 - 7.7 K/uL   Lymphocytes Relative 15 %   Lymphs Abs 0.6 (L) 0.7 - 4.0 K/uL   Monocytes Relative 8 %   Monocytes Absolute 0.3 0.1 - 1.0 K/uL   Eosinophils Relative 9 %   Eosinophils Absolute 0.4 0.0 - 0.5 K/uL   Basophils Relative 1 %   Basophils Absolute 0.0 0.0 - 0.1 K/uL   nRBC 1 (H) 0 /100 WBC   Myelocytes 1 %   Abs Immature Granulocytes 0.00 0.00 - 0.07 K/uL    Comment: Performed at [facility], [place] [address] [place], [place] [address]  Comprehensive metabolic panel      Status: Abnormal   Collection Time: 01/20/19  2:23 AM  Result Value Ref Range   Sodium 143 135 - 145 mmol/L   Potassium 3.6 3.5 - 5.1 mmol/L   Chloride 110 98 - 111 mmol/L   CO2 25 22 - 32 mmol/L   Glucose, Bld 137 (H) 70 - 99 mg/dL   BUN 8 8 - 23 mg/dL   Creatinine, Ser 0.97 0.44 - 1.00 mg/dL   Calcium 10.0 8.9 - 10.3 mg/dL   Total Protein 6.8 6.5 - 8.1 g/dL   Albumin 3.6 3.5 - 5.0 g/dL   AST 12 (L) 15 - 41 U/L   ALT 12 0 - 44 U/L   Alkaline Phosphatase 78 38 - 126 U/L   Total Bilirubin 1.2 0.3 - 1.2 mg/dL   GFR calc non Af Amer 60 (L) >60 mL/min   GFR calc Af Amer >60 >60 mL/min   Anion gap 8 5 - 15    Comment: Performed at [facility] [address], [place], [place] [address]  Magnesium     Status: None   Collection Time: 01/20/19  2:23 AM  Result Value Ref Range   Magnesium 2.4 1.7 - 2.4 mg/dL    Comment: Performed at [facility] [address], [place], [place] [address]  Phosphorus     Status: None   Collection Time: 01/20/19  2:23 AM  Result Value Ref Range   Phosphorus 3.5 2.5 - 4.6 mg/dL    Comment: Performed at [facility] [address], [place], [place] [address]  Radiology/Results: Ct Abdomen Pelvis W Contrast  Result Date: 01/19/2019 CLINICAL DATA:  Abdominal pain with swelling for 3 days. EXAM: CT ABDOMEN AND PELVIS WITH CONTRAST TECHNIQUE: Multidetector CT imaging of the abdomen and pelvis was performed using the standard protocol following bolus administration of intravenous contrast. CONTRAST:  141mL ISOVUE-300 IOPAMIDOL (ISOVUE-300) INJECTION 61% COMPARISON:  CT, 06/20/2006 FINDINGS: Lower chest: No acute abnormality. Hepatobiliary: 2 cm partial fat density lesion at the dome the left liver with associated calcification. Additional low-density lesions seen at the dome of the right lobe, next largest 1.4 cm. No other liver masses or lesions. Normal gallbladder. No bile duct dilation. Pancreas: Unremarkable. No pancreatic ductal  dilatation or surrounding inflammatory changes. Spleen: Normal in size without focal abnormality. Adrenals/Urinary Tract: No adrenal masses. Kidneys normal in size, orientation and position with symmetric enhancement and excretion. Small low-density renal lesions are noted too small to fully characterize, but likely cysts. 1 mm nonobstructing stone in the lower pole of the left kidney. No hydronephrosis. Ureters normal in course and in caliber. Bladder is unremarkable. Stomach/Bowel: Stomach is unremarkable. There is dilated small bowel, maximum dilation 4.2 cm, with 2 possible transition points in the pelvis, followed by distal decompressed small bowel. The colon is mostly decompressed. There is no bowel wall thickening. No inflammation Vascular/Lymphatic: Aortic atherosclerosis. No aneurysm. No enlarged lymph nodes. Reproductive: Status post hysterectomy. No adnexal masses. Other: Trace ascites noted adjacent to the liver. Musculoskeletal: No fracture or acute finding. No osteoblastic or osteolytic lesions. IMPRESSION: 1. Partial small bowel obstruction, apparent transition point in the pelvis, likely from adhesions. 2. No other acute abnormality within the abdomen or pelvis. 3. Low-density lesions at the dome of the liver which are new since the prior CT, but appear benign. 4. Small low-density renal lesions, too small to fully characterize but likely cysts. Tiny nonobstructing left intrarenal stone. 5. Aortic atherosclerosis. Electronically Signed   By: Lajean Manes M.D.   On: 01/19/2019 13:54   Dg Abd Portable 1v-small Bowel Obstruction Protocol-initial, 8 Hr Delay  Result Date: 01/20/2019 CLINICAL DATA:  68 year old female with small bowel obstruction. EXAM: PORTABLE ABDOMEN - 1 VIEW COMPARISON:  Earlier radiograph dated 01/19/2019 FINDINGS: Partially visualized enteric tube with tip in the distal stomach. Interval improvement of the dilatation of the small bowel compared to the prior radiograph. A mildly  dilated loop of bowel is noted in the left upper abdomen measuring up to 3.6 cm. The osseous structures and soft tissues are grossly unremarkable. IMPRESSION: Interval improvement of the small-bowel obstruction. Electronically Signed   By: Anner Crete M.D.   On: 01/20/2019 02:15   Dg Abd Portable 1v-small Bowel Protocol-position Verification  Result Date: 01/19/2019 CLINICAL DATA:  Check gastric catheter placement EXAM: PORTABLE ABDOMEN - 1 VIEW COMPARISON:  Film from earlier in the same day. FINDINGS: Gastric catheter is been further advanced into the stomach. Multiple dilated loops of small bowel are noted although slightly improved when compared with the prior exam. IMPRESSION: Nasogastric catheter within the stomach. Slight improvement in the degree of small bowel dilatation when compare with the prior study. Electronically Signed   By: Inez Catalina M.D.   On: 01/19/2019 17:22   Dg Abd Portable 1 View  Result Date: 01/19/2019 CLINICAL DATA:  NG tube placement. EXAM: PORTABLE ABDOMEN - 1 VIEW COMPARISON:  None. FINDINGS: NG tube is identified with tip overlying the proximal stomach. The side hole size overlies the EG junction. IMPRESSION: NG tube with tip overlying the proximal stomach and side  hole overlying the EG junction. Recommend advancement. Electronically Signed   By: Margarette Canada M.D.   On: 01/19/2019 15:17    Anti-infectives: Anti-infectives (From admission, onward)   None      Assessment/Plan: Problem List: Patient Active Problem List   Diagnosis Date Noted  . SBO (small bowel obstruction) ([place]) 01/19/2019  . Hx of adenomatous colonic polyps 05/14/2015  . Prolapsed internal hemorrhoids, grade 3 05/09/2015    Continue observation with NG in place;  Hx of hysterectomy so possible adhesive pSBO * No surgery found *    LOS: 1 day   Matt B. Hassell Done, MD, [facility] [phone] beeper [phone]  01/20/2019 10:44 AM

## 2019-01-21 DIAGNOSIS — E860 Dehydration: Secondary | ICD-10-CM

## 2019-01-21 DIAGNOSIS — H5713 Ocular pain, bilateral: Secondary | ICD-10-CM

## 2019-01-21 LAB — BASIC METABOLIC PANEL WITH GFR
Anion gap: 8 (ref 5–15)
BUN: 6 mg/dL — ABNORMAL LOW (ref 8–23)
CO2: 25 mmol/L (ref 22–32)
Calcium: 9.8 mg/dL (ref 8.9–10.3)
Chloride: 115 mmol/L — ABNORMAL HIGH (ref 98–111)
Creatinine, Ser: 0.84 mg/dL (ref 0.44–1.00)
GFR calc Af Amer: >60 mL/min (ref >60)
GFR calc non Af Amer: >60 mL/min (ref >60)
Glucose, Bld: 116 mg/dL — ABNORMAL HIGH (ref 70–99)
Potassium: 3.7 mmol/L (ref 3.5–5.1)
Sodium: 148 mmol/L — ABNORMAL HIGH (ref 135–145)

## 2019-01-21 MED ORDER — SODIUM CHLORIDE 0.9 % IV BOLUS
500.0000 mL | Freq: Once | INTRAVENOUS | Status: AC
  Administered 2019-01-21: 500 mL via INTRAVENOUS

## 2019-01-21 MED ORDER — NAPHAZOLINE-GLYCERIN 0.012-0.2 % OP SOLN
1.0000 [drp] | Freq: Four times a day (QID) | OPHTHALMIC | Status: DC | PRN
  Administered 2019-01-21: 2 [drp] via OPHTHALMIC
  Filled 2019-01-21: qty 15

## 2019-01-21 NOTE — Progress Notes (Signed)
PROGRESS NOTE    [name]   RDE:[id number]  DOB: 01/17/1951  DOA: 01/19/2019 PCP: Gregor Hams, FNP   Brief Narrative:  [name] is a 68 y.o. female with medical history significant for gynecological abdominal surgeries x2, essential hypertension, chronic depression/anxiety/bipolar disorder, who presented to [facility] ED with complaints of severe abdominal pain associated with nausea and vomiting x4 days.  CT abdomen and pelvis with contrast revealed partial small bowel obstruction.    Subjective: She continues to feel her abdomen is distended and has not been passing gas or had a BM. She also feel her eyes are burning today.     Assessment & Plan:   Principal Problem:   SBO (small bowel obstruction)  - cont NG tube per general surgery -cont IVF  Active Problems: Dehydration - she has had poor urine output- will give a NS bolus today and increase IVF today  Burning eyes - possibly dry eyes- drops ordered    HTN (hypertension) - she takes Amlodipine at at home- currently BP is normal- Labetalol ordered PRN    HLD (hyperlipidemia) - hold Lipitor  Anxiety/depression - holding Cymbalta and Lithium due to SBO   Time spent in minutes: 35 DVT prophylaxis: Lovenox Code Status: Full code Family Communication:  Disposition Plan: home when stable Consultants:   General surgery Procedures:   NG tube Antimicrobials:  Anti-infectives (From admission, onward)   None       Objective: Vitals:   01/20/19 0502 01/20/19 1446 01/20/19 2119 01/21/19 0547  BP: 122/83 123/84 130/85 129/80  Pulse: 99 (!) 103 100 93  Resp:  16 16   Temp: 97.8 F (36.6 C) 97.6 F (36.4 C) 98.3 F (36.8 C) 98.4 F (36.9 C)  TempSrc: Oral Axillary Oral Oral  SpO2: 98% 98% 98% 97%  Weight:      Height:        Intake/Output Summary (Last 24 hours) at 01/21/2019 1001 Last data filed at 01/21/2019 0800 Gross per 24 hour  Intake 0 ml  Output 2550 ml  Net -2550 ml    Filed Weights   01/19/19 1042 01/19/19 1725  Weight: 83.9 kg 82.7 kg    Examination: General exam: Appears comfortable  HEENT: PERRLA, oral mucosa moist, no sclera icterus or thrush Respiratory system: Clear to auscultation. Respiratory effort normal. Cardiovascular system: S1 & S2 heard,  No murmurs  Gastrointestinal system: Abdomen soft, non-tender,  Moderately distended. Poor bowel sounds   Central nervous system: Alert and oriented. No focal neurological deficits. Extremities: No cyanosis, clubbing or edema Skin: No rashes or ulcers Psychiatry:  Mood & affect appropriate.     Data Reviewed: I have personally reviewed following labs and imaging studies  CBC: Recent Labs  Lab 01/19/19 1046 01/20/19 0223  WBC 6.1 4.1  NEUTROABS  --  2.7  HGB 12.2 11.2*  HCT 38.0 35.6*  MCV 90.5 91.8  PLT 458* 631*   Basic Metabolic Panel: Recent Labs  Lab 01/19/19 1046 01/20/19 0223  NA 137 143  K 3.7 3.6  CL 107 110  CO2 21* 25  GLUCOSE 125* 137*  BUN 9 8  CREATININE 1.06* 0.97  CALCIUM 10.3 10.0  MG  --  2.4  PHOS  --  3.5   GFR: Estimated Creatinine Clearance: 54.5 mL/min (by C-G formula based on SCr of 0.97 mg/dL). Liver Function Tests: Recent Labs  Lab 01/19/19 1046 01/20/19 0223  AST 13* 12*  ALT 12 12  ALKPHOS 87 78  BILITOT  1.7* 1.2  PROT 6.8 6.8  ALBUMIN 3.9 3.6   Recent Labs  Lab 01/19/19 1046  LIPASE 31   No results for input(s): AMMONIA in the last 168 hours. Coagulation Profile: No results for input(s): INR, PROTIME in the last 168 hours. Cardiac Enzymes: No results for input(s): CKTOTAL, CKMB, CKMBINDEX, TROPONINI in the last 168 hours. BNP (last 3 results) No results for input(s): PROBNP in the last 8760 hours. HbA1C: Recent Labs    01/19/19 1046  HGBA1C 6.3*   CBG: No results for input(s): GLUCAP in the last 168 hours. Lipid Profile: No results for input(s): CHOL, HDL, LDLCALC, TRIG, CHOLHDL, LDLDIRECT in the last 72  hours. Thyroid Function Tests: No results for input(s): TSH, T4TOTAL, FREET4, T3FREE, THYROIDAB in the last 72 hours. Anemia Panel: No results for input(s): VITAMINB12, FOLATE, FERRITIN, TIBC, IRON, RETICCTPCT in the last 72 hours. Urine analysis:    Component Value Date/Time   COLORURINE YELLOW 01/19/2019 1046   APPEARANCEUR CLEAR 01/19/2019 1046   LABSPEC 1.012 01/19/2019 1046   PHURINE 6.0 01/19/2019 1046   GLUCOSEU NEGATIVE 01/19/2019 1046   HGBUR NEGATIVE 01/19/2019 [place] 01/19/2019 1046   [place] 01/19/2019 1046   PROTEINUR 100 (A) 01/19/2019 1046   UROBILINOGEN 0.2 03/18/2011 2118   NITRITE NEGATIVE 01/19/2019 1046   LEUKOCYTESUR NEGATIVE 01/19/2019 1046   Sepsis Labs: @LABRCNTIP (procalcitonin:4,lacticidven:4) ) Recent Results (from the past 240 hour(s))  SARS Coronavirus 2 (CEPHEID - Performed in [facility] lab), Hosp Order     Status: None   Collection Time: 01/19/19  2:47 PM   Specimen: Nasopharyngeal Swab  Result Value Ref Range Status   SARS Coronavirus 2 NEGATIVE NEGATIVE Final    Comment: (NOTE) If result is NEGATIVE SARS-CoV-2 target nucleic acids are NOT DETECTED. The SARS-CoV-2 RNA is generally detectable in upper and lower  respiratory specimens during the acute phase of infection. The lowest  concentration of SARS-CoV-2 viral copies this assay can detect is 250  copies / mL. A negative result does not preclude SARS-CoV-2 infection  and should not be used as the sole basis for treatment or other  patient management decisions.  A negative result may occur with  improper specimen collection / handling, submission of specimen other  than nasopharyngeal swab, presence of viral mutation(s) within the  areas targeted by this assay, and inadequate number of viral copies  (<250 copies / mL). A negative result must be combined with clinical  observations, patient history, and epidemiological information. If result is  POSITIVE SARS-CoV-2 target nucleic acids are DETECTED. The SARS-CoV-2 RNA is generally detectable in upper and lower  respiratory specimens dur ing the acute phase of infection.  Positive  results are indicative of active infection with SARS-CoV-2.  Clinical  correlation with patient history and other diagnostic information is  necessary to determine patient infection status.  Positive results do  not rule out bacterial infection or co-infection with other viruses. If result is PRESUMPTIVE POSTIVE SARS-CoV-2 nucleic acids MAY BE PRESENT.   A presumptive positive result was obtained on the submitted specimen  and confirmed on repeat testing.  While 2019 novel coronavirus  (SARS-CoV-2) nucleic acids may be present in the submitted sample  additional confirmatory testing may be necessary for epidemiological  and / or clinical management purposes  to differentiate between  SARS-CoV-2 and other Sarbecovirus currently known to infect humans.  If clinically indicated additional testing with an alternate test  methodology [phone]) is advised. The SARS-CoV-2 RNA is  generally  detectable in upper and lower respiratory sp ecimens during the acute  phase of infection. The expected result is Negative. Fact Sheet for Patients:  StrictlyIdeas.no Fact Sheet for Healthcare Providers: BankingDealers.co.za This test is not yet approved or cleared by the [place] FDA and has been authorized for detection and/or diagnosis of SARS-CoV-2 by FDA under an Emergency Use Authorization (EUA).  This EUA will remain in effect (meaning this test can be used) for the duration of the COVID-19 declaration under Section 564(b)(1) of the Act, 21 U.S.C. section 360bbb-3(b)(1), unless the authorization is terminated or revoked sooner. Performed at [facility], [place] [address] [place], [place] [address]          Radiology Studies: Ct Abdomen Pelvis W  Contrast  Result Date: 01/19/2019 CLINICAL DATA:  Abdominal pain with swelling for 3 days. EXAM: CT ABDOMEN AND PELVIS WITH CONTRAST TECHNIQUE: Multidetector CT imaging of the abdomen and pelvis was performed using the standard protocol following bolus administration of intravenous contrast. CONTRAST:  127mL ISOVUE-300 IOPAMIDOL (ISOVUE-300) INJECTION 61% COMPARISON:  CT, 06/20/2006 FINDINGS: Lower chest: No acute abnormality. Hepatobiliary: 2 cm partial fat density lesion at the dome the left liver with associated calcification. Additional low-density lesions seen at the dome of the right lobe, next largest 1.4 cm. No other liver masses or lesions. Normal gallbladder. No bile duct dilation. Pancreas: Unremarkable. No pancreatic ductal dilatation or surrounding inflammatory changes. Spleen: Normal in size without focal abnormality. Adrenals/Urinary Tract: No adrenal masses. Kidneys normal in size, orientation and position with symmetric enhancement and excretion. Small low-density renal lesions are noted too small to fully characterize, but likely cysts. 1 mm nonobstructing stone in the lower pole of the left kidney. No hydronephrosis. Ureters normal in course and in caliber. Bladder is unremarkable. Stomach/Bowel: Stomach is unremarkable. There is dilated small bowel, maximum dilation 4.2 cm, with 2 possible transition points in the pelvis, followed by distal decompressed small bowel. The colon is mostly decompressed. There is no bowel wall thickening. No inflammation Vascular/Lymphatic: Aortic atherosclerosis. No aneurysm. No enlarged lymph nodes. Reproductive: Status post hysterectomy. No adnexal masses. Other: Trace ascites noted adjacent to the liver. Musculoskeletal: No fracture or acute finding. No osteoblastic or osteolytic lesions. IMPRESSION: 1. Partial small bowel obstruction, apparent transition point in the pelvis, likely from adhesions. 2. No other acute abnormality within the abdomen or pelvis. 3.  Low-density lesions at the dome of the liver which are new since the prior CT, but appear benign. 4. Small low-density renal lesions, too small to fully characterize but likely cysts. Tiny nonobstructing left intrarenal stone. 5. Aortic atherosclerosis. Electronically Signed   By: Lajean Manes M.D.   On: 01/19/2019 13:54   Dg Abd Portable 1v-small Bowel Obstruction Protocol-24 Hr Delay  Result Date: 01/20/2019 CLINICAL DATA:  68 year old female with follow-up small-bowel obstruction. EXAM: PORTABLE ABDOMEN - 1 VIEW COMPARISON:  Multiple prior radiographs dating back to CT of 01/19/2019. FINDINGS: Enteric tube with tip in the distal stomach. Small amount of oral contrast noted in the stomach. Interval improvement of the previously seen dilated small bowel loops. There is however a persistent dilatation of a loop of small bowel in the mid abdomen measuring up to 4 cm in diameter. A small focus of calcification is noted in the right upper quadrant. The osseous structures and soft tissues are unremarkable. IMPRESSION: Persistent dilatation of a loop of small bowel in the mid abdomen measuring up to 4 cm. Overall improved appearance of the bowel compared  to the radiograph of 01/19/2019. Electronically Signed   By: Anner Crete M.D.   On: 01/20/2019 19:46   Dg Abd Portable 1v-small Bowel Obstruction Protocol-initial, 8 Hr Delay  Result Date: 01/20/2019 CLINICAL DATA:  68 year old female with small bowel obstruction. EXAM: PORTABLE ABDOMEN - 1 VIEW COMPARISON:  Earlier radiograph dated 01/19/2019 FINDINGS: Partially visualized enteric tube with tip in the distal stomach. Interval improvement of the dilatation of the small bowel compared to the prior radiograph. A mildly dilated loop of bowel is noted in the left upper abdomen measuring up to 3.6 cm. The osseous structures and soft tissues are grossly unremarkable. IMPRESSION: Interval improvement of the small-bowel obstruction. Electronically Signed   By:  Anner Crete M.D.   On: 01/20/2019 02:15   Dg Abd Portable 1v-small Bowel Protocol-position Verification  Result Date: 01/19/2019 CLINICAL DATA:  Check gastric catheter placement EXAM: PORTABLE ABDOMEN - 1 VIEW COMPARISON:  Film from earlier in the same day. FINDINGS: Gastric catheter is been further advanced into the stomach. Multiple dilated loops of small bowel are noted although slightly improved when compared with the prior exam. IMPRESSION: Nasogastric catheter within the stomach. Slight improvement in the degree of small bowel dilatation when compare with the prior study. Electronically Signed   By: Inez Catalina M.D.   On: 01/19/2019 17:22   Dg Abd Portable 1 View  Result Date: 01/19/2019 CLINICAL DATA:  NG tube placement. EXAM: PORTABLE ABDOMEN - 1 VIEW COMPARISON:  None. FINDINGS: NG tube is identified with tip overlying the proximal stomach. The side hole size overlies the EG junction. IMPRESSION: NG tube with tip overlying the proximal stomach and side hole overlying the EG junction. Recommend advancement. Electronically Signed   By: Margarette Canada M.D.   On: 01/19/2019 15:17      Scheduled Meds:  enoxaparin (LOVENOX) injection  40 mg Subcutaneous Q24H   Continuous Infusions:  dextrose 5% lactated ringers 100 mL/hr at 01/21/19 0500     LOS: 2 days      Debbe Odea, MD [facility] Pager: [email or web address] Password [email or web address] 01/21/2019, 10:01 AM

## 2019-01-21 NOTE — Progress Notes (Signed)
Patient ID: [name], female   DOB: [birth year], 68 y.o.   MRN: [id number] [facility] Progress Note:   * No surgery found *  Subjective: Mental status is alert and clear Objective: Vital signs in last 24 hours: Temp:  [97.6 F (36.4 C)-98.4 F (36.9 C)] 98.4 F (36.9 C) (07/26 0547) Pulse Rate:  [93-103] 93 (07/26 0547) Resp:  [16] 16 (07/25 2119) BP: (123-130)/(80-85) 129/80 (07/26 0547) SpO2:  [97 %-98 %] 97 % (07/26 0547)  Intake/Output from previous day: 07/25 0701 - 07/26 0700 In: 60 [P.O.:60] Out: 2000 [Emesis/NG output:2000] Intake/Output this shift: Total I/O In: -  Out: 550 [Urine:550]  Physical Exam: Work of breathing is normal;  NG in place with clear/green discharge.  Abdomen is mildly bloated; nontender; no flatus or BM.  Lab Results:  Results for orders placed or performed during the hospital encounter of 01/19/19 (from the past 48 hour(s))  Lipase, blood     Status: None   Collection Time: 01/19/19 10:46 AM  Result Value Ref Range   Lipase 31 11 - 51 U/L    Comment: Performed at [facility], [place] [address] [place], [place] [address]  Comprehensive metabolic panel     Status: Abnormal   Collection Time: 01/19/19 10:46 AM  Result Value Ref Range   Sodium 137 135 - 145 mmol/L   Potassium 3.7 3.5 - 5.1 mmol/L   Chloride 107 98 - 111 mmol/L   CO2 21 (L) 22 - 32 mmol/L   Glucose, Bld 125 (H) 70 - 99 mg/dL   BUN 9 8 - 23 mg/dL   Creatinine, Ser 1.06 (H) 0.44 - 1.00 mg/dL   Calcium 10.3 8.9 - 10.3 mg/dL   Total Protein 6.8 6.5 - 8.1 g/dL   Albumin 3.9 3.5 - 5.0 g/dL   AST 13 (L) 15 - 41 U/L   ALT 12 0 - 44 U/L   Alkaline Phosphatase 87 38 - 126 U/L   Total Bilirubin 1.7 (H) 0.3 - 1.2 mg/dL   GFR calc non Af Amer 54 (L) >60 mL/min   GFR calc Af Amer >60 >60 mL/min   Anion gap 9 5 - 15    Comment: Performed at [facility], [place] [address] [place], [place] [address]  CBC     Status: Abnormal   Collection Time: 01/19/19  10:46 AM  Result Value Ref Range   WBC 6.1 4.0 - 10.5 K/uL   RBC 4.20 3.87 - 5.11 MIL/uL   Hemoglobin 12.2 12.0 - 15.0 g/dL   HCT 38.0 36.0 - 46.0 %   MCV 90.5 80.0 - 100.0 fL   MCH 29.0 26.0 - 34.0 pg   MCHC 32.1 30.0 - 36.0 g/dL   RDW 16.8 (H) 11.5 - 15.5 %   Platelets 458 (H) 150 - 400 K/uL   nRBC 0.0 0.0 - 0.2 %    Comment: Performed at [facility], [place] [address], [place], [place] [address]  Urinalysis, Routine w reflex microscopic     Status: Abnormal   Collection Time: 01/19/19 10:46 AM  Result Value Ref Range   Color, Urine YELLOW YELLOW   APPearance CLEAR CLEAR   Specific Gravity, Urine 1.012 1.005 - 1.030   pH 6.0 5.0 - 8.0   Glucose, UA NEGATIVE NEGATIVE mg/dL   Hgb urine dipstick NEGATIVE NEGATIVE   Bilirubin Urine NEGATIVE NEGATIVE   Ketones, ur NEGATIVE NEGATIVE mg/dL   Protein, ur 100 (A) NEGATIVE mg/dL  Nitrite NEGATIVE NEGATIVE   Leukocytes,Ua NEGATIVE NEGATIVE   RBC / HPF 0-5 0 - 5 RBC/hpf   WBC, UA 0-5 0 - 5 WBC/hpf   Bacteria, UA RARE (A) NONE SEEN   Squamous Epithelial / LPF 6-10 0 - 5    Comment: Performed at [facility], [place] [address] [place], [place] [address]  Hemoglobin A1c     Status: Abnormal   Collection Time: 01/19/19 10:46 AM  Result Value Ref Range   Hgb A1c MFr Bld 6.3 (H) 4.8 - 5.6 %    Comment: REPEATED TO VERIFY (NOTE) Pre diabetes:          5.7%-6.4% Diabetes:              >6.4% Glycemic control for   <7.0% adults with diabetes    Mean Plasma Glucose 134.11 mg/dL    Comment: Performed at [facility] [address] [place], [place] [address]  Lithium level     Status: Abnormal   Collection Time: 01/19/19 10:54 AM  Result Value Ref Range   Lithium Lvl 0.33 (L) 0.60 - 1.20 mmol/L    Comment: Performed at [facility] [address] [place], [place] [address]  SARS Coronavirus 2 (CEPHEID - Performed in [facility] hospital lab), Hosp Order     Status: None   Collection Time: 01/19/19  2:47 PM    Specimen: Nasopharyngeal Swab  Result Value Ref Range   SARS Coronavirus 2 NEGATIVE NEGATIVE    Comment: (NOTE) If result is NEGATIVE SARS-CoV-2 target nucleic acids are NOT DETECTED. The SARS-CoV-2 RNA is generally detectable in upper and lower  respiratory specimens during the acute phase of infection. The lowest  concentration of SARS-CoV-2 viral copies this assay can detect is 250  copies / mL. A negative result does not preclude SARS-CoV-2 infection  and should not be used as the sole basis for treatment or other  patient management decisions.  A negative result may occur with  improper specimen collection / handling, submission of specimen other  than nasopharyngeal swab, presence of viral mutation(s) within the  areas targeted by this assay, and inadequate number of viral copies  (<250 copies / mL). A negative result must be combined with clinical  observations, patient history, and epidemiological information. If result is POSITIVE SARS-CoV-2 target nucleic acids are DETECTED. The SARS-CoV-2 RNA is generally detectable in upper and lower  respiratory specimens dur ing the acute phase of infection.  Positive  results are indicative of active infection with SARS-CoV-2.  Clinical  correlation with patient history and other diagnostic information is  necessary to determine patient infection status.  Positive results do  not rule out bacterial infection or co-infection with other viruses. If result is PRESUMPTIVE POSTIVE SARS-CoV-2 nucleic acids MAY BE PRESENT.   A presumptive positive result was obtained on the submitted specimen  and confirmed on repeat testing.  While 2019 novel coronavirus  (SARS-CoV-2) nucleic acids may be present in the submitted sample  additional confirmatory testing may be necessary for epidemiological  and / or clinical management purposes  to differentiate between  SARS-CoV-2 and other Sarbecovirus currently known to infect humans.  If clinically  indicated additional testing with an alternate test  methodology [phone]) is advised. The SARS-CoV-2 RNA is generally  detectable in upper and lower respiratory sp ecimens during the acute  phase of infection. The expected result is Negative. Fact Sheet for Patients:  StrictlyIdeas.no Fact Sheet for Healthcare Providers: BankingDealers.co.za  This test is not yet approved or cleared by the [place] and has been authorized for detection and/or diagnosis of SARS-CoV-2 by FDA under an Emergency Use Authorization (EUA).  This EUA will remain in effect (meaning this test can be used) for the duration of the COVID-19 declaration under Section 564(b)(1) of the Act, 21 U.S.C. section 360bbb-3(b)(1), unless the authorization is terminated or revoked sooner. Performed at [facility], [place] [address], [place], [place] [address]   CBC with Differential/Platelet     Status: Abnormal   Collection Time: 01/20/19  2:23 AM  Result Value Ref Range   WBC 4.1 4.0 - 10.5 K/uL   RBC 3.88 3.87 - 5.11 MIL/uL   Hemoglobin 11.2 (L) 12.0 - 15.0 g/dL   HCT 35.6 (L) 36.0 - 46.0 %   MCV 91.8 80.0 - 100.0 fL   MCH 28.9 26.0 - 34.0 pg   MCHC 31.5 30.0 - 36.0 g/dL   RDW 17.2 (H) 11.5 - 15.5 %   Platelets 420 (H) 150 - 400 K/uL   nRBC 0.0 0.0 - 0.2 %   Neutrophils Relative % 66 %   Neutro Abs 2.7 1.7 - 7.7 K/uL   Lymphocytes Relative 15 %   Lymphs Abs 0.6 (L) 0.7 - 4.0 K/uL   Monocytes Relative 8 %   Monocytes Absolute 0.3 0.1 - 1.0 K/uL   Eosinophils Relative 9 %   Eosinophils Absolute 0.4 0.0 - 0.5 K/uL   Basophils Relative 1 %   Basophils Absolute 0.0 0.0 - 0.1 K/uL   nRBC 1 (H) 0 /100 WBC   Myelocytes 1 %   Abs Immature Granulocytes 0.00 0.00 - 0.07 K/uL    Comment: Performed at [facility], [place] [address], [place], [place] [address]  Comprehensive metabolic panel     Status: Abnormal   Collection Time: 01/20/19  2:23 AM  Result  Value Ref Range   Sodium 143 135 - 145 mmol/L   Potassium 3.6 3.5 - 5.1 mmol/L   Chloride 110 98 - 111 mmol/L   CO2 25 22 - 32 mmol/L   Glucose, Bld 137 (H) 70 - 99 mg/dL   BUN 8 8 - 23 mg/dL   Creatinine, Ser 0.97 0.44 - 1.00 mg/dL   Calcium 10.0 8.9 - 10.3 mg/dL   Total Protein 6.8 6.5 - 8.1 g/dL   Albumin 3.6 3.5 - 5.0 g/dL   AST 12 (L) 15 - 41 U/L   ALT 12 0 - 44 U/L   Alkaline Phosphatase 78 38 - 126 U/L   Total Bilirubin 1.2 0.3 - 1.2 mg/dL   GFR calc non Af Amer 60 (L) >60 mL/min   GFR calc Af Amer >60 >60 mL/min   Anion gap 8 5 - 15    Comment: Performed at [facility] [address], [place], [place] [address]  Magnesium     Status: None   Collection Time: 01/20/19  2:23 AM  Result Value Ref Range   Magnesium 2.4 1.7 - 2.4 mg/dL    Comment: Performed at [facility] [address], [place], [place] [address]  Phosphorus     Status: None   Collection Time: 01/20/19  2:23 AM  Result Value Ref Range   Phosphorus 3.5 2.5 - 4.6 mg/dL    Comment: Performed at [facility] [address], [place], [place] [address]    Radiology/Results: Ct Abdomen Pelvis W Contrast  Result Date: 01/19/2019  CLINICAL DATA:  Abdominal pain with swelling for 3 days. EXAM: CT ABDOMEN AND PELVIS WITH CONTRAST TECHNIQUE: Multidetector CT imaging of the abdomen and pelvis was performed using the standard protocol following bolus administration of intravenous contrast. CONTRAST:  122mL ISOVUE-300 IOPAMIDOL (ISOVUE-300) INJECTION 61% COMPARISON:  CT, 06/20/2006 FINDINGS: Lower chest: No acute abnormality. Hepatobiliary: 2 cm partial fat density lesion at the dome the left liver with associated calcification. Additional low-density lesions seen at the dome of the right lobe, next largest 1.4 cm. No other liver masses or lesions. Normal gallbladder. No bile duct dilation. Pancreas: Unremarkable. No pancreatic ductal dilatation or surrounding inflammatory changes. Spleen: Normal in size  without focal abnormality. Adrenals/Urinary Tract: No adrenal masses. Kidneys normal in size, orientation and position with symmetric enhancement and excretion. Small low-density renal lesions are noted too small to fully characterize, but likely cysts. 1 mm nonobstructing stone in the lower pole of the left kidney. No hydronephrosis. Ureters normal in course and in caliber. Bladder is unremarkable. Stomach/Bowel: Stomach is unremarkable. There is dilated small bowel, maximum dilation 4.2 cm, with 2 possible transition points in the pelvis, followed by distal decompressed small bowel. The colon is mostly decompressed. There is no bowel wall thickening. No inflammation Vascular/Lymphatic: Aortic atherosclerosis. No aneurysm. No enlarged lymph nodes. Reproductive: Status post hysterectomy. No adnexal masses. Other: Trace ascites noted adjacent to the liver. Musculoskeletal: No fracture or acute finding. No osteoblastic or osteolytic lesions. IMPRESSION: 1. Partial small bowel obstruction, apparent transition point in the pelvis, likely from adhesions. 2. No other acute abnormality within the abdomen or pelvis. 3. Low-density lesions at the dome of the liver which are new since the prior CT, but appear benign. 4. Small low-density renal lesions, too small to fully characterize but likely cysts. Tiny nonobstructing left intrarenal stone. 5. Aortic atherosclerosis. Electronically Signed   By: Lajean Manes M.D.   On: 01/19/2019 13:54   Dg Abd Portable 1v-small Bowel Obstruction Protocol-24 Hr Delay  Result Date: 01/20/2019 CLINICAL DATA:  68 year old female with follow-up small-bowel obstruction. EXAM: PORTABLE ABDOMEN - 1 VIEW COMPARISON:  Multiple prior radiographs dating back to CT of 01/19/2019. FINDINGS: Enteric tube with tip in the distal stomach. Small amount of oral contrast noted in the stomach. Interval improvement of the previously seen dilated small bowel loops. There is however a persistent dilatation of  a loop of small bowel in the mid abdomen measuring up to 4 cm in diameter. A small focus of calcification is noted in the right upper quadrant. The osseous structures and soft tissues are unremarkable. IMPRESSION: Persistent dilatation of a loop of small bowel in the mid abdomen measuring up to 4 cm. Overall improved appearance of the bowel compared to the radiograph of 01/19/2019. Electronically Signed   By: Anner Crete M.D.   On: 01/20/2019 19:46   Dg Abd Portable 1v-small Bowel Obstruction Protocol-initial, 8 Hr Delay  Result Date: 01/20/2019 CLINICAL DATA:  68 year old female with small bowel obstruction. EXAM: PORTABLE ABDOMEN - 1 VIEW COMPARISON:  Earlier radiograph dated 01/19/2019 FINDINGS: Partially visualized enteric tube with tip in the distal stomach. Interval improvement of the dilatation of the small bowel compared to the prior radiograph. A mildly dilated loop of bowel is noted in the left upper abdomen measuring up to 3.6 cm. The osseous structures and soft tissues are grossly unremarkable. IMPRESSION: Interval improvement of the small-bowel obstruction. Electronically Signed   By: Anner Crete M.D.   On: 01/20/2019 02:15   Dg Abd Portable 1v-small Bowel  Protocol-position Verification  Result Date: 01/19/2019 CLINICAL DATA:  Check gastric catheter placement EXAM: PORTABLE ABDOMEN - 1 VIEW COMPARISON:  Film from earlier in the same day. FINDINGS: Gastric catheter is been further advanced into the stomach. Multiple dilated loops of small bowel are noted although slightly improved when compared with the prior exam. IMPRESSION: Nasogastric catheter within the stomach. Slight improvement in the degree of small bowel dilatation when compare with the prior study. Electronically Signed   By: Inez Catalina M.D.   On: 01/19/2019 17:22   Dg Abd Portable 1 View  Result Date: 01/19/2019 CLINICAL DATA:  NG tube placement. EXAM: PORTABLE ABDOMEN - 1 VIEW COMPARISON:  None. FINDINGS: NG tube is  identified with tip overlying the proximal stomach. The side hole size overlies the EG junction. IMPRESSION: NG tube with tip overlying the proximal stomach and side hole overlying the EG junction. Recommend advancement. Electronically Signed   By: Margarette Canada M.D.   On: 01/19/2019 15:17    Anti-infectives: Anti-infectives (From admission, onward)   None      Assessment/Plan: Problem List: Patient Active Problem List   Diagnosis Date Noted  . HTN (hypertension) 01/20/2019  . HLD (hyperlipidemia) 01/20/2019  . SBO (small bowel obstruction) ([place]) 01/19/2019  . Hx of adenomatous colonic polyps 05/14/2015  . Prolapsed internal hemorrhoids, grade 3 05/09/2015    Persistent partial small bowel obstruction-would continue NG and if no progression in the next day she may need laparotomy * No surgery found *    LOS: 2 days   Matt B. Hassell Done, MD, [facility] [phone] beeper [phone]  01/21/2019 9:15 AM

## 2019-01-22 ENCOUNTER — Inpatient Hospital Stay (HOSPITAL_COMMUNITY): Payer: Medicare Other | Admitting: Certified Registered"

## 2019-01-22 ENCOUNTER — Inpatient Hospital Stay (HOSPITAL_COMMUNITY): Payer: Medicare Other

## 2019-01-22 ENCOUNTER — Encounter (HOSPITAL_COMMUNITY)
Admission: EM | Disposition: A | Discharge status: Home / Self Care | Payer: Self-pay | Source: Home / Self Care | Attending: Internal Medicine

## 2019-01-22 HISTORY — PX: LAPAROTOMY: SHX154

## 2019-01-22 HISTORY — PX: LAPAROSCOPY: SHX197

## 2019-01-22 HISTORY — PX: LYSIS OF ADHESION: SHX5961

## 2019-01-22 LAB — CBC
HCT: 35.4 % — ABNORMAL LOW (ref 36.0–46.0)
Hemoglobin: 11.1 g/dL — ABNORMAL LOW (ref 12.0–15.0)
MCH: 29.4 pg (ref 26.0–34.0)
MCHC: 31.4 g/dL (ref 30.0–36.0)
MCV: 93.7 fL (ref 80.0–100.0)
Platelets: 396 10*3/uL (ref 150–400)
RBC: 3.78 MIL/uL — ABNORMAL LOW (ref 3.87–5.11)
RDW: 17.4 % — ABNORMAL HIGH (ref 11.5–15.5)
WBC: 5.5 10*3/uL (ref 4.0–10.5)
nRBC: 0 % (ref 0.0–0.2)

## 2019-01-22 LAB — BASIC METABOLIC PANEL
Anion gap: 13 (ref 5–15)
BUN: 5 mg/dL — ABNORMAL LOW (ref 8–23)
CO2: 24 mmol/L (ref 22–32)
Calcium: 10.1 mg/dL (ref 8.9–10.3)
Chloride: 112 mmol/L — ABNORMAL HIGH (ref 98–111)
Creatinine, Ser: 0.89 mg/dL (ref 0.44–1.00)
GFR calc Af Amer: >60 mL/min (ref >60)
GFR calc non Af Amer: >60 mL/min (ref >60)
Glucose, Bld: 120 mg/dL — ABNORMAL HIGH (ref 70–99)
Potassium: 3.4 mmol/L — ABNORMAL LOW (ref 3.5–5.1)
Sodium: 149 mmol/L — ABNORMAL HIGH (ref 135–145)

## 2019-01-22 LAB — SURGICAL PCR SCREEN
MRSA, PCR: NEGATIVE
Staphylococcus aureus: NEGATIVE

## 2019-01-22 LAB — MAGNESIUM: Magnesium: 2.3 mg/dL (ref 1.7–2.4)

## 2019-01-22 SURGERY — LAPAROSCOPY, DIAGNOSTIC
Anesthesia: General | Site: Abdomen

## 2019-01-22 MED ORDER — ACETAMINOPHEN 10 MG/ML IV SOLN
1000.0000 mg | Freq: Four times a day (QID) | INTRAVENOUS | Status: DC
  Administered 2019-01-22 – 2019-01-23 (×3): 1000 mg via INTRAVENOUS
  Filled 2019-01-22 (×3): qty 100

## 2019-01-22 MED ORDER — SODIUM CHLORIDE 0.9 % IV SOLN
INTRAVENOUS | Status: DC | PRN
  Administered 2019-01-22: 15:00:00 via INTRAVENOUS
  Administered 2019-01-22: 75 ug/min via INTRAVENOUS

## 2019-01-22 MED ORDER — DEXTROSE 5 % IV SOLN
INTRAVENOUS | Status: AC
  Administered 2019-01-22 – 2019-01-23 (×3): via INTRAVENOUS

## 2019-01-22 MED ORDER — ROCURONIUM BROMIDE 10 MG/ML (PF) SYRINGE
PREFILLED_SYRINGE | INTRAVENOUS | Status: AC
  Filled 2019-01-22: qty 10

## 2019-01-22 MED ORDER — BUPIVACAINE LIPOSOME 1.3 % IJ SUSP
INTRAMUSCULAR | Status: DC | PRN
  Administered 2019-01-22: 20 mL

## 2019-01-22 MED ORDER — LIDOCAINE 2% (20 MG/ML) 5 ML SYRINGE
INTRAMUSCULAR | Status: AC
  Filled 2019-01-22: qty 5

## 2019-01-22 MED ORDER — BUPIVACAINE-EPINEPHRINE (PF) 0.25% -1:200000 IJ SOLN
INTRAMUSCULAR | Status: AC
  Filled 2019-01-22: qty 30

## 2019-01-22 MED ORDER — PROMETHAZINE HCL 25 MG/ML IJ SOLN: 6.2500 mg | INTRAMUSCULAR | Status: DC | PRN

## 2019-01-22 MED ORDER — FENTANYL CITRATE (PF) 250 MCG/5ML IJ SOLN
INTRAMUSCULAR | Status: AC
  Filled 2019-01-22: qty 5

## 2019-01-22 MED ORDER — LACTATED RINGERS IV SOLN
INTRAVENOUS | Status: DC
  Administered 2019-01-22 (×2): via INTRAVENOUS

## 2019-01-22 MED ORDER — PROPOFOL 10 MG/ML IV BOLUS
INTRAVENOUS | Status: DC | PRN
  Administered 2019-01-22: 90 mg via INTRAVENOUS

## 2019-01-22 MED ORDER — CHLORHEXIDINE GLUCONATE CLOTH 2 % EX PADS: 6.0000 | MEDICATED_PAD | Freq: Every day | CUTANEOUS | Status: DC

## 2019-01-22 MED ORDER — LIDOCAINE 2% (20 MG/ML) 5 ML SYRINGE
INTRAMUSCULAR | Status: DC | PRN
  Administered 2019-01-22: 60 mg via INTRAVENOUS

## 2019-01-22 MED ORDER — MIDAZOLAM HCL 2 MG/2ML IJ SOLN
INTRAMUSCULAR | Status: AC
  Filled 2019-01-22: qty 2

## 2019-01-22 MED ORDER — SUCCINYLCHOLINE CHLORIDE 200 MG/10ML IV SOSY
PREFILLED_SYRINGE | INTRAVENOUS | Status: AC
  Filled 2019-01-22: qty 10

## 2019-01-22 MED ORDER — METRONIDAZOLE IVPB CUSTOM
1000.0000 mg | Freq: Once | INTRAVENOUS | Status: AC
  Administered 2019-01-22: 1000 mg via INTRAVENOUS
  Filled 2019-01-22: qty 200

## 2019-01-22 MED ORDER — DEXAMETHASONE SODIUM PHOSPHATE 10 MG/ML IJ SOLN
INTRAMUSCULAR | Status: DC | PRN
  Administered 2019-01-22: 10 mg via INTRAVENOUS

## 2019-01-22 MED ORDER — PROPOFOL 10 MG/ML IV BOLUS
INTRAVENOUS | Status: AC
  Filled 2019-01-22: qty 20

## 2019-01-22 MED ORDER — 0.9 % SODIUM CHLORIDE (POUR BTL) OPTIME
TOPICAL | Status: DC | PRN
  Administered 2019-01-22: 13:00:00 2000 mL

## 2019-01-22 MED ORDER — PHENYLEPHRINE HCL (PRESSORS) 10 MG/ML IV SOLN
INTRAVENOUS | Status: DC | PRN
  Administered 2019-01-22: 40 ug via INTRAVENOUS
  Administered 2019-01-22: 160 ug via INTRAVENOUS
  Administered 2019-01-22: 200 ug via INTRAVENOUS
  Administered 2019-01-22: 160 ug via INTRAVENOUS
  Administered 2019-01-22: 80 ug via INTRAVENOUS
  Administered 2019-01-22: 40 ug via INTRAVENOUS
  Administered 2019-01-22: 80 ug via INTRAVENOUS

## 2019-01-22 MED ORDER — CIPROFLOXACIN IN D5W 400 MG/200ML IV SOLN
400.0000 mg | Freq: Once | INTRAVENOUS | Status: AC
  Administered 2019-01-22: 13:00:00 400 mg via INTRAVENOUS
  Filled 2019-01-22 (×2): qty 200

## 2019-01-22 MED ORDER — METHOCARBAMOL 1000 MG/10ML IJ SOLN
1000.0000 mg | Freq: Three times a day (TID) | INTRAVENOUS | Status: DC
  Administered 2019-01-22 – 2019-01-26 (×12): 1000 mg via INTRAVENOUS
  Filled 2019-01-22 (×15): qty 10

## 2019-01-22 MED ORDER — POTASSIUM CHLORIDE 10 MEQ/100ML IV SOLN
10.0000 meq | INTRAVENOUS | Status: AC
  Administered 2019-01-22 (×2): 10 meq via INTRAVENOUS
  Filled 2019-01-22 (×2): qty 100

## 2019-01-22 MED ORDER — SUGAMMADEX SODIUM 200 MG/2ML IV SOLN
INTRAVENOUS | Status: DC | PRN
  Administered 2019-01-22: 340 mg via INTRAVENOUS

## 2019-01-22 MED ORDER — ENOXAPARIN SODIUM 40 MG/0.4ML ~~LOC~~ SOLN
40.0000 mg | SUBCUTANEOUS | Status: DC
  Administered 2019-01-23 – 2019-01-28 (×6): 40 mg via SUBCUTANEOUS
  Filled 2019-01-22 (×8): qty 0.4

## 2019-01-22 MED ORDER — MIDAZOLAM HCL 2 MG/2ML IJ SOLN: 0.5000 mg | Freq: Once | INTRAMUSCULAR | Status: DC | PRN

## 2019-01-22 MED ORDER — FENTANYL CITRATE (PF) 100 MCG/2ML IJ SOLN
25.0000 ug | INTRAMUSCULAR | Status: DC | PRN
  Administered 2019-01-22 (×4): 25 ug via INTRAVENOUS

## 2019-01-22 MED ORDER — DEXAMETHASONE SODIUM PHOSPHATE 10 MG/ML IJ SOLN
INTRAMUSCULAR | Status: AC
  Filled 2019-01-22: qty 2

## 2019-01-22 MED ORDER — BUPIVACAINE-EPINEPHRINE 0.25% -1:200000 IJ SOLN
INTRAMUSCULAR | Status: DC | PRN
  Administered 2019-01-22: 30 mL

## 2019-01-22 MED ORDER — MEPERIDINE HCL 25 MG/ML IJ SOLN: 6.2500 mg | INTRAMUSCULAR | Status: DC | PRN

## 2019-01-22 MED ORDER — SUCCINYLCHOLINE CHLORIDE 20 MG/ML IJ SOLN
INTRAMUSCULAR | Status: DC | PRN
  Administered 2019-01-22: 80 mg via INTRAVENOUS

## 2019-01-22 MED ORDER — FENTANYL CITRATE (PF) 100 MCG/2ML IJ SOLN
INTRAMUSCULAR | Status: DC | PRN
  Administered 2019-01-22 (×4): 50 ug via INTRAVENOUS
  Administered 2019-01-22: 100 ug via INTRAVENOUS

## 2019-01-22 MED ORDER — FENTANYL CITRATE (PF) 100 MCG/2ML IJ SOLN
INTRAMUSCULAR | Status: AC
  Filled 2019-01-22: qty 2

## 2019-01-22 MED ORDER — ALBUMIN HUMAN 5 % IV SOLN
INTRAVENOUS | Status: DC | PRN
  Administered 2019-01-22 (×4): via INTRAVENOUS

## 2019-01-22 MED ORDER — ROCURONIUM BROMIDE 10 MG/ML (PF) SYRINGE
PREFILLED_SYRINGE | INTRAVENOUS | Status: DC | PRN
  Administered 2019-01-22: 50 mg via INTRAVENOUS
  Administered 2019-01-22: 100 mg via INTRAVENOUS

## 2019-01-22 MED ORDER — MIDAZOLAM HCL 5 MG/5ML IJ SOLN
INTRAMUSCULAR | Status: DC | PRN
  Administered 2019-01-22: 2 mg via INTRAVENOUS

## 2019-01-22 SURGICAL SUPPLY — 61 items
ADH SKN CLS APL DERMABOND .7 (GAUZE/BANDAGES/DRESSINGS)
APL PRP STRL LF DISP 70% ISPRP (MISCELLANEOUS) ×3
BLADE CLIPPER SURG (BLADE) ×1 IMPLANT
CANISTER SUCT 3000ML PPV (MISCELLANEOUS) ×4 IMPLANT
CHLORAPREP W/TINT 26 (MISCELLANEOUS) ×4 IMPLANT
COVER SURGICAL LIGHT HANDLE (MISCELLANEOUS) ×4 IMPLANT
COVER WAND RF STERILE (DRAPES) ×3 IMPLANT
DECANTER SPIKE VIAL GLASS SM (MISCELLANEOUS) ×6 IMPLANT
DERMABOND ADVANCED (GAUZE/BANDAGES/DRESSINGS)
DERMABOND ADVANCED .7 DNX12 (GAUZE/BANDAGES/DRESSINGS) ×3 IMPLANT
DRAPE LAPAROSCOPIC ABDOMINAL (DRAPES) ×4 IMPLANT
DRAPE WARM FLUID 44X44 (DRAPES) ×4 IMPLANT
DRSG OPSITE POSTOP 4X10 (GAUZE/BANDAGES/DRESSINGS) ×1 IMPLANT
DRSG OPSITE POSTOP 4X8 (GAUZE/BANDAGES/DRESSINGS) IMPLANT
ELECT BLADE 6.5 EXT (BLADE) IMPLANT
ELECT CAUTERY BLADE 6.4 (BLADE) ×4 IMPLANT
ELECT REM PT RETURN 9FT ADLT (ELECTROSURGICAL) ×4
ELECTRODE REM PT RTRN 9FT ADLT (ELECTROSURGICAL) ×3 IMPLANT
GAUZE SPONGE 4X4 12PLY STRL (GAUZE/BANDAGES/DRESSINGS) ×4 IMPLANT
GLOVE BIOGEL M STRL SZ7.5 (GLOVE) ×8 IMPLANT
GLOVE INDICATOR 8.0 STRL GRN (GLOVE) ×8 IMPLANT
GLOVE SURG SIGNA 7.5 PF LTX (GLOVE) ×1 IMPLANT
GOWN STRL REUS W/ TWL LRG LVL3 (GOWN DISPOSABLE) ×9 IMPLANT
GOWN STRL REUS W/ TWL XL LVL3 (GOWN DISPOSABLE) IMPLANT
GOWN STRL REUS W/TWL 2XL LVL3 (GOWN DISPOSABLE) ×4 IMPLANT
GOWN STRL REUS W/TWL LRG LVL3 (GOWN DISPOSABLE) ×8
GOWN STRL REUS W/TWL XL LVL3 (GOWN DISPOSABLE) ×4
HANDLE SUCTION POOLE (INSTRUMENTS) ×3 IMPLANT
KIT BASIN OR (CUSTOM PROCEDURE TRAY) ×4 IMPLANT
KIT TURNOVER KIT B (KITS) ×4 IMPLANT
LIGASURE IMPACT 36 18CM CVD LR (INSTRUMENTS) ×3 IMPLANT
NS IRRIG 1000ML POUR BTL (IV SOLUTION) ×8 IMPLANT
PACK GENERAL/GYN (CUSTOM PROCEDURE TRAY) ×4 IMPLANT
PAD ARMBOARD 7.5X6 YLW CONV (MISCELLANEOUS) ×6 IMPLANT
PENCIL SMOKE EVACUATOR (MISCELLANEOUS) ×4 IMPLANT
SCISSORS LAP 5X35 DISP (ENDOMECHANICALS) ×1 IMPLANT
SET IRRIG TUBING LAPAROSCOPIC (IRRIGATION / IRRIGATOR) IMPLANT
SET TUBE SMOKE EVAC HIGH FLOW (TUBING) ×4 IMPLANT
SHEARS HARMONIC ACE PLUS 36CM (ENDOMECHANICALS) IMPLANT
SLEEVE ENDOPATH XCEL 5M (ENDOMECHANICALS) ×4 IMPLANT
SPECIMEN JAR LARGE (MISCELLANEOUS) ×4 IMPLANT
SPONGE LAP 18X18 RF (DISPOSABLE) IMPLANT
STAPLER VISISTAT 35W (STAPLE) ×4 IMPLANT
SUCTION POOLE HANDLE (INSTRUMENTS) ×4
SUT MNCRL AB 4-0 PS2 18 (SUTURE) ×3 IMPLANT
SUT PDS AB 1 TP1 96 (SUTURE) ×7 IMPLANT
SUT SILK 2 0 SH CR/8 (SUTURE) ×4 IMPLANT
SUT SILK 2 0 TIES 10X30 (SUTURE) ×4 IMPLANT
SUT SILK 3 0 SH CR/8 (SUTURE) ×4 IMPLANT
SUT SILK 3 0 TIES 10X30 (SUTURE) ×4 IMPLANT
SUT VIC AB 3-0 SH 18 (SUTURE) IMPLANT
TOWEL GREEN STERILE (TOWEL DISPOSABLE) ×4 IMPLANT
TOWEL GREEN STERILE FF (TOWEL DISPOSABLE) ×4 IMPLANT
TRAY FOLEY MTR SLVR 14FR STAT (SET/KITS/TRAYS/PACK) ×3 IMPLANT
TRAY FOLEY MTR SLVR 16FR STAT (SET/KITS/TRAYS/PACK) ×1 IMPLANT
TRAY LAPAROSCOPIC MC (CUSTOM PROCEDURE TRAY) ×4 IMPLANT
TROCAR XCEL BLUNT TIP 100MML (ENDOMECHANICALS) IMPLANT
TROCAR XCEL NON-BLD 11X100MML (ENDOMECHANICALS) IMPLANT
TROCAR XCEL NON-BLD 5MMX100MML (ENDOMECHANICALS) ×4 IMPLANT
TUBE CONNECTING 12X1/4 (SUCTIONS) ×1 IMPLANT
YANKAUER SUCT BULB TIP NO VENT (SUCTIONS) ×5 IMPLANT

## 2019-01-22 NOTE — Transfer of Care (Signed)
Immediate Anesthesia Transfer of Care Note  Patient: AMYIA LODWICK  Procedure(s) Performed: LAPAROSCOPY DIAGNOSTIC (N/A Abdomen) EXPLORATORY LAPAROTOMY (N/A Abdomen) Lysis Of  Adhesions x 1 hour (Abdomen)  Patient Location: PACU  Anesthesia Type:General  Level of Consciousness: drowsy and patient cooperative  Airway & Oxygen Therapy: Patient Spontanous Breathing and Patient connected to face mask oxygen  Post-op Assessment: Report given to RN and Post -op Vital signs reviewed and stable  Post vital signs: Reviewed and stable  Last Vitals:  Vitals Value Taken Time  BP 130/83 01/22/19 1540  Temp 36.6 C 01/22/19 1540  Pulse 100 01/22/19 1542  Resp 20 01/22/19 1542  SpO2 92 % 01/22/19 1542  Vitals shown include unvalidated device data.  Last Pain:  Vitals:   01/22/19 1540  TempSrc:   PainSc: (P) 0-No pain      Patients Stated Pain Goal: 3 (08/27/47 9692)  Complications: No apparent anesthesia complications

## 2019-01-22 NOTE — Brief Op Note (Signed)
01/22/2019  3:33 PM  PATIENT:  [name]  68 y.o. female  PRE-OPERATIVE DIAGNOSIS:  small bowel obstruction  POST-OPERATIVE DIAGNOSIS:  small bowel obstruction  PROCEDURE:  Procedure(s): LAPAROSCOPY DIAGNOSTIC (N/A) EXPLORATORY LAPAROTOMY (N/A) Lysis Of  Adhesions x 1 hour  SURGEON:  Surgeon(s) and Role:    Greer Pickerel, MD - Primary    * Alphonsa Overall, MD - Assisting  PHYSICIAN ASSISTANT:   ASSISTANTS: see above   ANESTHESIA:   general  EBL:  100 mL   BLOOD ADMINISTERED:none  DRAINS: Nasogastric Tube and Urinary Catheter (Foley)   LOCAL MEDICATIONS USED:  MARCAINE    and OTHER Exparel  SPECIMEN:  No Specimen  DISPOSITION OF SPECIMEN:  N/A  COUNTS:  YES  TOURNIQUET:  * No tourniquets in log *  DICTATION: .Other Dictation: Dictation Number [id number]  PLAN OF CARE: Admit to inpatient   PATIENT DISPOSITION:  PACU - hemodynamically stable.   Delay start of Pharmacological VTE agent (>24hrs) due to surgical blood loss or risk of bleeding: no  Leighton Ruff. Redmond Pulling, MD, FACS General, Bariatric, & Minimally Invasive Surgery [facility], [place]

## 2019-01-22 NOTE — Progress Notes (Signed)
Leatrice Jewels, RN picked up wedding band.

## 2019-01-22 NOTE — Op Note (Signed)
NAME: [name], [name]. MEDICAL RECORD ZO:[id number] ACCOUNT [id number] DATE OF BIRTH:11/13/1950 FACILITY: [facility] LOCATION: [facility] PHYSICIAN:Julaine Zimny Elson Clan, MD  OPERATIVE REPORT  DATE OF PROCEDURE:  01/22/2019  PREOPERATIVE DIAGNOSIS:  Small-bowel obstruction.  POSTOPERATIVE DIAGNOSIS:  Small-bowel obstruction.  PROCEDURE:  Diagnostic laparoscopy converted to exploratory laparotomy, adhesiolysis for one hour.  SURGEON:  Mary Sella. Andrey Campanile, MD FACS  ASSISTANT:  Ovidio Kin MD FACS (assistant requested due to dense numerous pelvic adhesions to aid in mobilizing/retracting tissue)  ANESTHESIA:  General, 100 mL local combination of Marcaine and Exparel infiltrated in the abdominal wall muscle.  ESTIMATED BLOOD LOSS:  Minimal.    SPECIMEN:  None.  FINDINGS:  The patient had numerous interloop adhesions in her small bowel.  She also had adhesions to the peritoneum in the pelvis, but she had 2 focal areas of transition one more prominent than another, but there are definitely 2 chronic areas of  obstruction.  These were lysed.  There were two areas of serosal tears in the small bowel, which was not an unexpected occurrence and these were primarily repaired with several interrupted 3-0 silk sutures in a Lembert fashion.  INDICATIONS:  The patient is a 68 year old female with prior surgery for an ovarian cystectomy followed by hysterectomy who presented to the hospital several days ago with a small-bowel obstruction.  There appeared to be possibly 2 areas of transition on  her CT scan.  A nasogastric tube was placed and she was placed on the small bowel protocol.  Today, the patient remained obstipated.  Even though there was a little bit of contrast in her cecum, the patient still had dilation of small bowel up to 4 cm  and remained obstipated.  Therefore, I recommended surgical intervention.  We discussed the pros and cons of surgery.  We had an extensive discussion regarding risks and  benefits which is separately documented.  DESCRIPTION OF PROCEDURE:  The patient was continued on chemical DVT prophylaxis.  She was taken to OR [facility] at [facility], placed supine on the operating room table.  General endotracheal anesthesia was established.  Sequential compression devices  were placed.  A Foley catheter was placed.  Her left arm was tucked at her side with the appropriate padding.  She received IV antibiotic prior to skin incision.  A surgical timeout was performed.    Access to the abdomen was gained with the Optiview technique in the left upper quadrant.  A small incision was made below the left subcostal margin.  Then, using a 5 mm 0 degree laparoscope with a 5 mm trocar, I advanced it through all layers of the  abdominal cavity and entered the abdomen.  Pneumoperitoneum was smoothly established up to a patient pressure of 15 mmHg.  There was no evidence of injury to surrounding structures.  The abdominal cavity was surveilled.  There were numerous dilated loops  of small bowel.  There were loops of small bowel adhered to the lower midline slightly to the right of the lower midline.  I decided to see if I could find the point of transition laparoscopically.  The bowel that was adhered to the lower midline and to  the right of the lower midline was decompressed, so I felt that perhaps if I could run the bowel, I could come across the adhesive bands.  Two 5 mm trocars were placed in the left lateral abdomen under direct visualization.  I identified 1 or 2  interloop bands which were taken down with EndoShears  without electrocautery.  The patient was placed in Trendelenburg.  It became obvious very soon that there were numerous loops of small bowel tethered in the pelvis.  Given the degree of small bowel  distention, I did not feel it was safe to proceed laparoscopically.  Therefore, we converted to exploratory laparotomy.  Midline incision was made starting about 2 inches above her  umbilicus and extending inferiorly below her belly button for about 3  inches.  Subcutaneous tissue was divided with electrocautery.  The fascia was incised.  I started taking down the small bowel that was adhered to the lower abdominal wall with a pair of Metzenbaum scissors.  I took down the lateral adhesions in the right  lower quadrant and left lower quadrant as well.  I also took down some interloop adhesions as well.  At this point, I extended the lower midline incision further and continued to take down small bowel from the anterior abdominal midline as I extended my  inferior portion of the fascial incision.  The bowel was taken down with a pair of Metzenbaum scissors.  At this point, Dr. Ezzard Standing joined me in the operating room to assist with retracting tissue.  It became obvious that the small bowel was adhered to  the peritoneum in the pelvis.  This is mainly in the left lower quadrant and in the pelvis.  He lifted up on the peritoneal edge with a pair of DeBakeys while I took down the adhesive bands with a pair of Metzenbaum scissors.  The small bowel was also  adhered to the dome of the bladder.  We were able to get in a thin avascular plane and completely mobilize the small bowel off the dome of the bladder.  At this point, the small bowel was freed and delivered up out of the pelvis.  At this point, we were  able to identify two areas of small bowel that had narrowing due to a chronic adhesive band.  These were taken down with a pair of Metzenbaum scissors.  The small bowel was not strictured in either location.  It accommodated.  We were able to milk small  bowel contents through each area without any evidence of ongoing stricture.  Additional interloop adhesions were taken down with a pair of Metzenbaum scissors.  At this point, we ran the bowel from the terminal ileum all the way up to the ligament of  Treitz.  Remaining thin intraloop adhesions were taken down with a pair of Metzenbaum  scissors.  At this point, we confirmed placement of the NG tube in the stomach.  We then milked some of the small bowel contents from the dilated portion of the small  bowel back into the stomach in order to help decompress the small bowel.  Upon running the small bowel again we came across two areas where there was serosal tears which was not an unexpected occurrence.  One area was about 1 cm and this was repaired  with two interrupted 3-0 silk sutures in a Lembert fashion.  The more distal serosal tear was a little bit larger, but it was not full thickness.  It was repaired with six interrupted 3-0 silk sutures in Lembert fashion.  We re-ran the small bowel.   There is no evidence of enterotomy or additional serosal tear.  At this point, the viscera was returned to the abdominal cavity.  The abdomen was irrigated.  We then infiltrated a mixture of 20 mL of Exparel with 30 mL of  Marcaine into the preperitoneal  space in a circumferential manner.  The abdomen was then closed with a running #1 looped PDS, one from above, one from below and tied centrally.  Soft tissue was irrigated and skin was closed with skin staples.  The three laparoscopic trocar sites were  closed with skin staples as well.  Sterile bandages were applied.  All needle, instrument counts were correct x2.  There were no immediate complications.  The patient tolerated the procedure well.  TN/NUANCE  D:01/22/2019 T:01/22/2019 JOB:[id number]

## 2019-01-22 NOTE — Progress Notes (Signed)
[facility] Progress Note     Subjective: CC: sbo Patient still distended. No flatus or BM. Some pain that feels like it is wrapping around her abdomen at times. Patient wanting to go home and at this time is wanting surgical intervention.   Objective: Vital signs in last 24 hours: Temp:  [97.7 F (36.5 C)-98.7 F (37.1 C)] 98.7 F (37.1 C) (07/27 0529) Pulse Rate:  [86-97] 97 (07/27 0529) Resp:  [16-18] 18 (07/27 0529) BP: (127-136)/(83-91) 136/89 (07/27 0529) SpO2:  [95 %-99 %] 95 % (07/27 0529) Last BM Date: 01/17/19  Intake/Output from previous day: 07/26 0701 - 07/27 0700 In: 1370 [P.O.:120; I.V.:1250] Out: 1751 [WCHEN:2778; Emesis/NG output:2050] Intake/Output this shift: No intake/output data recorded.  PE: Gen:  Alert, NAD, pleasant Card:  Regular rate and rhythm Pulm:  Normal effort, clear to auscultation bilaterally Abd: Soft, mildly TTP in epigastrium, no peritonitis, distended, BS hypoactive; NGT with bilious drainage Skin: warm and dry, no rashes  Psych: A&Ox3   Lab Results:  Recent Labs    01/20/19 0223 01/22/19 0415  WBC 4.1 5.5  HGB 11.2* 11.1*  HCT 35.6* 35.4*  PLT 420* 396   BMET Recent Labs    01/21/19 1054 01/22/19 0415  NA 148* 149*  K 3.7 3.4*  CL 115* 112*  CO2 25 24  GLUCOSE 116* 120*  BUN 6* 5*  CREATININE 0.84 0.89  CALCIUM 9.8 10.1   PT/INR No results for input(s): LABPROT, INR in the last 72 hours. CMP     Component Value Date/Time   NA 149 (H) 01/22/2019 0415   K 3.4 (L) 01/22/2019 0415   CL 112 (H) 01/22/2019 0415   CO2 24 01/22/2019 0415   GLUCOSE 120 (H) 01/22/2019 0415   BUN 5 (L) 01/22/2019 0415   CREATININE 0.89 01/22/2019 0415   CALCIUM 10.1 01/22/2019 0415   PROT 6.8 01/20/2019 0223   ALBUMIN 3.6 01/20/2019 0223   AST 12 (L) 01/20/2019 0223   ALT 12 01/20/2019 0223   ALKPHOS 78 01/20/2019 0223   BILITOT 1.2 01/20/2019 0223   GFRNONAA >60 01/22/2019 0415   GFRAA >60 01/22/2019 0415    Lipase     Component Value Date/Time   LIPASE 31 01/19/2019 1046       Studies/Results: Dg Abd Portable 1v  Result Date: 01/22/2019 CLINICAL DATA:  Abdominal pain. EXAM: PORTABLE ABDOMEN - 1 VIEW COMPARISON:  01/20/2019; 01/19/2019; CT abdomen pelvis-01/19/2019 FINDINGS: Enteric tube tip and side port projected the expected location of the gastric antrum. There is persistent gaseous distention of several loops of small bowel with index loop of small bowel in the left mid hemiabdomen measuring approximately 4.4 cm in diameter, grossly unchanged compared to the 01/20/2019 examination, previously, index loop measures 4.1 cm. A small amount of enteric contrast is seen within the cecum and ascending colon however there is an otherwise conspicuous paucity of distal colonic gas. No supine evidence of pneumoperitoneum. No pneumatosis or portal venous gas. Punctate phleboliths overlie the lower pelvis bilaterally. Limited visualization of lower thorax demonstrates minimal left basilar/retrocardiac heterogeneous opacities favored to represent atelectasis. No acute osseous abnormalities. Mild scoliotic curvature of the thoracolumbar spine with associated mild to moderate multilevel DDD, incompletely evaluated. IMPRESSION: Similar findings worrisome for small bowel obstruction. Electronically Signed   By: Sandi Mariscal M.D.   On: 01/22/2019 07:34   Dg Abd Portable 1v-small Bowel Obstruction Protocol-24 Hr Delay  Result Date: 01/20/2019 CLINICAL DATA:  68 year old female with follow-up small-bowel obstruction. EXAM:  PORTABLE ABDOMEN - 1 VIEW COMPARISON:  Multiple prior radiographs dating back to CT of 01/19/2019. FINDINGS: Enteric tube with tip in the distal stomach. Small amount of oral contrast noted in the stomach. Interval improvement of the previously seen dilated small bowel loops. There is however a persistent dilatation of a loop of small bowel in the mid abdomen measuring up to 4 cm in diameter. A  small focus of calcification is noted in the right upper quadrant. The osseous structures and soft tissues are unremarkable. IMPRESSION: Persistent dilatation of a loop of small bowel in the mid abdomen measuring up to 4 cm. Overall improved appearance of the bowel compared to the radiograph of 01/19/2019. Electronically Signed   By: Anner Crete M.D.   On: 01/20/2019 19:46    Anti-infectives: Anti-infectives (From admission, onward)   None       Assessment/Plan HTN Hx of adenomatous colon polyps Depression/Anxiety  Partial SBO - CT scan showed partial small bowel obstruction with apparent transition point in the pelvis - Film this AM with persistent sbo - NG tube with 2L out in last 24h, may be slightly skewed by PO ice chips - correcting potassium - Has not progressed on protocol, patient may require non-urgent surgical intervention. Further recommendations per MD.   FEN: NPO, IVF; NGT to LIWS VTE: SCDs, lovenox ID: no current abx  LOS: 3 days    Brigid Re , [facility] 01/22/2019, 7:40 AM Pager: [phone] Consults: [phone]

## 2019-01-22 NOTE — Care Management Important Message (Signed)
Important Message  Patient Details  Name: [name] MRN: [id number] Date of Birth: 14-Nov-1950   Medicare Important Message Given:  Yes     [name] 01/22/2019, 3:38 PM

## 2019-01-22 NOTE — Progress Notes (Signed)
PROGRESS NOTE    [name]   NWG:[id number]  DOB: 07/25/1950  DOA: 01/19/2019 PCP: Gregor Hams, FNP   Brief Narrative:  [name] is a 68 y.o. female with medical history significant for gynecological abdominal surgeries x2, essential hypertension, chronic depression/anxiety/bipolar disorder, who presented to [facility] ED with complaints of severe abdominal pain associated with nausea and vomiting x4 days.  CT abdomen and pelvis with contrast revealed partial small bowel obstruction.    Subjective: No improvement in abdominal distension. No nausea or pain.    Assessment & Plan:   Principal Problem:   SBO (small bowel obstruction)  - cont NG tube per general surgery -cont IVF  Active Problems: Dehydration - urine output improved after increasing IVF and bolus- cont to follow   Burning eyes - possibly dry eyes- drops have helped    HTN (hypertension) - she takes Amlodipine at at home which is on hold- Labetalol ordered PRN    HLD (hyperlipidemia) - hold Lipitor  Anxiety/depression - holding Cymbalta and Lithium due to SBO   Time spent in minutes: 35 DVT prophylaxis: Lovenox Code Status: Full code Family Communication:  Disposition Plan: home when stable Consultants:   General surgery Procedures:   NG tube Antimicrobials:  Anti-infectives (From admission, onward)   Start     Dose/Rate Route Frequency Ordered Stop   01/22/19 0845  [MAR Hold]  ciprofloxacin (CIPRO) IVPB 400 mg     (MAR Hold since Mon 01/22/2019 at 0948.Hold Reason: Transfer to a Procedural area.)   400 mg 200 mL/hr over 60 Minutes Intravenous  Once 01/22/19 0841     01/22/19 0845  [MAR Hold]  metroNIDAZOLE (FLAGYL) IVPB 1,000 mg 200 mL     (MAR Hold since Mon 01/22/2019 at 0948.Hold Reason: Transfer to a Procedural area.)   1,000 mg 200 mL/hr over 60 Minutes Intravenous  Once 01/22/19 0841         Objective: Vitals:   01/21/19 0547 01/21/19 1354 01/21/19 2025 01/22/19 0529    BP: 129/80 (!) 128/91 127/83 136/89  Pulse: 93 86 95 97  Resp:  16 17 18   Temp: 98.4 F (36.9 C) 97.7 F (36.5 C) 98.4 F (36.9 C) 98.7 F (37.1 C)  TempSrc: Oral Axillary Oral Oral  SpO2: 97% 99% 98% 95%  Weight:      Height:        Intake/Output Summary (Last 24 hours) at 01/22/2019 1233 Last data filed at 01/22/2019 0925 Gross per 24 hour  Intake 1310 ml  Output 3950 ml  Net -2640 ml   Filed Weights   01/19/19 1042 01/19/19 1725  Weight: 83.9 kg 82.7 kg    Examination: General exam: Appears comfortable  HEENT: PERRLA, oral mucosa moist, no sclera icterus or thrush Respiratory system: Clear to auscultation. Respiratory effort normal. Cardiovascular system: S1 & S2 heard,  No murmurs  Gastrointestinal system: Abdomen soft, non-tender, distended. Poor bowel sounds   Central nervous system: Alert and oriented. No focal neurological deficits. Extremities: No cyanosis, clubbing or edema Skin: No rashes or ulcers Psychiatry:  Mood & affect appropriate.     Data Reviewed: I have personally reviewed following labs and imaging studies  CBC: Recent Labs  Lab 01/19/19 1046 01/20/19 0223 01/22/19 0415  WBC 6.1 4.1 5.5  NEUTROABS  --  2.7  --   HGB 12.2 11.2* 11.1*  HCT 38.0 35.6* 35.4*  MCV 90.5 91.8 93.7  PLT 458* 420* 578   Basic Metabolic Panel: Recent Labs  Lab 01/19/19 1046 01/20/19 0223 01/21/19 1054 01/22/19 0415  NA 137 143 148* 149*  K 3.7 3.6 3.7 3.4*  CL 107 110 115* 112*  CO2 21* 25 25 24   GLUCOSE 125* 137* 116* 120*  BUN 9 8 6* 5*  CREATININE 1.06* 0.97 0.84 0.89  CALCIUM 10.3 10.0 9.8 10.1  MG  --  2.4  --  2.3  PHOS  --  3.5  --   --    GFR: Estimated Creatinine Clearance: 59.4 mL/min (by C-G formula based on SCr of 0.89 mg/dL). Liver Function Tests: Recent Labs  Lab 01/19/19 1046 01/20/19 0223  AST 13* 12*  ALT 12 12  ALKPHOS 87 78  BILITOT 1.7* 1.2  PROT 6.8 6.8  ALBUMIN 3.9 3.6   Recent Labs  Lab 01/19/19 1046  LIPASE 31    No results for input(s): AMMONIA in the last 168 hours. Coagulation Profile: No results for input(s): INR, PROTIME in the last 168 hours. Cardiac Enzymes: No results for input(s): CKTOTAL, CKMB, CKMBINDEX, TROPONINI in the last 168 hours. BNP (last 3 results) No results for input(s): PROBNP in the last 8760 hours. HbA1C: No results for input(s): HGBA1C in the last 72 hours. CBG: No results for input(s): GLUCAP in the last 168 hours. Lipid Profile: No results for input(s): CHOL, HDL, LDLCALC, TRIG, CHOLHDL, LDLDIRECT in the last 72 hours. Thyroid Function Tests: No results for input(s): TSH, T4TOTAL, FREET4, T3FREE, THYROIDAB in the last 72 hours. Anemia Panel: No results for input(s): VITAMINB12, FOLATE, FERRITIN, TIBC, IRON, RETICCTPCT in the last 72 hours. Urine analysis:    Component Value Date/Time   COLORURINE YELLOW 01/19/2019 1046   APPEARANCEUR CLEAR 01/19/2019 1046   LABSPEC 1.012 01/19/2019 1046   PHURINE 6.0 01/19/2019 1046   GLUCOSEU NEGATIVE 01/19/2019 1046   HGBUR NEGATIVE 01/19/2019 [place] 01/19/2019 1046   [place] 01/19/2019 1046   PROTEINUR 100 (A) 01/19/2019 1046   UROBILINOGEN 0.2 03/18/2011 2118   NITRITE NEGATIVE 01/19/2019 1046   LEUKOCYTESUR NEGATIVE 01/19/2019 1046   Sepsis Labs: @LABRCNTIP (procalcitonin:4,lacticidven:4) ) Recent Results (from the past 240 hour(s))  SARS Coronavirus 2 (CEPHEID - Performed in [place] hospital lab), Hosp Order     Status: None   Collection Time: 01/19/19  2:47 PM   Specimen: Nasopharyngeal Swab  Result Value Ref Range Status   SARS Coronavirus 2 NEGATIVE NEGATIVE Final    Comment: (NOTE) If result is NEGATIVE SARS-CoV-2 target nucleic acids are NOT DETECTED. The SARS-CoV-2 RNA is generally detectable in upper and lower  respiratory specimens during the acute phase of infection. The lowest  concentration of SARS-CoV-2 viral copies this assay can detect is 250  copies / mL. A  negative result does not preclude SARS-CoV-2 infection  and should not be used as the sole basis for treatment or other  patient management decisions.  A negative result may occur with  improper specimen collection / handling, submission of specimen other  than nasopharyngeal swab, presence of viral mutation(s) within the  areas targeted by this assay, and inadequate number of viral copies  (<250 copies / mL). A negative result must be combined with clinical  observations, patient history, and epidemiological information. If result is POSITIVE SARS-CoV-2 target nucleic acids are DETECTED. The SARS-CoV-2 RNA is generally detectable in upper and lower  respiratory specimens dur ing the acute phase of infection.  Positive  results are indicative of active infection with SARS-CoV-2.  Clinical  correlation with patient history and other  diagnostic information is  necessary to determine patient infection status.  Positive results do  not rule out bacterial infection or co-infection with other viruses. If result is PRESUMPTIVE POSTIVE SARS-CoV-2 nucleic acids MAY BE PRESENT.   A presumptive positive result was obtained on the submitted specimen  and confirmed on repeat testing.  While 2019 novel coronavirus  (SARS-CoV-2) nucleic acids may be present in the submitted sample  additional confirmatory testing may be necessary for epidemiological  and / or clinical management purposes  to differentiate between  SARS-CoV-2 and other Sarbecovirus currently known to infect humans.  If clinically indicated additional testing with an alternate test  methodology [phone]) is advised. The SARS-CoV-2 RNA is generally  detectable in upper and lower respiratory sp ecimens during the acute  phase of infection. The expected result is Negative. Fact Sheet for Patients:  StrictlyIdeas.no Fact Sheet for Healthcare Providers: BankingDealers.co.za This test is not  yet approved or cleared by the [place] FDA and has been authorized for detection and/or diagnosis of SARS-CoV-2 by FDA under an Emergency Use Authorization (EUA).  This EUA will remain in effect (meaning this test can be used) for the duration of the COVID-19 declaration under Section 564(b)(1) of the Act, 21 U.S.C. section 360bbb-3(b)(1), unless the authorization is terminated or revoked sooner. Performed at [facility], [place] [address] [place], [place] [address]   Surgical pcr screen     Status: None   Collection Time: 01/22/19  9:05 AM   Specimen: Nasal Mucosa; Nasal Swab  Result Value Ref Range Status   MRSA, PCR NEGATIVE NEGATIVE Final   Staphylococcus aureus NEGATIVE NEGATIVE Final    Comment: (NOTE) The Xpert SA Assay (FDA approved for NASAL specimens in patients 27 years of age and older), is one component of a comprehensive surveillance program. It is not intended to diagnose infection nor to guide or monitor treatment. Performed at [facility], [place] [address] [place], [place] [address]          Radiology Studies: Dg Abd Portable 1v  Result Date: 01/22/2019 CLINICAL DATA:  Abdominal pain. EXAM: PORTABLE ABDOMEN - 1 VIEW COMPARISON:  01/20/2019; 01/19/2019; CT abdomen pelvis-01/19/2019 FINDINGS: Enteric tube tip and side port projected the expected location of the gastric antrum. There is persistent gaseous distention of several loops of small bowel with index loop of small bowel in the left mid hemiabdomen measuring approximately 4.4 cm in diameter, grossly unchanged compared to the 01/20/2019 examination, previously, index loop measures 4.1 cm. A small amount of enteric contrast is seen within the cecum and ascending colon however there is an otherwise conspicuous paucity of distal colonic gas. No supine evidence of pneumoperitoneum. No pneumatosis or portal venous gas. Punctate phleboliths overlie the lower pelvis bilaterally. Limited visualization of  lower thorax demonstrates minimal left basilar/retrocardiac heterogeneous opacities favored to represent atelectasis. No acute osseous abnormalities. Mild scoliotic curvature of the thoracolumbar spine with associated mild to moderate multilevel DDD, incompletely evaluated. IMPRESSION: Similar findings worrisome for small bowel obstruction. Electronically Signed   By: Sandi Mariscal M.D.   On: 01/22/2019 07:34   Dg Abd Portable 1v-small Bowel Obstruction Protocol-24 Hr Delay  Result Date: 01/20/2019 CLINICAL DATA:  68 year old female with follow-up small-bowel obstruction. EXAM: PORTABLE ABDOMEN - 1 VIEW COMPARISON:  Multiple prior radiographs dating back to CT of 01/19/2019. FINDINGS: Enteric tube with tip in the distal stomach. Small amount of oral contrast noted in the stomach. Interval improvement of the previously seen dilated small bowel  loops. There is however a persistent dilatation of a loop of small bowel in the mid abdomen measuring up to 4 cm in diameter. A small focus of calcification is noted in the right upper quadrant. The osseous structures and soft tissues are unremarkable. IMPRESSION: Persistent dilatation of a loop of small bowel in the mid abdomen measuring up to 4 cm. Overall improved appearance of the bowel compared to the radiograph of 01/19/2019. Electronically Signed   By: Anner Crete M.D.   On: 01/20/2019 19:46      Scheduled Meds:  [MAR Hold] Chlorhexidine Gluconate Cloth  6 each Topical Q0600   [MAR Hold] enoxaparin (LOVENOX) injection  40 mg Subcutaneous Q24H   Continuous Infusions:  [MAR Hold] ciprofloxacin     dextrose 5% lactated ringers 125 mL/hr at 01/22/19 0623   dextrose 110 mL/hr at 01/22/19 0754   lactated ringers     [MAR Hold] metronidazole       LOS: 3 days      Debbe Odea, MD [facility] Pager: [email or web address] Password [facility] 01/22/2019, 12:33 PM

## 2019-01-22 NOTE — Anesthesia Procedure Notes (Signed)
Procedure Name: Intubation Date/Time: 01/22/2019 1:08 PM Performed by: Lance Coon, CRNA Pre-anesthesia Checklist: Patient identified, Emergency Drugs available, Suction available, Patient being monitored and Timeout performed Patient Re-evaluated:Patient Re-evaluated prior to induction Oxygen Delivery Method: Circle system utilized Preoxygenation: Pre-oxygenation with 100% oxygen Induction Type: IV induction, Rapid sequence and Cricoid Pressure applied Laryngoscope Size: Miller and 3 Grade View: Grade I Tube type: Oral Tube size: 7.0 mm Number of attempts: 1 Airway Equipment and Method: Stylet Placement Confirmation: ETT inserted through vocal cords under direct vision,  positive ETCO2 and breath sounds checked- equal and bilateral Secured at: 21 cm Tube secured with: Tape Dental Injury: Teeth and Oropharynx as per pre-operative assessment

## 2019-01-22 NOTE — Anesthesia Preprocedure Evaluation (Addendum)
Anesthesia Evaluation  Patient identified by MRN, date of birth, ID band Patient awake    Reviewed: Allergy & Precautions, NPO status , Patient's Chart, lab work & pertinent test results  History of Anesthesia Complications Negative for: history of anesthetic complications  Airway Mallampati: II  TM Distance: >3 FB Neck ROM: Full    Dental  (+) Missing, Dental Advisory Given   Pulmonary  01/19/2019 SARS coronavirus NEG   breath sounds clear to auscultation       Cardiovascular hypertension, Pt. on medications (-) angina Rhythm:Regular Rate:Normal     Neuro/Psych negative neurological ROS     GI/Hepatic Neg liver ROS, SBO    Endo/Other  negative endocrine ROS  Renal/GU negative Renal ROS     Musculoskeletal   Abdominal   Peds  Hematology  (+) Blood dyscrasia (Hb 11.1), anemia ,   Anesthesia Other Findings   Reproductive/Obstetrics                            Anesthesia Physical Anesthesia Plan  ASA: II  Anesthesia Plan: General   Post-op Pain Management:    Induction: Rapid sequence and Intravenous  PONV Risk Score and Plan: 4 or greater and Dexamethasone, Ondansetron and Treatment may vary due to age or medical condition  Airway Management Planned: Oral ETT  Additional Equipment:   Intra-op Plan:   Post-operative Plan: Extubation in OR  Informed Consent: I have reviewed the patients History and Physical, chart, labs and discussed the procedure including the risks, benefits and alternatives for the proposed anesthesia with the patient or authorized representative who has indicated his/her understanding and acceptance.     Dental advisory given  Plan Discussed with: CRNA and Surgeon  Anesthesia Plan Comments:        Anesthesia Quick Evaluation

## 2019-01-23 ENCOUNTER — Encounter (HOSPITAL_COMMUNITY): Payer: Self-pay | Admitting: General Surgery

## 2019-01-23 LAB — CBC
HCT: 31.2 % — ABNORMAL LOW (ref 36.0–46.0)
Hemoglobin: 9.9 g/dL — ABNORMAL LOW (ref 12.0–15.0)
MCH: 29.4 pg (ref 26.0–34.0)
MCHC: 31.7 g/dL (ref 30.0–36.0)
MCV: 92.6 fL (ref 80.0–100.0)
Platelets: 351 10*3/uL (ref 150–400)
RBC: 3.37 MIL/uL — ABNORMAL LOW (ref 3.87–5.11)
RDW: 17.5 % — ABNORMAL HIGH (ref 11.5–15.5)
WBC: 9.8 10*3/uL (ref 4.0–10.5)
nRBC: 0 % (ref 0.0–0.2)

## 2019-01-23 LAB — IRON AND TIBC
Iron: 13 ug/dL — ABNORMAL LOW (ref 28–170)
Saturation Ratios: 4 % — ABNORMAL LOW (ref 10.4–31.8)
TIBC: 297 ug/dL (ref 250–450)
UIBC: 284 ug/dL

## 2019-01-23 LAB — RETICULOCYTES
Immature Retic Fract: 25.6 % — ABNORMAL HIGH (ref 2.3–15.9)
RBC.: 3.61 MIL/uL — ABNORMAL LOW (ref 3.87–5.11)
Retic Count, Absolute: 70 10*3/uL (ref 19.0–186.0)
Retic Ct Pct: 1.9 % (ref 0.4–3.1)

## 2019-01-23 LAB — BASIC METABOLIC PANEL
Anion gap: 8 (ref 5–15)
BUN: 9 mg/dL (ref 8–23)
CO2: 23 mmol/L (ref 22–32)
Calcium: 9.5 mg/dL (ref 8.9–10.3)
Chloride: 115 mmol/L — ABNORMAL HIGH (ref 98–111)
Creatinine, Ser: 1.07 mg/dL — ABNORMAL HIGH (ref 0.44–1.00)
GFR calc Af Amer: >60 mL/min (ref >60)
GFR calc non Af Amer: 53 mL/min — ABNORMAL LOW (ref >60)
Glucose, Bld: 131 mg/dL — ABNORMAL HIGH (ref 70–99)
Potassium: 3.9 mmol/L (ref 3.5–5.1)
Sodium: 146 mmol/L — ABNORMAL HIGH (ref 135–145)

## 2019-01-23 LAB — FERRITIN: Ferritin: 56 ng/mL (ref 11–307)

## 2019-01-23 MED ORDER — DEXTROSE 5 % IV SOLN
INTRAVENOUS | Status: DC
  Administered 2019-01-23 – 2019-01-24 (×3): via INTRAVENOUS

## 2019-01-23 MED ORDER — DEXTROSE 5 % IV SOLN: INTRAVENOUS | Status: DC

## 2019-01-23 MED ORDER — ACETAMINOPHEN 10 MG/ML IV SOLN
1000.0000 mg | Freq: Four times a day (QID) | INTRAVENOUS | Status: AC
  Administered 2019-01-23: 1000 mg via INTRAVENOUS
  Filled 2019-01-23: qty 100

## 2019-01-23 NOTE — Progress Notes (Addendum)
1 Day Post-Op    CC: Abdominal pain  Subjective: She looks pretty good this morning.  NG was not working I irrigated it and got working again.  Midline incision looks fine.  Few hypoactive bowel sounds.  No flatus.  Objective: Vital signs in last 24 hours: Temp:  [97.8 F (36.6 C)-99.1 F (37.3 C)] 99.1 F (37.3 C) (07/28 0530) Pulse Rate:  [95-104] 95 (07/28 0530) Resp:  [13-20] 18 (07/28 0530) BP: (125-133)/(79-87) 127/85 (07/28 0530) SpO2:  [93 %-97 %] 97 % (07/28 0530) Last BM Date: 01/17/19 4525 IV 2650 urine 300 NG Afebrile vital signs are stable NA is 146 potassium 3.9 creatinine 1.07 WBC 9.8 H/H 9.9/31.2  Intake/Output from previous day: 07/27 0701 - 07/28 0700 In: 4525.7 [I.V.:3240.6; IV Piggyback:1285.1] Out: 3700 [Urine:2650; Emesis/NG output:300; Blood:100] Intake/Output this shift: No intake/output data recorded.  General appearance: alert, cooperative and no distress Resp: clear to auscultation bilaterally GI: Midline incision looks fine.  A few hypoactive bowel sounds.  No flatus.  Lab Results:  Recent Labs    01/22/19 0415 01/23/19 0329  WBC 5.5 9.8  HGB 11.1* 9.9*  HCT 35.4* 31.2*  PLT 396 351    BMET Recent Labs    01/22/19 0415 01/23/19 0329  NA 149* 146*  K 3.4* 3.9  CL 112* 115*  CO2 24 23  GLUCOSE 120* 131*  BUN 5* 9  CREATININE 0.89 1.07*  CALCIUM 10.1 9.5   PT/INR No results for input(s): LABPROT, INR in the last 72 hours.  Recent Labs  Lab 01/19/19 1046 01/20/19 0223  AST 13* 12*  ALT 12 12  ALKPHOS 87 78  BILITOT 1.7* 1.2  PROT 6.8 6.8  ALBUMIN 3.9 3.6     Lipase     Component Value Date/Time   LIPASE 31 01/19/2019 1046     Medications: . enoxaparin (LOVENOX) injection  40 mg Subcutaneous Q24H   . acetaminophen Stopped (01/23/19 0549)  . methocarbamol (ROBAXIN) IV 100 mL/hr at 01/23/19 0388   Anti-infectives (From admission, onward)   Start     Dose/Rate Route Frequency Ordered Stop   01/22/19 0845   ciprofloxacin (CIPRO) IVPB 400 mg     400 mg 200 mL/hr over 60 Minutes Intravenous  Once 01/22/19 0841 01/22/19 1310   01/22/19 0845  metroNIDAZOLE (FLAGYL) IVPB 1,000 mg 200 mL     1,000 mg 200 mL/hr over 60 Minutes Intravenous  Once 01/22/19 0841 01/22/19 1313     Assessment/Plan Dehydration Hypertension Anxiety/depression Hyperlipidemia Hypernatremia/hyperchloremia -Na 146/ CL 115 COVID negative 01/19/2019  Small bowel obstruction Diagnostic laparoscopy converted to exploratory laparotomy, lysis of adhesions x1 hour, 01/22/2019 Dr. Greer Pickerel Findings: (numerous interloop adhesions; small bowel to chronic areas of obstruction x 2)   FEN: IV fluids/n.p.o. ID: Preop Cipro/Flagyl DVT: Lovenox Follow-up: Dr. Redmond Pulling POC: [name]   [phone]   Plan: We will DC the Foley.  Leave the NG and continue suction today.  We will begin to mobilize get her out of bed and ambulate some in the hallways.  Her creatinine is up some, sodium and chlorides are elevated.  Will defer to Dr. Wynelle Cleveland.   LOS: 4 days    Vernor Monnig 01/23/2019 [phone]

## 2019-01-23 NOTE — Progress Notes (Signed)
PROGRESS NOTE    [name]   LEX:[id number]  DOB: Oct 22, 1950  DOA: 01/19/2019 PCP: Gregor Hams, FNP   Brief Narrative:  [name] is a 68 y.o. female with medical history significant for gynecological abdominal surgeries x2, essential hypertension, chronic depression/anxiety/bipolar disorder, who presented to [facility] ED with complaints of severe abdominal pain associated with nausea and vomiting x4 days.  CT abdomen and pelvis with contrast revealed partial small bowel obstruction.    Subjective: Having abdominal discomfort which is controlled with Morphine. No nausea. No other symptoms.     Assessment & Plan:   Principal Problem:   SBO (small bowel obstruction)  - s/p open ex laparotomy and extensive lysis of adhesions- per general surgery, continue with NG tube today. -cont IVF  Active Problems: Dehydration, hypernatremia, hyperchloremia Elevation in Cr today - urine output improved after increasing IVF  - she continues to have a good urine output despite her rise in Cr today from 0.89 to 1.07 (which may be in response to surgery) - I switched her IVF to D5W on 7/27 due to rising sodium and chloride - cont D5W for now and cont to follow electrolytes and I and O  Hypokalemia yesterday - replaced adequately  Anemia - Hb dropped from 11.1 to 9.9- ? Due to acute illness and IVF hydration- check anemia panel  Burning eyes - possibly dry eyes- drops have helped    HTN (hypertension) - she takes Amlodipine at at home which is on hold- Labetalol ordered PRN    HLD (hyperlipidemia) - hold Lipitor  Anxiety/depression - holding Cymbalta and Lithium due to SBO- as soon as she is able to tolerate liquids, these need to be resumed   Time spent in minutes: 35 DVT prophylaxis: Lovenox Code Status: Full code Family Communication:  Disposition Plan: home when stable Consultants:   General surgery Procedures:   NG tube  Open ex lap Antimicrobials:   Anti-infectives (From admission, onward)   Start     Dose/Rate Route Frequency Ordered Stop   01/22/19 0845  ciprofloxacin (CIPRO) IVPB 400 mg     400 mg 200 mL/hr over 60 Minutes Intravenous  Once 01/22/19 0841 01/22/19 1310   01/22/19 0845  metroNIDAZOLE (FLAGYL) IVPB 1,000 mg 200 mL     1,000 mg 200 mL/hr over 60 Minutes Intravenous  Once 01/22/19 0841 01/22/19 1313       Objective: Vitals:   01/22/19 1641 01/22/19 1942 01/22/19 2140 01/23/19 0530  BP: 131/79  130/83 127/85  Pulse: (!) 101 98 95 95  Resp: 15  18 18   Temp:   98.2 F (36.8 C) 99.1 F (37.3 C)  TempSrc:   Oral Oral  SpO2: 95%  96% 97%  Weight:      Height:        Intake/Output Summary (Last 24 hours) at 01/23/2019 0845 Last data filed at 01/23/2019 0809 Gross per 24 hour  Intake 4525.73 ml  Output 4050 ml  Net 475.73 ml   Filed Weights   01/19/19 1042 01/19/19 1725  Weight: 83.9 kg 82.7 kg    Examination: General exam: Appears comfortable  HEENT: PERRLA, oral mucosa moist, no sclera icterus or thrush Respiratory system: Clear to auscultation. Respiratory effort normal. Cardiovascular system: S1 & S2 heard,  No murmurs  Gastrointestinal system: Abdomen soft, non-tender, non distended- dressing not removed- NG tube draining green fluid- positive bowel sounds   Central nervous system: Alert and oriented. No focal neurological deficits. Extremities: No cyanosis, clubbing  or edema Skin: No rashes or ulcers Psychiatry:  Mood & affect appropriate.     Data Reviewed: I have personally reviewed following labs and imaging studies  CBC: Recent Labs  Lab 01/19/19 1046 01/20/19 0223 01/22/19 0415 01/23/19 0329  WBC 6.1 4.1 5.5 9.8  NEUTROABS  --  2.7  --   --   HGB 12.2 11.2* 11.1* 9.9*  HCT 38.0 35.6* 35.4* 31.2*  MCV 90.5 91.8 93.7 92.6  PLT 458* 420* 396 376   Basic Metabolic Panel: Recent Labs  Lab 01/19/19 1046 01/20/19 0223 01/21/19 1054 01/22/19 0415 01/23/19 0329  NA 137 143 148*  149* 146*  K 3.7 3.6 3.7 3.4* 3.9  CL 107 110 115* 112* 115*  CO2 21* 25 25 24 23   GLUCOSE 125* 137* 116* 120* 131*  BUN 9 8 6* 5* 9  CREATININE 1.06* 0.97 0.84 0.89 1.07*  CALCIUM 10.3 10.0 9.8 10.1 9.5  MG  --  2.4  --  2.3  --   PHOS  --  3.5  --   --   --    GFR: Estimated Creatinine Clearance: 49.4 mL/min (A) (by C-G formula based on SCr of 1.07 mg/dL (H)). Liver Function Tests: Recent Labs  Lab 01/19/19 1046 01/20/19 0223  AST 13* 12*  ALT 12 12  ALKPHOS 87 78  BILITOT 1.7* 1.2  PROT 6.8 6.8  ALBUMIN 3.9 3.6   Recent Labs  Lab 01/19/19 1046  LIPASE 31   No results for input(s): AMMONIA in the last 168 hours. Coagulation Profile: No results for input(s): INR, PROTIME in the last 168 hours. Cardiac Enzymes: No results for input(s): CKTOTAL, CKMB, CKMBINDEX, TROPONINI in the last 168 hours. BNP (last 3 results) No results for input(s): PROBNP in the last 8760 hours. HbA1C: No results for input(s): HGBA1C in the last 72 hours. CBG: No results for input(s): GLUCAP in the last 168 hours. Lipid Profile: No results for input(s): CHOL, HDL, LDLCALC, TRIG, CHOLHDL, LDLDIRECT in the last 72 hours. Thyroid Function Tests: No results for input(s): TSH, T4TOTAL, FREET4, T3FREE, THYROIDAB in the last 72 hours. Anemia Panel: No results for input(s): VITAMINB12, FOLATE, FERRITIN, TIBC, IRON, RETICCTPCT in the last 72 hours. Urine analysis:    Component Value Date/Time   COLORURINE YELLOW 01/19/2019 1046   APPEARANCEUR CLEAR 01/19/2019 1046   LABSPEC 1.012 01/19/2019 1046   PHURINE 6.0 01/19/2019 1046   GLUCOSEU NEGATIVE 01/19/2019 1046   HGBUR NEGATIVE 01/19/2019 [place] 01/19/2019 1046   [place] 01/19/2019 1046   PROTEINUR 100 (A) 01/19/2019 1046   UROBILINOGEN 0.2 03/18/2011 2118   NITRITE NEGATIVE 01/19/2019 1046   LEUKOCYTESUR NEGATIVE 01/19/2019 1046   Sepsis Labs: @LABRCNTIP (procalcitonin:4,lacticidven:4) ) Recent Results  (from the past 240 hour(s))  SARS Coronavirus 2 (CEPHEID - Performed in [place] hospital lab), Hosp Order     Status: None   Collection Time: 01/19/19  2:47 PM   Specimen: Nasopharyngeal Swab  Result Value Ref Range Status   SARS Coronavirus 2 NEGATIVE NEGATIVE Final    Comment: (NOTE) If result is NEGATIVE SARS-CoV-2 target nucleic acids are NOT DETECTED. The SARS-CoV-2 RNA is generally detectable in upper and lower  respiratory specimens during the acute phase of infection. The lowest  concentration of SARS-CoV-2 viral copies this assay can detect is 250  copies / mL. A negative result does not preclude SARS-CoV-2 infection  and should not be used as the sole basis for treatment or other  patient management  decisions.  A negative result may occur with  improper specimen collection / handling, submission of specimen other  than nasopharyngeal swab, presence of viral mutation(s) within the  areas targeted by this assay, and inadequate number of viral copies  (<250 copies / mL). A negative result must be combined with clinical  observations, patient history, and epidemiological information. If result is POSITIVE SARS-CoV-2 target nucleic acids are DETECTED. The SARS-CoV-2 RNA is generally detectable in upper and lower  respiratory specimens dur ing the acute phase of infection.  Positive  results are indicative of active infection with SARS-CoV-2.  Clinical  correlation with patient history and other diagnostic information is  necessary to determine patient infection status.  Positive results do  not rule out bacterial infection or co-infection with other viruses. If result is PRESUMPTIVE POSTIVE SARS-CoV-2 nucleic acids MAY BE PRESENT.   A presumptive positive result was obtained on the submitted specimen  and confirmed on repeat testing.  While 2019 novel coronavirus  (SARS-CoV-2) nucleic acids may be present in the submitted sample  additional confirmatory testing may be  necessary for epidemiological  and / or clinical management purposes  to differentiate between  SARS-CoV-2 and other Sarbecovirus currently known to infect humans.  If clinically indicated additional testing with an alternate test  methodology [phone]) is advised. The SARS-CoV-2 RNA is generally  detectable in upper and lower respiratory sp ecimens during the acute  phase of infection. The expected result is Negative. Fact Sheet for Patients:  StrictlyIdeas.no Fact Sheet for Healthcare Providers: BankingDealers.co.za This test is not yet approved or cleared by the [place] FDA and has been authorized for detection and/or diagnosis of SARS-CoV-2 by FDA under an Emergency Use Authorization (EUA).  This EUA will remain in effect (meaning this test can be used) for the duration of the COVID-19 declaration under Section 564(b)(1) of the Act, 21 U.S.C. section 360bbb-3(b)(1), unless the authorization is terminated or revoked sooner. Performed at [facility], [place] [address] [place], [place] [address]   Surgical pcr screen     Status: None   Collection Time: 01/22/19  9:05 AM   Specimen: Nasal Mucosa; Nasal Swab  Result Value Ref Range Status   MRSA, PCR NEGATIVE NEGATIVE Final   Staphylococcus aureus NEGATIVE NEGATIVE Final    Comment: (NOTE) The Xpert SA Assay (FDA approved for NASAL specimens in patients 73 years of age and older), is one component of a comprehensive surveillance program. It is not intended to diagnose infection nor to guide or monitor treatment. Performed at [facility], [place] [address], [place], [place] [address]          Radiology Studies: Dg Abd Portable 1v  Result Date: 01/22/2019 CLINICAL DATA:  Abdominal pain. EXAM: PORTABLE ABDOMEN - 1 VIEW COMPARISON:  01/20/2019; 01/19/2019; CT abdomen pelvis-01/19/2019 FINDINGS: Enteric tube tip and side port projected the expected location of the  gastric antrum. There is persistent gaseous distention of several loops of small bowel with index loop of small bowel in the left mid hemiabdomen measuring approximately 4.4 cm in diameter, grossly unchanged compared to the 01/20/2019 examination, previously, index loop measures 4.1 cm. A small amount of enteric contrast is seen within the cecum and ascending colon however there is an otherwise conspicuous paucity of distal colonic gas. No supine evidence of pneumoperitoneum. No pneumatosis or portal venous gas. Punctate phleboliths overlie the lower pelvis bilaterally. Limited visualization of lower thorax demonstrates minimal left basilar/retrocardiac heterogeneous opacities favored to represent atelectasis.  No acute osseous abnormalities. Mild scoliotic curvature of the thoracolumbar spine with associated mild to moderate multilevel DDD, incompletely evaluated. IMPRESSION: Similar findings worrisome for small bowel obstruction. Electronically Signed   By: Sandi Mariscal M.D.   On: 01/22/2019 07:34      Scheduled Meds: . enoxaparin (LOVENOX) injection  40 mg Subcutaneous Q24H   Continuous Infusions: . acetaminophen    . methocarbamol (ROBAXIN) IV 100 mL/hr at 01/23/19 0643     LOS: 4 days      Debbe Odea, MD [facility] Pager: [email or web address] Password [facility] 01/23/2019, 8:45 AM

## 2019-01-24 DIAGNOSIS — D649 Anemia, unspecified: Secondary | ICD-10-CM

## 2019-01-24 DIAGNOSIS — E878 Other disorders of electrolyte and fluid balance, not elsewhere classified: Secondary | ICD-10-CM

## 2019-01-24 DIAGNOSIS — E860 Dehydration: Secondary | ICD-10-CM

## 2019-01-24 DIAGNOSIS — E870 Hyperosmolality and hypernatremia: Secondary | ICD-10-CM

## 2019-01-24 DIAGNOSIS — R7303 Prediabetes: Secondary | ICD-10-CM

## 2019-01-24 DIAGNOSIS — N289 Disorder of kidney and ureter, unspecified: Secondary | ICD-10-CM

## 2019-01-24 DIAGNOSIS — E669 Obesity, unspecified: Secondary | ICD-10-CM

## 2019-01-24 LAB — BASIC METABOLIC PANEL
Anion gap: 9 (ref 5–15)
BUN: 8 mg/dL (ref 8–23)
CO2: 22 mmol/L (ref 22–32)
Calcium: 9.9 mg/dL (ref 8.9–10.3)
Chloride: 115 mmol/L — ABNORMAL HIGH (ref 98–111)
Creatinine, Ser: 1.01 mg/dL — ABNORMAL HIGH (ref 0.44–1.00)
GFR calc Af Amer: >60 mL/min (ref >60)
GFR calc non Af Amer: 57 mL/min — ABNORMAL LOW (ref >60)
Glucose, Bld: 131 mg/dL — ABNORMAL HIGH (ref 70–99)
Potassium: 3.2 mmol/L — ABNORMAL LOW (ref 3.5–5.1)
Sodium: 146 mmol/L — ABNORMAL HIGH (ref 135–145)

## 2019-01-24 LAB — FOLATE: Folate: 5.2 ng/mL — ABNORMAL LOW (ref >5.9)

## 2019-01-24 LAB — CBC
HCT: 32.4 % — ABNORMAL LOW (ref 36.0–46.0)
Hemoglobin: 10.2 g/dL — ABNORMAL LOW (ref 12.0–15.0)
MCH: 28.7 pg (ref 26.0–34.0)
MCHC: 31.5 g/dL (ref 30.0–36.0)
MCV: 91.3 fL (ref 80.0–100.0)
Platelets: 392 10*3/uL (ref 150–400)
RBC: 3.55 MIL/uL — ABNORMAL LOW (ref 3.87–5.11)
RDW: 17.6 % — ABNORMAL HIGH (ref 11.5–15.5)
WBC: 9.4 10*3/uL (ref 4.0–10.5)
nRBC: 0 % (ref 0.0–0.2)

## 2019-01-24 LAB — VITAMIN B12: Vitamin B-12: 188 pg/mL (ref 180–914)

## 2019-01-24 LAB — MAGNESIUM: Magnesium: 2.2 mg/dL (ref 1.7–2.4)

## 2019-01-24 MED ORDER — ACETAMINOPHEN 10 MG/ML IV SOLN
1000.0000 mg | Freq: Four times a day (QID) | INTRAVENOUS | Status: AC
  Administered 2019-01-24: 1000 mg via INTRAVENOUS
  Filled 2019-01-24: qty 100

## 2019-01-24 MED ORDER — DULOXETINE HCL 60 MG PO CPEP
60.0000 mg | ORAL_CAPSULE | Freq: Every day | ORAL | Status: DC
  Administered 2019-01-25 – 2019-01-29 (×5): 60 mg via ORAL
  Filled 2019-01-24 (×5): qty 1

## 2019-01-24 MED ORDER — POTASSIUM CHLORIDE 2 MEQ/ML IV SOLN
INTRAVENOUS | Status: DC
  Administered 2019-01-24 – 2019-01-26 (×4): via INTRAVENOUS
  Filled 2019-01-24 (×5): qty 1000

## 2019-01-24 MED ORDER — POTASSIUM CHLORIDE 10 MEQ/100ML IV SOLN
10.0000 meq | INTRAVENOUS | Status: AC
  Administered 2019-01-24 (×4): 10 meq via INTRAVENOUS
  Filled 2019-01-24 (×4): qty 100

## 2019-01-24 MED ORDER — DULOXETINE HCL 60 MG PO CPEP: 60.0000 mg | ORAL_CAPSULE | Freq: Every day | ORAL | Status: DC

## 2019-01-24 NOTE — Progress Notes (Signed)
PROGRESS NOTE    LAYLAMARIE MEUSER  OZH:[id number] DOB: 02/22/1951 DOA: 01/19/2019 PCP: Gregor Hams, FNP  Brief Narrative:  [name] is a 68 y.o. AAfemalewith medical history significant for but not limited togynecological abdominal surgeries x2, essential hypertension, chronic depression/anxiety/bipolar disorder as well as others who presented to [facility] ED with complaints of severe abdominal pain associated with nausea and vomiting x4 days. CT abdomen and pelvis with contrast revealed partial small bowel obstruction.  She underwent a diagnostic laparoscopic and converted to exploratory laparotomy as well as adhesional lysis for least 1 hour.  She is postoperative day 2 and now has a postoperative ileus.  Currently she is mobilizing but still not passing any flatus or having a bowel movement.  General Surgery is trying a clamping trial today.  Assessment & Plan:   Principal Problem:   SBO (small bowel obstruction) (HCC) Active Problems:   HTN (hypertension)   HLD (hyperlipidemia)   Prediabetes   Normocytic anemia   Dehydration   Hypernatremia   Hyperchloremia   Renal insufficiency   Obesity (BMI 30-39.9)  SBO (small bowel obstruction)  -s/p open ex laparotomy and extensive lysis of adhesions -Pper general surgery, continue with NG tube today and they are trying Clamping Trial and possible NG tube removal today -Ambulate and IS -Cont IVF with D5W + 40 mEQ KCl at 100 mL/hr and continue with n.p.o. -Replace potassium to a level 4 and replace magnesium to a level of 2 ideally -Per general surgery continue with IV Acetaminophen  Dehydration, Hypernatremia, Hyperchloremia Elevation in Cr/Renal Insuffiencey  -Urine output improved after increasing IVF  -Na+ is trending down and went from 149 -> 146 -Chloride is still 115 -She continues to have a good urine output despite her rise in Cr from 0.89 to 1.07 (which may be in response to surgery; Now Cr is improving and is  1.01 -Her IVF were switched to D5W on 7/27 due to rising sodium and chloride -Cont D5W for now and cont to follow electrolytes and I and O  Normocytic Anemia -Hb dropped from 11.1 to 9.9 and today is 10.2 -? Due to acute illness and IVF hydration -Checked Anemia Panel and showed iron level of 13, U IBC of 284, TIBC of 297, saturation ratios of 4%, ferritin of 56, folate level 5.2, vitamin B12 level of 188 -Continue to monitor for signs and symptoms of bleeding; currently no overt bleeding noted -Repeat CBC in a.m.  Burning Eyes -Possibly dry eyes -Naphazoline-Glycerin 1-2 Drops Both Eyes have helped  Hyperglycemia the setting of Prediabetes -Likely in the setting of D5W but blood sugars have ranged from 116-137 on daily BMPs/CMP's -Recent Hemoglobin A1c was 6.3 on 01/19/2019 -Continue monitor blood sugars carefully and if necessary will need to place on sensitive along sliding scale AC  HTN (Hypertension) -She takes Amlodipine at at home which is on hold until she tolerates po -C/w Labetalol 10 mg q6hprn for SBP>170  HLD (Hyperlipidemia) -Continue to hold Lipitor until tolerating po  Hypokalemia -Patient's K+ this AM was 3.2 -Replete with IV KCl 40 mEQ; also has D5W + 40 mEQ KCl at 100 mL/hr -Mag level was 2.2 -Continue to Monitor and Replete as Necessary -Repeat CMP in AM   Obesity -Estimated body mass index is 33.35 kg/m as calculated from the following:   Height as of this encounter: 5\' 2"  (1.575 m).   Weight as of this encounter: 82.7 kg. -Weight Loss and Dietary Counseling given   DVT prophylaxis: Enoxaparin  40 mg q24h Code Status: FULL CODE Family Communication: No family present at bedside  Disposition Plan: Pending Improvement of Ileus and tolerance of Diet and per Surgical Clearance   Consultants:   General Surgery   Procedures:  Diagnostic laparoscopy converted to exploratory laparotomy, adhesiolysis for one hour done by Dr. Redmond Pulling on 01/22/2019   Antimicrobials:  Anti-infectives (From admission, onward)   Start     Dose/Rate Route Frequency Ordered Stop   01/22/19 0845  ciprofloxacin (CIPRO) IVPB 400 mg     400 mg 200 mL/hr over 60 Minutes Intravenous  Once 01/22/19 0841 01/22/19 1310   01/22/19 0845  metroNIDAZOLE (FLAGYL) IVPB 1,000 mg 200 mL     1,000 mg 200 mL/hr over 60 Minutes Intravenous  Once 01/22/19 0841 01/22/19 1313     Subjective: Patient was seen and examined at bedside and she denied any complaints but was very frustrated and wanting to go home.  Denied chest pain, lightheadedness and states pain is adequately controlled currently.  Has not had a bowel movement and not passing any flatus.  No other concerns or complaints at this time.  Objective: Vitals:   01/23/19 2043 01/23/19 2144 01/24/19 0429 01/24/19 1329  BP: (!) 145/89  (!) 139/94 120/88  Pulse: (!) 111 (!) 107 (!) 108 (!) 105  Resp: 18  16 14   Temp: 100.2 F (37.9 C)  98.3 F (36.8 C) 98.8 F (37.1 C)  TempSrc: Oral  Oral Oral  SpO2: 100% 98% 98% 98%  Weight:      Height:        Intake/Output Summary (Last 24 hours) at 01/24/2019 1339 Last data filed at 01/24/2019 1030 Gross per 24 hour  Intake 2446.28 ml  Output 3875 ml  Net -1428.72 ml   Filed Weights   01/19/19 1042 01/19/19 1725  Weight: 83.9 kg 82.7 kg   Examination: Physical Exam:  Constitutional: WN/WD obese AAF in NAD and appears frustrated Eyes: Lids and conjunctivae normal, sclerae anicteric  ENMT: External Ears, Nose appear normal. Grossly normal hearing.  Neck: Appears normal, supple, no cervical masses, normal ROM, no appreciable thyromegaly; no JVD Respiratory: Diminished to auscultation bilaterally, no wheezing, rales, rhonchi or crackles. Normal respiratory effort and patient is not tachypenic. No accessory muscle use.  Cardiovascular: RRR, no murmurs / rubs / gallops. S1 and S2 auscultated. Trace extremity edema.  Abdomen: Soft mildly tender, Distended 2/2 body habitus.  Abdominal Dressing appeared C/D/I. Bowel sounds diminished.  GU: Deferred. Musculoskeletal: No clubbing / cyanosis of digits/nails. No joint deformity upper and lower extremities. Skin: No rashes, lesions, ulcers on a limited skin evaluation. No induration; Warm and dry.  Neurologic: CN 2-12 grossly intact with no focal deficits. Romberg sign and cerebellar reflexes not assessed.  Psychiatric: Normal judgment and insight. Alert and oriented x 3. Anxious and frustrated mood and appropriate affect.   Data Reviewed: I have personally reviewed following labs and imaging studies  CBC: Recent Labs  Lab 01/19/19 1046 01/20/19 0223 01/22/19 0415 01/23/19 0329 01/24/19 0142  WBC 6.1 4.1 5.5 9.8 9.4  NEUTROABS  --  2.7  --   --   --   HGB 12.2 11.2* 11.1* 9.9* 10.2*  HCT 38.0 35.6* 35.4* 31.2* 32.4*  MCV 90.5 91.8 93.7 92.6 91.3  PLT 458* 420* 396 351 314   Basic Metabolic Panel: Recent Labs  Lab 01/20/19 0223 01/21/19 1054 01/22/19 0415 01/23/19 0329 01/24/19 0142  NA 143 148* 149* 146* 146*  K 3.6 3.7 3.4* 3.9 3.2*  CL 110 115* 112* 115* 115*  CO2 25 25 24 23 22   GLUCOSE 137* 116* 120* 131* 131*  BUN 8 6* 5* 9 8  CREATININE 0.97 0.84 0.89 1.07* 1.01*  CALCIUM 10.0 9.8 10.1 9.5 9.9  MG 2.4  --  2.3  --  2.2  PHOS 3.5  --   --   --   --    GFR: Estimated Creatinine Clearance: 52.4 mL/min (A) (by C-G formula based on SCr of 1.01 mg/dL (H)). Liver Function Tests: Recent Labs  Lab 01/19/19 1046 01/20/19 0223  AST 13* 12*  ALT 12 12  ALKPHOS 87 78  BILITOT 1.7* 1.2  PROT 6.8 6.8  ALBUMIN 3.9 3.6   Recent Labs  Lab 01/19/19 1046  LIPASE 31   No results for input(s): AMMONIA in the last 168 hours. Coagulation Profile: No results for input(s): INR, PROTIME in the last 168 hours. Cardiac Enzymes: No results for input(s): CKTOTAL, CKMB, CKMBINDEX, TROPONINI in the last 168 hours. BNP (last 3 results) No results for input(s): PROBNP in the last 8760 hours. HbA1C: No  results for input(s): HGBA1C in the last 72 hours. CBG: No results for input(s): GLUCAP in the last 168 hours. Lipid Profile: No results for input(s): CHOL, HDL, LDLCALC, TRIG, CHOLHDL, LDLDIRECT in the last 72 hours. Thyroid Function Tests: No results for input(s): TSH, T4TOTAL, FREET4, T3FREE, THYROIDAB in the last 72 hours. Anemia Panel: Recent Labs    01/23/19 1012 01/24/19 0142  VITAMINB12  --  188  FOLATE  --  5.2*  FERRITIN 56  --   TIBC 297  --   IRON 13*  --   RETICCTPCT 1.9  --    Sepsis Labs: No results for input(s): PROCALCITON, LATICACIDVEN in the last 168 hours.  Recent Results (from the past 240 hour(s))  SARS Coronavirus 2 (CEPHEID - Performed in [place] hospital lab), Hosp Order     Status: None   Collection Time: 01/19/19  2:47 PM   Specimen: Nasopharyngeal Swab  Result Value Ref Range Status   SARS Coronavirus 2 NEGATIVE NEGATIVE Final    Comment: (NOTE) If result is NEGATIVE SARS-CoV-2 target nucleic acids are NOT DETECTED. The SARS-CoV-2 RNA is generally detectable in upper and lower  respiratory specimens during the acute phase of infection. The lowest  concentration of SARS-CoV-2 viral copies this assay can detect is 250  copies / mL. A negative result does not preclude SARS-CoV-2 infection  and should not be used as the sole basis for treatment or other  patient management decisions.  A negative result may occur with  improper specimen collection / handling, submission of specimen other  than nasopharyngeal swab, presence of viral mutation(s) within the  areas targeted by this assay, and inadequate number of viral copies  (<250 copies / mL). A negative result must be combined with clinical  observations, patient history, and epidemiological information. If result is POSITIVE SARS-CoV-2 target nucleic acids are DETECTED. The SARS-CoV-2 RNA is generally detectable in upper and lower  respiratory specimens dur ing the acute phase of infection.   Positive  results are indicative of active infection with SARS-CoV-2.  Clinical  correlation with patient history and other diagnostic information is  necessary to determine patient infection status.  Positive results do  not rule out bacterial infection or co-infection with other viruses. If result is PRESUMPTIVE POSTIVE SARS-CoV-2 nucleic acids MAY BE PRESENT.   A presumptive positive result was obtained on the submitted specimen  and confirmed  on repeat testing.  While 2019 novel coronavirus  (SARS-CoV-2) nucleic acids may be present in the submitted sample  additional confirmatory testing may be necessary for epidemiological  and / or clinical management purposes  to differentiate between  SARS-CoV-2 and other Sarbecovirus currently known to infect humans.  If clinically indicated additional testing with an alternate test  methodology [phone]) is advised. The SARS-CoV-2 RNA is generally  detectable in upper and lower respiratory sp ecimens during the acute  phase of infection. The expected result is Negative. Fact Sheet for Patients:  StrictlyIdeas.no Fact Sheet for Healthcare Providers: BankingDealers.co.za This test is not yet approved or cleared by the [place] FDA and has been authorized for detection and/or diagnosis of SARS-CoV-2 by FDA under an Emergency Use Authorization (EUA).  This EUA will remain in effect (meaning this test can be used) for the duration of the COVID-19 declaration under Section 564(b)(1) of the Act, 21 U.S.C. section 360bbb-3(b)(1), unless the authorization is terminated or revoked sooner. Performed at [facility], [place] [address], [place], [place] [address]   Surgical pcr screen     Status: None   Collection Time: 01/22/19  9:05 AM   Specimen: Nasal Mucosa; Nasal Swab  Result Value Ref Range Status   MRSA, PCR NEGATIVE NEGATIVE Final   Staphylococcus aureus NEGATIVE NEGATIVE Final     Comment: (NOTE) The Xpert SA Assay (FDA approved for NASAL specimens in patients 51 years of age and older), is one component of a comprehensive surveillance program. It is not intended to diagnose infection nor to guide or monitor treatment. Performed at [facility], [place] [address] [place], [place] [address]     Radiology Studies: No results found.  Scheduled Meds: . enoxaparin (LOVENOX) injection  40 mg Subcutaneous Q24H   Continuous Infusions: . dextrose 5 % with kcl 100 mL/hr at 01/24/19 1151  . methocarbamol (ROBAXIN) IV Stopped (01/24/19 0630)  . potassium chloride 10 mEq (01/24/19 1311)    LOS: 5 days   Kerney Elbe, DO [facility] PAGER is on [place]  If 7PM-7AM, please contact night-coverage [email or web address] Password [email or web address] 01/24/2019, 1:39 PM

## 2019-01-24 NOTE — Progress Notes (Addendum)
2 Days Post-Op    CC: Abdominal pain  Subjective: No flatus so far bowel sounds are still hypoactive.  Midline incision looks good.  Few rales at her bases.  Asking when she can go home.  Objective: Vital signs in last 24 hours: Temp:  [98.3 F (36.8 C)-100.2 F (37.9 C)] 98.3 F (36.8 C) (07/29 0429) Pulse Rate:  [96-111] 108 (07/29 0429) Resp:  [16-18] 16 (07/29 0429) BP: (139-148)/(89-94) 139/94 (07/29 0429) SpO2:  [98 %-100 %] 98 % (07/29 0429) Last BM Date: 01/17/19 2206 IV 4075 urine 1250 NG (350 last shift) TM 100.2 BP up some K+ 3.2, Na 146, Cl 115 CBC stable Intake/Output from previous day: 07/28 0701 - 07/29 0700 In: 2206.3 [I.V.:1942.2; IV Piggyback:264] Out: 5325 [Urine:4075; Emesis/NG output:1250] Intake/Output this shift: No intake/output data recorded.  General appearance: alert, cooperative and no distress Resp: Few rales in the bases. GI: Still mildly distended.  Not a lot out of her NG last shift.  She is taking ice chips.  No flatus or BM so far.  Bowel sounds are hypoactive.  Lab Results:  Recent Labs    01/23/19 0329 01/24/19 0142  WBC 9.8 9.4  HGB 9.9* 10.2*  HCT 31.2* 32.4*  PLT 351 392    BMET Recent Labs    01/23/19 0329 01/24/19 0142  NA 146* 146*  K 3.9 3.2*  CL 115* 115*  CO2 23 22  GLUCOSE 131* 131*  BUN 9 8  CREATININE 1.07* 1.01*  CALCIUM 9.5 9.9   PT/INR No results for input(s): LABPROT, INR in the last 72 hours.  Recent Labs  Lab 01/19/19 1046 01/20/19 0223  AST 13* 12*  ALT 12 12  ALKPHOS 87 78  BILITOT 1.7* 1.2  PROT 6.8 6.8  ALBUMIN 3.9 3.6     Lipase     Component Value Date/Time   LIPASE 31 01/19/2019 1046     Medications: . enoxaparin (LOVENOX) injection  40 mg Subcutaneous Q24H   . dextrose 110 mL/hr at 01/24/19 0657  . methocarbamol (ROBAXIN) IV Stopped (01/24/19 0630)    Assessment/Plan  Dehydration Hypertension Anxiety/depression Hyperlipidemia Hypernatremia/hyperchloremia -Na  146/ CL 115 COVID negative 01/19/2019  Small bowel obstruction Diagnostic laparoscopy converted to exploratory laparotomy, lysis of adhesions x1 hour, 01/22/2019 Dr. Greer Pickerel Findings: (numerous interloop adhesions; small bowel to chronic areas of obstruction x 2)   FEN: IV fluids/n.p.o. ID: Preop Cipro/Flagyl DVT: Lovenox Follow-up: Dr. Redmond Pulling POC: [place], [place] D Spouse   [phone]    Plan: I told her she needed to be up; out of bed and walking more.  Needs to be working on her incentive more frequently.  We need to get her potassium up to around 4.0.  I will add a magnesium and place potassium in her IV fluids.  We will try some clamping trials today and see how she does, check on her later today and see if the NG tube can come out. Continue IV Tylenol today.   Mag 2.2  40 meq K= added to IV fluids.    LOS: 5 days    [name] 01/24/2019 [phone]

## 2019-01-24 NOTE — Anesthesia Postprocedure Evaluation (Signed)
Anesthesia Post Note  Patient: [name]  Procedure(s) Performed: LAPAROSCOPY DIAGNOSTIC (N/A Abdomen) EXPLORATORY LAPAROTOMY (N/A Abdomen) Lysis Of  Adhesions x 1 hour (Abdomen)     Patient location during evaluation: PACU Anesthesia Type: General Level of consciousness: awake and alert Pain management: pain level controlled Vital Signs Assessment: post-procedure vital signs reviewed and stable Respiratory status: spontaneous breathing, nonlabored ventilation, respiratory function stable and patient connected to nasal cannula oxygen Cardiovascular status: blood pressure returned to baseline and stable Postop Assessment: no apparent nausea or vomiting Anesthetic complications: no    Last Vitals:  Vitals:   01/23/19 2144 01/24/19 0429  BP:  (!) 139/94  Pulse: (!) 107 (!) 108  Resp:  16  Temp:  36.8 C  SpO2: 98% 98%    Last Pain:  Vitals:   01/24/19 0731  TempSrc:   PainSc: 5                  Jesselle Laflamme

## 2019-01-25 DIAGNOSIS — E878 Other disorders of electrolyte and fluid balance, not elsewhere classified: Secondary | ICD-10-CM

## 2019-01-25 LAB — CBC WITH DIFFERENTIAL/PLATELET
Abs Immature Granulocytes: 0.04 10*3/uL (ref 0.00–0.07)
Basophils Absolute: 0 10*3/uL (ref 0.0–0.1)
Basophils Relative: 0 %
Eosinophils Absolute: 0.3 10*3/uL (ref 0.0–0.5)
Eosinophils Relative: 4 %
HCT: 34.1 % — ABNORMAL LOW (ref 36.0–46.0)
Hemoglobin: 10.7 g/dL — ABNORMAL LOW (ref 12.0–15.0)
Immature Granulocytes: 1 %
Lymphocytes Relative: 16 %
Lymphs Abs: 1.3 10*3/uL (ref 0.7–4.0)
MCH: 28.8 pg (ref 26.0–34.0)
MCHC: 31.4 g/dL (ref 30.0–36.0)
MCV: 91.9 fL (ref 80.0–100.0)
Monocytes Absolute: 0.6 10*3/uL (ref 0.1–1.0)
Monocytes Relative: 7 %
Neutro Abs: 6.1 10*3/uL (ref 1.7–7.7)
Neutrophils Relative %: 72 %
Platelets: 380 10*3/uL (ref 150–400)
RBC: 3.71 MIL/uL — ABNORMAL LOW (ref 3.87–5.11)
RDW: 17.3 % — ABNORMAL HIGH (ref 11.5–15.5)
WBC: 8.3 10*3/uL (ref 4.0–10.5)
nRBC: 0 % (ref 0.0–0.2)

## 2019-01-25 LAB — COMPREHENSIVE METABOLIC PANEL
ALT: 13 U/L (ref 0–44)
AST: 13 U/L — ABNORMAL LOW (ref 15–41)
Albumin: 3.4 g/dL — ABNORMAL LOW (ref 3.5–5.0)
Alkaline Phosphatase: 65 U/L (ref 38–126)
Anion gap: 9 (ref 5–15)
BUN: 8 mg/dL (ref 8–23)
CO2: 20 mmol/L — ABNORMAL LOW (ref 22–32)
Calcium: 9.5 mg/dL (ref 8.9–10.3)
Chloride: 112 mmol/L — ABNORMAL HIGH (ref 98–111)
Creatinine, Ser: 1.01 mg/dL — ABNORMAL HIGH (ref 0.44–1.00)
GFR calc Af Amer: >60 mL/min (ref >60)
GFR calc non Af Amer: 57 mL/min — ABNORMAL LOW (ref >60)
Glucose, Bld: 122 mg/dL — ABNORMAL HIGH (ref 70–99)
Potassium: 3.5 mmol/L (ref 3.5–5.1)
Sodium: 141 mmol/L (ref 135–145)
Total Bilirubin: 0.9 mg/dL (ref 0.3–1.2)
Total Protein: 6.2 g/dL — ABNORMAL LOW (ref 6.5–8.1)

## 2019-01-25 LAB — MAGNESIUM: Magnesium: 2.1 mg/dL (ref 1.7–2.4)

## 2019-01-25 LAB — PHOSPHORUS: Phosphorus: 3.3 mg/dL (ref 2.5–4.6)

## 2019-01-25 MED ORDER — POTASSIUM CHLORIDE 10 MEQ/100ML IV SOLN
10.0000 meq | INTRAVENOUS | Status: AC
  Administered 2019-01-25 (×4): 10 meq via INTRAVENOUS
  Filled 2019-01-25 (×4): qty 100

## 2019-01-25 MED ORDER — LITHIUM CARBONATE 300 MG PO CAPS
600.0000 mg | ORAL_CAPSULE | Freq: Every day | ORAL | Status: DC
  Administered 2019-01-25 – 2019-01-29 (×5): 600 mg via ORAL
  Filled 2019-01-25 (×5): qty 2

## 2019-01-25 MED ORDER — POTASSIUM CHLORIDE CRYS ER 20 MEQ PO TBCR
20.0000 meq | EXTENDED_RELEASE_TABLET | Freq: Two times a day (BID) | ORAL | Status: DC
  Administered 2019-01-25 – 2019-01-29 (×9): 20 meq via ORAL
  Filled 2019-01-25 (×9): qty 1

## 2019-01-25 NOTE — Progress Notes (Addendum)
3 Days Post-Op    CC: Abdominal pain  Subjective: Patient's feeling better today no issues with the NG out.  Her abdomen still feels rather distended bowel sounds are hypoactive and no flatus so far.  Midline incision looks fine.  Objective: Vital signs in last 24 hours: Temp:  [98.6 F (37 C)-98.8 F (37.1 C)] 98.7 F (37.1 C) (07/30 0612) Pulse Rate:  [105-110] 107 (07/30 0612) Resp:  [14-16] 16 (07/30 0612) BP: (115-124)/(78-97) 115/78 (07/30 0612) SpO2:  [98 %-99 %] 98 % (07/30 0612) Last BM Date: 01/21/19 702 PO clears  600 IV Voided x 2 NG out NO Bm TM 100.2 - 2 days ago, afebrile since , HR still up 100 range K+ 3.5, creatinine 1.01, mag 2.1 Cbc stable Intake/Output from previous day: 07/29 0701 - 07/30 0700 In: 1374.8 [P.O.:702; I.V.:337.8; IV Piggyback:335] Out: 0  Intake/Output this shift: No intake/output data recorded.  General appearance: alert, cooperative and no distress  Abdomen: She slightly distended midline incision is fine.  She has some bowel sounds but they are hypoactive.  Lab Results:  Recent Labs    01/24/19 0142 01/25/19 0210  WBC 9.4 8.3  HGB 10.2* 10.7*  HCT 32.4* 34.1*  PLT 392 380    BMET Recent Labs    01/24/19 0142 01/25/19 0210  NA 146* 141  K 3.2* 3.5  CL 115* 112*  CO2 22 20*  GLUCOSE 131* 122*  BUN 8 8  CREATININE 1.01* 1.01*  CALCIUM 9.9 9.5   PT/INR No results for input(s): LABPROT, INR in the last 72 hours.  Recent Labs  Lab 01/19/19 1046 01/20/19 0223 01/25/19 0210  AST 13* 12* 13*  ALT 12 12 13   ALKPHOS 87 78 65  BILITOT 1.7* 1.2 0.9  PROT 6.8 6.8 6.2*  ALBUMIN 3.9 3.6 3.4*     Lipase     Component Value Date/Time   LIPASE 31 01/19/2019 1046     Medications: . DULoxetine  60 mg Oral Daily  . enoxaparin (LOVENOX) injection  40 mg Subcutaneous Q24H    Assessment/Plan Dehydration Hypertension Anxiety/depression/Bipolar hx Hyperlipidemia Hypernatremia/hyperchloremia -  improved Hypokakemia - replacing K+ COVIDnegative7/24/2020  Small bowel obstruction Diagnostic laparoscopy converted to exploratory laparotomy, lysis of adhesions x1 hour, 01/22/2019 Dr. Greer Pickerel  POD# 3 Findings: (numerous interloop adhesions;small bowel to chronic areas of obstruction x 2)  FEN: IV fluids/NG out last PM >>sips and chips last PM ID: Preop Cipro/Flagyl DVT: Lovenox Follow-up: Dr. Redmond Pulling POC: [name]   [phone]    Plan: We will put her on clear liquids and I told her to walk as much as possible today.  She has any nausea or increased abdominal discomfort to back off on the clear liquids.  Avoid some additional oral potassium, and restarted her lithium.         LOS: 6 days    Kasey Ewings 01/25/2019 [phone]

## 2019-01-25 NOTE — Progress Notes (Signed)
PROGRESS NOTE    [name]  EGB:[id number] DOB: 10/16/1950 DOA: 01/19/2019 PCP: Gregor Hams, FNP  Brief Narrative:  [name] is a 68 y.o. AAfemalewith medical history significant for but not limited togynecological abdominal surgeries x2, essential hypertension, chronic depression/anxiety/bipolar disorder as well as others who presented to [facility] ED with complaints of severe abdominal pain associated with nausea and vomiting x4 days. CT abdomen and pelvis with contrast revealed partial small bowel obstruction.  She underwent a diagnostic laparoscopic and converted to exploratory laparotomy as well as adhesional lysis for least 1 hour.  She is postoperative day 3 and now has a postoperative ileus.  Currently she is mobilizing but still not passing any flatus or having a bowel movement.  General Surgery tried a clamping trial and removed the NG tube yesterday.  She was started on a clear liquid diet and continues to mobilize.  She denies any increased nausea or vomiting but she does though back off on clear liquids and her oral medications have been restarted.  Assessment & Plan:   Principal Problem:   SBO (small bowel obstruction) (HCC) Active Problems:   HTN (hypertension)   HLD (hyperlipidemia)   Prediabetes   Normocytic anemia   Dehydration   Hypernatremia   Hyperchloremia   Renal insufficiency   Obesity (BMI 30-39.9)  SBO (small bowel obstruction) status post exploratory laparotomy and extensive lysis of adhesions POD 3 done by Dr. Redmond Pulling -s/p open ex laparotomy and extensive lysis of adhesions -Per general surgery, NG tube is been removed and they have placed the patient on a Clear Liquid Diet -Continue to ambulate and IS -Cont IVF with D5W + 40 mEQ KCl at 100 mL/hr  -Replace potassium to a level 4 and replace magnesium to a level of 2 ideally -Per general surgery, IV Acetaminophen has now stopped -C/w IV methocarbamol thousand grams every 8 hours scheduled  along with morphine IV 2 mg every 4 PRN for severe pain -Appreciate further surgical recommendations  Dehydration, Hypernatremia, Hyperchloremia, improving Elevation in Cr/Renal Insuffiencey  Mild Metabolic Acidosis -Urine output improved after increasing IVF  -Na+ is trending down and went from 149 -> 146 and repeat this morning was 141 -Chloride is trending down as well and went from 115 is 112 -She continues to have a good urine output despite her rise in Cr from 0.89 to 1.07 (which may be in response to surgery; Now Cr is improving and is 1.01 -Her IVF were switched to D5W on 7/27 due to rising sodium and chloride -Cont D5W for now and cont to follow electrolytes and I and O  Normocytic Anemia -Hemoglobin/hematocrit is now stable at 10.7/34.1 -?  Drop due to acute illness and IVF hydration -Checked Anemia Panel and showed iron level of 13, U IBC of 284, TIBC of 297, saturation ratios of 4%, ferritin of 56, folate level 5.2, vitamin B12 level of 188 -Continue to monitor for signs and symptoms of bleeding; currently no overt bleeding noted -Repeat CBC in a.m.  Burning Eyes -Possibly dry eyes -Naphazoline-Glycerin 1-2 Drops Both Eyes have helped  Hyperglycemia the setting of Pre-Diabetes -Likely in the setting of D5W but blood sugars have ranged from 116-137 on daily BMPs/CMP's ; this AM was 122 -Recent Hemoglobin A1c was 6.3 on 01/19/2019 -Continue monitor blood sugars carefully and if necessary will need to place on sensitive along sliding scale AC  HTN (Hypertension) -She takes Amlodipine at at home which is on hold until she tolerates po -C/w Labetalol  10 mg q6hprn for SBP>170  HLD (Hyperlipidemia) -Continue to hold Lipitor until tolerating po  Hypokalemia -Patient's K+ this AM was 3.5 -Replete with IV KCl 40 mEQ again; also has D5W + 40 mEQ KCl at 100 mL/hr -Mag level was 2.1 -Continue to Monitor and Replete as Necessary -Repeat CMP in AM   Obesity -Estimated  body mass index is 33.35 kg/m as calculated from the following:   Height as of this encounter: 5\' 2"  (1.575 m).   Weight as of this encounter: 82.7 kg. -Weight Loss and Dietary Counseling given   DVT prophylaxis: Enoxaparin 40 mg q24h Code Status: FULL CODE Family Communication: No family present at bedside  Disposition Plan: Pending Improvement of Ileus and tolerance of Diet and per Surgical Clearance   Consultants:   General Surgery   Procedures:  Diagnostic laparoscopy converted to exploratory laparotomy, adhesiolysis for one hour done by Dr. Redmond Pulling on 01/22/2019  Antimicrobials:  Anti-infectives (From admission, onward)   Start     Dose/Rate Route Frequency Ordered Stop   01/22/19 0845  ciprofloxacin (CIPRO) IVPB 400 mg     400 mg 200 mL/hr over 60 Minutes Intravenous  Once 01/22/19 0841 01/22/19 1310   01/22/19 0845  metroNIDAZOLE (FLAGYL) IVPB 1,000 mg 200 mL     1,000 mg 200 mL/hr over 60 Minutes Intravenous  Once 01/22/19 0841 01/22/19 1313     Subjective: Patient was seen and examined at bedside and denied any pain currently except that she was having some burning in her IV from the potassium.  States that she slept well.  No nausea or vomiting.  States that she is happy to have her NG tube removed and has been ambulating the halls significantly today.  No chest pain, lightheadedness or dizziness.  Still has not had any flatus but denies any worsening abdominal pain.  No other concerns or complaints at this time.  Objective: Vitals:   01/24/19 0429 01/24/19 1329 01/24/19 2141 01/25/19 0612  BP: (!) 139/94 120/88 (!) 124/97 115/78  Pulse: (!) 108 (!) 105 (!) 110 (!) 107  Resp: 16 14 16 16   Temp: 98.3 F (36.8 C) 98.8 F (37.1 C) 98.6 F (37 C) 98.7 F (37.1 C)  TempSrc: Oral Oral Oral Oral  SpO2: 98% 98% 99% 98%  Weight:      Height:        Intake/Output Summary (Last 24 hours) at 01/25/2019 1308 Last data filed at 01/25/2019 0757 Gross per 24 hour  Intake  894.83 ml  Output 0 ml  Net 894.83 ml   Filed Weights   01/19/19 1042 01/19/19 1725  Weight: 83.9 kg 82.7 kg   Examination: Physical Exam:  Constitutional: Well-nourished, well-developed obese African-American female currently in no acute distress appears calm today  Eyes: Lids and conjunctive normal.  Sclera anicteric ENMT: External ears nose appear normal.  Grossly normal hearing Neck: Appears supple with no JVD Respiratory: Mildly diminished auscultation bilaterally no appreciable wheezing rales rhonchi.  Patient not tachypneic or using any accessory muscles to breathe Cardiovascular: Regular rate rhythm.  No appreciable murmurs, rubs, gallops.  Has trace of edema Abdomen: Soft, mildly tender, distended second body habitus.  Bowel sounds present.  Abdominal dressing in place clean dry and intact. GU: Deferred Musculoskeletal: No contractures or cyanosis.  No joint deformities in the upper lower extremities Skin: Skin is warm and dry no appreciable rashes or lesions on to skin evaluation. Neurologic: Cranial nerves II through XII grossly intact no appreciable focal deficits.  Romberg sign cerebellar reflexes were not assessed Psychiatric: Normal judgment and insight.  Patient is awake, alert, oriented x3.  Slightly anxious but not as frustrated today  Data Reviewed: I have personally reviewed following labs and imaging studies  CBC: Recent Labs  Lab 01/20/19 0223 01/22/19 0415 01/23/19 0329 01/24/19 0142 01/25/19 0210  WBC 4.1 5.5 9.8 9.4 8.3  NEUTROABS 2.7  --   --   --  6.1  HGB 11.2* 11.1* 9.9* 10.2* 10.7*  HCT 35.6* 35.4* 31.2* 32.4* 34.1*  MCV 91.8 93.7 92.6 91.3 91.9  PLT 420* 396 351 392 709   Basic Metabolic Panel: Recent Labs  Lab 01/20/19 0223 01/21/19 1054 01/22/19 0415 01/23/19 0329 01/24/19 0142 01/25/19 0210  NA 143 148* 149* 146* 146* 141  K 3.6 3.7 3.4* 3.9 3.2* 3.5  CL 110 115* 112* 115* 115* 112*  CO2 25 25 24 23 22  20*  GLUCOSE 137* 116* 120*  131* 131* 122*  BUN 8 6* 5* 9 8 8   CREATININE 0.97 0.84 0.89 1.07* 1.01* 1.01*  CALCIUM 10.0 9.8 10.1 9.5 9.9 9.5  MG 2.4  --  2.3  --  2.2 2.1  PHOS 3.5  --   --   --   --  3.3   GFR: Estimated Creatinine Clearance: 52.4 mL/min (A) (by C-G formula based on SCr of 1.01 mg/dL (H)). Liver Function Tests: Recent Labs  Lab 01/19/19 1046 01/20/19 0223 01/25/19 0210  AST 13* 12* 13*  ALT 12 12 13   ALKPHOS 87 78 65  BILITOT 1.7* 1.2 0.9  PROT 6.8 6.8 6.2*  ALBUMIN 3.9 3.6 3.4*   Recent Labs  Lab 01/19/19 1046  LIPASE 31   No results for input(s): AMMONIA in the last 168 hours. Coagulation Profile: No results for input(s): INR, PROTIME in the last 168 hours. Cardiac Enzymes: No results for input(s): CKTOTAL, CKMB, CKMBINDEX, TROPONINI in the last 168 hours. BNP (last 3 results) No results for input(s): PROBNP in the last 8760 hours. HbA1C: No results for input(s): HGBA1C in the last 72 hours. CBG: No results for input(s): GLUCAP in the last 168 hours. Lipid Profile: No results for input(s): CHOL, HDL, LDLCALC, TRIG, CHOLHDL, LDLDIRECT in the last 72 hours. Thyroid Function Tests: No results for input(s): TSH, T4TOTAL, FREET4, T3FREE, THYROIDAB in the last 72 hours. Anemia Panel: Recent Labs    01/23/19 1012 01/24/19 0142  VITAMINB12  --  188  FOLATE  --  5.2*  FERRITIN 56  --   TIBC 297  --   IRON 13*  --   RETICCTPCT 1.9  --    Sepsis Labs: No results for input(s): PROCALCITON, LATICACIDVEN in the last 168 hours.  Recent Results (from the past 240 hour(s))  SARS Coronavirus 2 (CEPHEID - Performed in [facility] lab), Hosp Order     Status: None   Collection Time: 01/19/19  2:47 PM   Specimen: Nasopharyngeal Swab  Result Value Ref Range Status   SARS Coronavirus 2 NEGATIVE NEGATIVE Final    Comment: (NOTE) If result is NEGATIVE SARS-CoV-2 target nucleic acids are NOT DETECTED. The SARS-CoV-2 RNA is generally detectable in upper and lower   respiratory specimens during the acute phase of infection. The lowest  concentration of SARS-CoV-2 viral copies this assay can detect is 250  copies / mL. A negative result does not preclude SARS-CoV-2 infection  and should not be used as the sole basis for treatment or other  patient management decisions.  A negative  result may occur with  improper specimen collection / handling, submission of specimen other  than nasopharyngeal swab, presence of viral mutation(s) within the  areas targeted by this assay, and inadequate number of viral copies  (<250 copies / mL). A negative result must be combined with clinical  observations, patient history, and epidemiological information. If result is POSITIVE SARS-CoV-2 target nucleic acids are DETECTED. The SARS-CoV-2 RNA is generally detectable in upper and lower  respiratory specimens dur ing the acute phase of infection.  Positive  results are indicative of active infection with SARS-CoV-2.  Clinical  correlation with patient history and other diagnostic information is  necessary to determine patient infection status.  Positive results do  not rule out bacterial infection or co-infection with other viruses. If result is PRESUMPTIVE POSTIVE SARS-CoV-2 nucleic acids MAY BE PRESENT.   A presumptive positive result was obtained on the submitted specimen  and confirmed on repeat testing.  While 2019 novel coronavirus  (SARS-CoV-2) nucleic acids may be present in the submitted sample  additional confirmatory testing may be necessary for epidemiological  and / or clinical management purposes  to differentiate between  SARS-CoV-2 and other Sarbecovirus currently known to infect humans.  If clinically indicated additional testing with an alternate test  methodology [phone]) is advised. The SARS-CoV-2 RNA is generally  detectable in upper and lower respiratory sp ecimens during the acute  phase of infection. The expected result is Negative. Fact  Sheet for Patients:  StrictlyIdeas.no Fact Sheet for Healthcare Providers: BankingDealers.co.za This test is not yet approved or cleared by the [place] FDA and has been authorized for detection and/or diagnosis of SARS-CoV-2 by FDA under an Emergency Use Authorization (EUA).  This EUA will remain in effect (meaning this test can be used) for the duration of the COVID-19 declaration under Section 564(b)(1) of the Act, 21 U.S.C. section 360bbb-3(b)(1), unless the authorization is terminated or revoked sooner. Performed at [facility], [place] [address], [place], [place] [address]   Surgical pcr screen     Status: None   Collection Time: 01/22/19  9:05 AM   Specimen: Nasal Mucosa; Nasal Swab  Result Value Ref Range Status   MRSA, PCR NEGATIVE NEGATIVE Final   Staphylococcus aureus NEGATIVE NEGATIVE Final    Comment: (NOTE) The Xpert SA Assay (FDA approved for NASAL specimens in patients 38 years of age and older), is one component of a comprehensive surveillance program. It is not intended to diagnose infection nor to guide or monitor treatment. Performed at [facility], [place] [address], [place], [place] [address]     Radiology Studies: No results found.  Scheduled Meds: . DULoxetine  60 mg Oral Daily  . enoxaparin (LOVENOX) injection  40 mg Subcutaneous Q24H  . lithium carbonate  600 mg Oral Daily  . potassium chloride  20 mEq Oral BID   Continuous Infusions: . dextrose 5 % with kcl 100 mL/hr at 01/25/19 0535  . methocarbamol (ROBAXIN) IV 1,000 mg (01/25/19 0536)  . potassium chloride 10 mEq (01/25/19 1221)    LOS: 6 days   Kerney Elbe, DO [facility] PAGER is on [place]  If 7PM-7AM, please contact night-coverage [email or web address] Password [email or web address] 01/25/2019, 1:08 PM

## 2019-01-26 DIAGNOSIS — E872 Acidosis: Secondary | ICD-10-CM

## 2019-01-26 LAB — CBC WITH DIFFERENTIAL/PLATELET
Abs Immature Granulocytes: 0.04 10*3/uL (ref 0.00–0.07)
Basophils Absolute: 0 10*3/uL (ref 0.0–0.1)
Basophils Relative: 0 %
Eosinophils Absolute: 0.2 10*3/uL (ref 0.0–0.5)
Eosinophils Relative: 3 %
HCT: 33.5 % — ABNORMAL LOW (ref 36.0–46.0)
Hemoglobin: 10.4 g/dL — ABNORMAL LOW (ref 12.0–15.0)
Immature Granulocytes: 1 %
Lymphocytes Relative: 10 %
Lymphs Abs: 0.9 10*3/uL (ref 0.7–4.0)
MCH: 28.7 pg (ref 26.0–34.0)
MCHC: 31 g/dL (ref 30.0–36.0)
MCV: 92.5 fL (ref 80.0–100.0)
Monocytes Absolute: 0.4 10*3/uL (ref 0.1–1.0)
Monocytes Relative: 4 %
Neutro Abs: 6.8 10*3/uL (ref 1.7–7.7)
Neutrophils Relative %: 82 %
Platelets: 380 10*3/uL (ref 150–400)
RBC: 3.62 MIL/uL — ABNORMAL LOW (ref 3.87–5.11)
RDW: 17.1 % — ABNORMAL HIGH (ref 11.5–15.5)
WBC: 8.2 10*3/uL (ref 4.0–10.5)
nRBC: 0 % (ref 0.0–0.2)

## 2019-01-26 LAB — COMPREHENSIVE METABOLIC PANEL
ALT: 12 U/L (ref 0–44)
AST: 13 U/L — ABNORMAL LOW (ref 15–41)
Albumin: 3.4 g/dL — ABNORMAL LOW (ref 3.5–5.0)
Alkaline Phosphatase: 61 U/L (ref 38–126)
Anion gap: 8 (ref 5–15)
BUN: 7 mg/dL — ABNORMAL LOW (ref 8–23)
CO2: 18 mmol/L — ABNORMAL LOW (ref 22–32)
Calcium: 9.7 mg/dL (ref 8.9–10.3)
Chloride: 110 mmol/L (ref 98–111)
Creatinine, Ser: 0.88 mg/dL (ref 0.44–1.00)
GFR calc Af Amer: >60 mL/min (ref >60)
GFR calc non Af Amer: >60 mL/min (ref >60)
Glucose, Bld: 110 mg/dL — ABNORMAL HIGH (ref 70–99)
Potassium: 4.7 mmol/L (ref 3.5–5.1)
Sodium: 136 mmol/L (ref 135–145)
Total Bilirubin: 0.6 mg/dL (ref 0.3–1.2)
Total Protein: 6.2 g/dL — ABNORMAL LOW (ref 6.5–8.1)

## 2019-01-26 LAB — PHOSPHORUS: Phosphorus: 3.4 mg/dL (ref 2.5–4.6)

## 2019-01-26 LAB — PREALBUMIN: Prealbumin: 18.9 mg/dL (ref 18–38)

## 2019-01-26 LAB — MAGNESIUM: Magnesium: 2 mg/dL (ref 1.7–2.4)

## 2019-01-26 MED ORDER — PANTOPRAZOLE SODIUM 40 MG PO TBEC
40.0000 mg | DELAYED_RELEASE_TABLET | Freq: Every day | ORAL | Status: DC
  Administered 2019-01-26 – 2019-01-29 (×4): 40 mg via ORAL
  Filled 2019-01-26 (×4): qty 1

## 2019-01-26 MED ORDER — METHOCARBAMOL 500 MG PO TABS
1000.0000 mg | ORAL_TABLET | Freq: Three times a day (TID) | ORAL | Status: DC | PRN
  Administered 2019-01-26 – 2019-01-27 (×2): 1000 mg via ORAL
  Filled 2019-01-26 (×2): qty 2

## 2019-01-26 MED ORDER — ALUM & MAG HYDROXIDE-SIMETH 200-200-20 MG/5ML PO SUSP
30.0000 mL | Freq: Once | ORAL | Status: AC
  Administered 2019-01-26: 04:00:00 30 mL via ORAL
  Filled 2019-01-26: qty 30

## 2019-01-26 MED ORDER — ALUM & MAG HYDROXIDE-SIMETH 200-200-20 MG/5ML PO SUSP
15.0000 mL | Freq: Four times a day (QID) | ORAL | Status: DC | PRN
  Administered 2019-01-26: 15 mL via ORAL
  Filled 2019-01-26 (×2): qty 30

## 2019-01-26 NOTE — Progress Notes (Signed)
4 Days Post-Op    ZJ:[id number] pain  Subjective: She feels about the same as yesterday.  She is still distended, bowel sounds are still somewhat hypoactive and high-pitched.  She had couple episodes of emesis last p.m. but she also has had 2 bowel movements.  Midline incision looks fine.  Objective: Vital signs in last 24 hours: Temp:  [98.5 F (36.9 C)-99.1 F (37.3 C)] 99.1 F (37.3 C) (07/31 0454) Pulse Rate:  [91-106] 99 (07/31 0454) Resp:  [14-20] 18 (07/31 0454) BP: (104-139)/(75-94) 104/81 (07/31 0454) SpO2:  [97 %-99 %] 98 % (07/31 0454) Last BM Date: 01/26/19 580 Po 1500 IV BM x 2 Reported emesis last PM Afebrile, VSS Labs OK   Intake/Output from previous day: 07/30 0701 - 07/31 0700 In: 2120.5 [P.O.:580; I.V.:940.5; IV Piggyback:600] Out: -  Intake/Output this shift: No intake/output data recorded.  General appearance: alert, cooperative, no distress and Still does not feel great. Resp: clear to auscultation bilaterally GI: Abdomen still distended.  Midline incision looks fine.  She has high-pitched/hypoactive bowel sounds.  She has had some emesis last p.m. but also has had 2 bowel movements.  Lab Results:  Recent Labs    01/25/19 0210 01/26/19 0248  WBC 8.3 8.2  HGB 10.7* 10.4*  HCT 34.1* 33.5*  PLT 380 380    BMET Recent Labs    01/25/19 0210 01/26/19 0248  NA 141 136  K 3.5 4.7  CL 112* 110  CO2 20* 18*  GLUCOSE 122* 110*  BUN 8 7*  CREATININE 1.01* 0.88  CALCIUM 9.5 9.7   PT/INR No results for input(s): LABPROT, INR in the last 72 hours.  Recent Labs  Lab 01/19/19 1046 01/20/19 0223 01/25/19 0210 01/26/19 0248  AST 13* 12* 13* 13*  ALT 12 12 13 12   ALKPHOS 87 78 65 61  BILITOT 1.7* 1.2 0.9 0.6  PROT 6.8 6.8 6.2* 6.2*  ALBUMIN 3.9 3.6 3.4* 3.4*     Lipase     Component Value Date/Time   LIPASE 31 01/19/2019 1046     Medications: . DULoxetine  60 mg Oral Daily  . enoxaparin (LOVENOX) injection  40 mg Subcutaneous  Q24H  . lithium carbonate  600 mg Oral Daily  . potassium chloride  20 mEq Oral BID    Assessment/Plan Dehydration Hypertension Anxiety/depression/Bipolar hx Hyperlipidemia Hypernatremia/hyperchloremia - improved Hypokakemia - replacing K+ COVIDnegative7/24/2020  Small bowel obstruction Diagnostic laparoscopy converted to exploratory laparotomy, lysis of adhesions x1 hour, 01/22/2019 Dr. Greer Pickerel  POD# 4 Findings: (numerous interloop adhesions;small bowel to chronic areas of obstruction x 2)  FEN: IV fluids/NG out 7/29 >> clears 7/30 ID: Preop Cipro/Flagyl DVT: Lovenox Follow-up: Dr. Redmond Pulling POC: [name]   [phone]   Plan: Continue clear liquids.  Continue to mobilize.  Await return of bowel function.  I have added a prealbumin to her morning labs.  If she does not improve over the next 24 hours we will need to consider TPN.        LOS: 7 days    Etheleen Valtierra 01/26/2019 [phone]

## 2019-01-26 NOTE — Progress Notes (Signed)
0000 Pt had 1 occurence of small bowel movement (type 2: green/clay in color)  0350 Pt vomited bile, verbalized she is having heartburn but not nauseated. Informed MD for pt's request for med. Given Maalox 30 ml once per MD's order, tolerated well. Pt also had another occurrence of moderate bowel movement this morning (type 6: green/clay in color). This RN and tech changed pt's gown and sheets, left lying comfortably in bed and will continue to monitor.

## 2019-01-26 NOTE — Progress Notes (Signed)
PROGRESS NOTE    [name]  KDX:[id number] DOB: 03/31/1951 DOA: 01/19/2019 PCP: Gregor Hams, FNP  Brief Narrative:  [name] is a 68 y.o. AAfemalewith medical history significant for but not limited togynecological abdominal surgeries x2, essential hypertension, chronic depression/anxiety/bipolar disorder as well as others who presented to [facility] ED with complaints of severe abdominal pain associated with nausea and vomiting x4 days. CT abdomen and pelvis with contrast revealed partial small bowel obstruction.  She underwent a diagnostic laparoscopic and converted to exploratory laparotomy as well as adhesional lysis for least 1 hour.  She is postoperative day 3 and now has a postoperative ileus.  Currently she is mobilizing but still not passing any flatus or having a bowel movement.  General Surgery tried a clamping trial and removed the NG tube yesterday.  She was started on a clear liquid diet and continues to mobilize and last night she vomited but had 2 bowel movements.  General surgery recommending continuing sips of clears and if no significant improvement in next 24 to 36 hours they recommend TPN.  Assessment & Plan:   Principal Problem:   SBO (small bowel obstruction) (HCC) Active Problems:   HTN (hypertension)   HLD (hyperlipidemia)   Prediabetes   Normocytic anemia   Dehydration   Hypernatremia   Hyperchloremia   Renal insufficiency   Obesity (BMI 30-39.9)  SBO (small bowel obstruction) status post exploratory laparotomy and extensive lysis of adhesions POD 4 done by Dr. Redmond Pulling -s/p open ex laparotomy and extensive lysis of adhesions -Per general surgery, NG tube is been removed and they have placed the patient on a Clear Liquid Diet and she is tolerating sips of clears however ileus is slowly improving -Continue to ambulate and IS -Discontinued IVF with D5W + 40 mEQ KCl at 100 mL/hr given that her potassium level was elevating and that she is now  becoming slightly metabolic acidotic -Replace potassium to a level 4 and replace magnesium to a level of 2 ideally -Per general surgery, IV Acetaminophen has now stopped -C/w IV methocarbamol thousand grams every 8 hours scheduled along with morphine IV 2 mg every 4 PRN for severe pain -Appreciate further surgical recommendations and they feel that her ileus is slowly improving.  If no significant improvement (less distention, more bowel movements and flatus) in the next 24 to 36 hours they recommend TPN and this is been discussed with the patient -Prealbumin this morning was 18.9 -Continue to await return for bowel function and if she does not improve next 24 hours they are recommending considering TPN; still has some abdominal distention and had some vomiting last night but did have 2 bowel movements last night.  Dehydration, Hypernatremia, Hyperchloremia, improving Elevation in Cr/Renal Insuffiencey; improved Mild Metabolic Acidosis, slightly worsened in the setting of D5W -Urine output improved after increasing IVF  -Na+ is trending down and is improved 136 -Chloride is trending down and is now 110 -CO2 is now 18 and anion gap is 8 -She continues to have a good urine output despite her rise in Cr from 0.89 to 1.07 (which may be in response to surgery; Now Cr is improving and is 0.88 -Her IVF was switched to D5W on 7/27 due to rising sodium and chloride and has now been stopped today completely -Discontinued D5W for now and cont to follow electrolytes and I and O -Repeat CMP in a.m.  Normocytic Anemia -Hemoglobin/hematocrit is now stable at 10.4/33.5 -?  Drop due to acute illness and IVF  hydration; IV fluids have now been stopped -Checked Anemia Panel and showed iron level of 13, U IBC of 284, TIBC of 297, saturation ratios of 4%, ferritin of 56, folate level 5.2, vitamin B12 level of 188 -Continue to monitor for signs and symptoms of bleeding; currently no overt bleeding noted -Repeat  CBC in a.m.  Burning Eyes -Possibly dry eyes -Naphazoline-Glycerin 1-2 Drops Both Eyes have helped  Hyperglycemia the setting of Pre-Diabetes -Likely in the setting of D5W but blood sugars have ranged from 110-137 on daily BMPs/CMP's ; this AM was 110 -Recent Hemoglobin A1c was 6.3 on 01/19/2019 -Continue monitor blood sugars carefully and if necessary will need to place on sensitive along sliding scale AC  HTN (Hypertension) -She takes Amlodipine at at home which is on hold until she tolerates po] -Blood pressures been low regardless and has been 104/81 -C/w Labetalol 10 mg q6hprn for SBP>170  HLD (Hyperlipidemia) -Continue to hold Lipitor until tolerating po  Hypokalemia -Patient's K+ this AM was 4.7 -IV fluid with D5W is now been stopped -Mag level was 2.0 -Continue to Monitor and Replete as Necessary -Repeat CMP in AM   Obesity -Estimated body mass index is 33.35 kg/m as calculated from the following:   Height as of this encounter: 5\' 2"  (1.575 m).   Weight as of this encounter: 82.7 kg. -Weight Loss and Dietary Counseling given   DVT prophylaxis: Enoxaparin 40 mg q24h Code Status: FULL CODE Family Communication: No family present at bedside  Disposition Plan: Pending Improvement of Ileus and tolerance of Diet and per Surgical Clearance; anticipate being released 24 to 48 hours more until ileus resolves and she is tolerating a diet without issues and having bowel movements and decreased distention of abdomen  Consultants:   General Surgery   Procedures:  Diagnostic laparoscopy converted to exploratory laparotomy, adhesiolysis for one hour done by Dr. Redmond Pulling on 01/22/2019  Antimicrobials:  Anti-infectives (From admission, onward)   Start     Dose/Rate Route Frequency Ordered Stop   01/22/19 0845  ciprofloxacin (CIPRO) IVPB 400 mg     400 mg 200 mL/hr over 60 Minutes Intravenous  Once 01/22/19 0841 01/22/19 1310   01/22/19 0845  metroNIDAZOLE (FLAGYL) IVPB 1,000  mg 200 mL     1,000 mg 200 mL/hr over 60 Minutes Intravenous  Once 01/22/19 0841 01/22/19 1313     Subjective: Patient was seen and examined at bedside and was in good spirits but did have some vomiting last night.  States that she is surprised that she actually had some bowel movements 2.  No nausea or vomiting now.  Surgery discussed with her about having TPN and she is hopeful to avoid TPN in hopes that she improve the next couple days.  She continues to ambulate and mobilize.  No other concerns or complaints at this time.  Objective: Vitals:   01/25/19 0612 01/25/19 1412 01/25/19 2053 01/26/19 0454  BP: 115/78 113/75 (!) 139/94 104/81  Pulse: (!) 107 (!) 106 91 99  Resp: 16 14 20 18   Temp: 98.7 F (37.1 C) 98.5 F (36.9 C) 98.9 F (37.2 C) 99.1 F (37.3 C)  TempSrc: Oral Oral Oral Oral  SpO2: 98% 99% 97% 98%  Weight:      Height:        Intake/Output Summary (Last 24 hours) at 01/26/2019 1115 Last data filed at 01/25/2019 1500 Gross per 24 hour  Intake 2120.47 ml  Output -  Net 2120.47 ml   [employer]  01/19/19 1042 01/19/19 1725  Weight: 83.9 kg 82.7 kg   Examination: Physical Exam:  Constitutional: Well-nourished, well-developed obese African-American female currently in no acute distress and she is in good spirits today and appears calm.  Last night she vomited but she is not nauseous now Eyes: Lids and conjunctive are normal.  Sclera anicteric ENMT: External ears nose appear normal.  Grossly normal hearing Neck: Appears supple no JVD Respiratory: Has mildly diminished auscultation bilaterally with no appreciable wheezing, rales, rhonchi.  Patient not tachypneic or using any accessory muscles to breathe Cardiovascular: Regular rate and rhythm.  No appreciable murmurs, rubs, gallops.  Trace lower extremity edema Abdomen: Soft, mildly tender.  Still remains distended second body habitus.  Slightly hypertympanic.  Bowel sounds are present but there somewhat  hypoactive and diminished GU: Deferred Musculoskeletal: No contractures or cyanosis.  No joint deformities in upper lower extremities Skin: Skin is warm and dry no appreciable rashes or lesions Physical evaluation Neurologic: Cranial nerves II through XII grossly intact no appreciable focal deficits.  Romberg sign cerebellar reflexes were not assessed Psychiatric: Normal pleasant mood and affect.  Intact judgment insight.  Patient is awake, alert, oriented x3  Data Reviewed: I have personally reviewed following labs and imaging studies  CBC: Recent Labs  Lab 01/20/19 0223 01/22/19 0415 01/23/19 0329 01/24/19 0142 01/25/19 0210 01/26/19 0248  WBC 4.1 5.5 9.8 9.4 8.3 8.2  NEUTROABS 2.7  --   --   --  6.1 6.8  HGB 11.2* 11.1* 9.9* 10.2* 10.7* 10.4*  HCT 35.6* 35.4* 31.2* 32.4* 34.1* 33.5*  MCV 91.8 93.7 92.6 91.3 91.9 92.5  PLT 420* 396 351 392 380 703   Basic Metabolic Panel: Recent Labs  Lab 01/20/19 0223  01/22/19 0415 01/23/19 0329 01/24/19 0142 01/25/19 0210 01/26/19 0248  NA 143   < > 149* 146* 146* 141 136  K 3.6   < > 3.4* 3.9 3.2* 3.5 4.7  CL 110   < > 112* 115* 115* 112* 110  CO2 25   < > 24 23 22  20* 18*  GLUCOSE 137*   < > 120* 131* 131* 122* 110*  BUN 8   < > 5* 9 8 8  7*  CREATININE 0.97   < > 0.89 1.07* 1.01* 1.01* 0.88  CALCIUM 10.0   < > 10.1 9.5 9.9 9.5 9.7  MG 2.4  --  2.3  --  2.2 2.1 2.0  PHOS 3.5  --   --   --   --  3.3 3.4   < > = values in this interval not displayed.   GFR: Estimated Creatinine Clearance: 60.1 mL/min (by C-G formula based on SCr of 0.88 mg/dL). Liver Function Tests: Recent Labs  Lab 01/20/19 0223 01/25/19 0210 01/26/19 0248  AST 12* 13* 13*  ALT 12 13 12   ALKPHOS 78 65 61  BILITOT 1.2 0.9 0.6  PROT 6.8 6.2* 6.2*  ALBUMIN 3.6 3.4* 3.4*   No results for input(s): LIPASE, AMYLASE in the last 168 hours. No results for input(s): AMMONIA in the last 168 hours. Coagulation Profile: No results for input(s): INR, PROTIME in  the last 168 hours. Cardiac Enzymes: No results for input(s): CKTOTAL, CKMB, CKMBINDEX, TROPONINI in the last 168 hours. BNP (last 3 results) No results for input(s): PROBNP in the last 8760 hours. HbA1C: No results for input(s): HGBA1C in the last 72 hours. CBG: No results for input(s): GLUCAP in the last 168 hours. Lipid Profile: No results for input(s): CHOL,  HDL, LDLCALC, TRIG, CHOLHDL, LDLDIRECT in the last 72 hours. Thyroid Function Tests: No results for input(s): TSH, T4TOTAL, FREET4, T3FREE, THYROIDAB in the last 72 hours. Anemia Panel: Recent Labs    01/24/19 0142  VITAMINB12 188  FOLATE 5.2*   Sepsis Labs: No results for input(s): PROCALCITON, LATICACIDVEN in the last 168 hours.  Recent Results (from the past 240 hour(s))  SARS Coronavirus 2 (CEPHEID - Performed in [place] hospital lab), Hosp Order     Status: None   Collection Time: 01/19/19  2:47 PM   Specimen: Nasopharyngeal Swab  Result Value Ref Range Status   SARS Coronavirus 2 NEGATIVE NEGATIVE Final    Comment: (NOTE) If result is NEGATIVE SARS-CoV-2 target nucleic acids are NOT DETECTED. The SARS-CoV-2 RNA is generally detectable in upper and lower  respiratory specimens during the acute phase of infection. The lowest  concentration of SARS-CoV-2 viral copies this assay can detect is 250  copies / mL. A negative result does not preclude SARS-CoV-2 infection  and should not be used as the sole basis for treatment or other  patient management decisions.  A negative result may occur with  improper specimen collection / handling, submission of specimen other  than nasopharyngeal swab, presence of viral mutation(s) within the  areas targeted by this assay, and inadequate number of viral copies  (<250 copies / mL). A negative result must be combined with clinical  observations, patient history, and epidemiological information. If result is POSITIVE SARS-CoV-2 target nucleic acids are DETECTED. The  SARS-CoV-2 RNA is generally detectable in upper and lower  respiratory specimens dur ing the acute phase of infection.  Positive  results are indicative of active infection with SARS-CoV-2.  Clinical  correlation with patient history and other diagnostic information is  necessary to determine patient infection status.  Positive results do  not rule out bacterial infection or co-infection with other viruses. If result is PRESUMPTIVE POSTIVE SARS-CoV-2 nucleic acids MAY BE PRESENT.   A presumptive positive result was obtained on the submitted specimen  and confirmed on repeat testing.  While 2019 novel coronavirus  (SARS-CoV-2) nucleic acids may be present in the submitted sample  additional confirmatory testing may be necessary for epidemiological  and / or clinical management purposes  to differentiate between  SARS-CoV-2 and other Sarbecovirus currently known to infect humans.  If clinically indicated additional testing with an alternate test  methodology [phone]) is advised. The SARS-CoV-2 RNA is generally  detectable in upper and lower respiratory sp ecimens during the acute  phase of infection. The expected result is Negative. Fact Sheet for Patients:  StrictlyIdeas.no Fact Sheet for Healthcare Providers: BankingDealers.co.za This test is not yet approved or cleared by the [place] FDA and has been authorized for detection and/or diagnosis of SARS-CoV-2 by FDA under an Emergency Use Authorization (EUA).  This EUA will remain in effect (meaning this test can be used) for the duration of the COVID-19 declaration under Section 564(b)(1) of the Act, 21 U.S.C. section 360bbb-3(b)(1), unless the authorization is terminated or revoked sooner. Performed at [facility], [place] [address] [place], [place] [address]   Surgical pcr screen     Status: None   Collection Time: 01/22/19  9:05 AM   Specimen: Nasal Mucosa; Nasal Swab   Result Value Ref Range Status   MRSA, PCR NEGATIVE NEGATIVE Final   Staphylococcus aureus NEGATIVE NEGATIVE Final    Comment: (NOTE) The Xpert SA Assay (FDA approved for NASAL specimens in patients 22  years of age and older), is one component of a comprehensive surveillance program. It is not intended to diagnose infection nor to guide or monitor treatment. Performed at [facility], [place] [address] [place], [place] [address]     Radiology Studies: No results found.  Scheduled Meds: . DULoxetine  60 mg Oral Daily  . enoxaparin (LOVENOX) injection  40 mg Subcutaneous Q24H  . lithium carbonate  600 mg Oral Daily  . potassium chloride  20 mEq Oral BID   Continuous Infusions: . methocarbamol (ROBAXIN) IV 1,000 mg (01/26/19 0518)    LOS: 7 days   Kerney Elbe, DO [facility] PAGER is on [place]  If 7PM-7AM, please contact night-coverage [email or web address] Password [email or web address] 01/26/2019, 11:15 AM

## 2019-01-27 LAB — COMPREHENSIVE METABOLIC PANEL
ALT: 14 U/L (ref 0–44)
AST: 12 U/L — ABNORMAL LOW (ref 15–41)
Albumin: 3.5 g/dL (ref 3.5–5.0)
Alkaline Phosphatase: 62 U/L (ref 38–126)
Anion gap: 7 (ref 5–15)
BUN: 6 mg/dL — ABNORMAL LOW (ref 8–23)
CO2: 20 mmol/L — ABNORMAL LOW (ref 22–32)
Calcium: 9.9 mg/dL (ref 8.9–10.3)
Chloride: 109 mmol/L (ref 98–111)
Creatinine, Ser: 0.95 mg/dL (ref 0.44–1.00)
GFR calc Af Amer: >60 mL/min (ref >60)
GFR calc non Af Amer: >60 mL/min (ref >60)
Glucose, Bld: 105 mg/dL — ABNORMAL HIGH (ref 70–99)
Potassium: 4.5 mmol/L (ref 3.5–5.1)
Sodium: 136 mmol/L (ref 135–145)
Total Bilirubin: 1 mg/dL (ref 0.3–1.2)
Total Protein: 6.3 g/dL — ABNORMAL LOW (ref 6.5–8.1)

## 2019-01-27 LAB — CBC WITH DIFFERENTIAL/PLATELET
Abs Immature Granulocytes: 0.04 10*3/uL (ref 0.00–0.07)
Basophils Absolute: 0 10*3/uL (ref 0.0–0.1)
Basophils Relative: 0 %
Eosinophils Absolute: 0.3 10*3/uL (ref 0.0–0.5)
Eosinophils Relative: 5 %
HCT: 33.5 % — ABNORMAL LOW (ref 36.0–46.0)
Hemoglobin: 10.7 g/dL — ABNORMAL LOW (ref 12.0–15.0)
Immature Granulocytes: 1 %
Lymphocytes Relative: 14 %
Lymphs Abs: 0.9 10*3/uL (ref 0.7–4.0)
MCH: 28.6 pg (ref 26.0–34.0)
MCHC: 31.9 g/dL (ref 30.0–36.0)
MCV: 89.6 fL (ref 80.0–100.0)
Monocytes Absolute: 0.4 10*3/uL (ref 0.1–1.0)
Monocytes Relative: 6 %
Neutro Abs: 5 10*3/uL (ref 1.7–7.7)
Neutrophils Relative %: 74 %
Platelets: UNDETERMINED 10*3/uL (ref 150–400)
RBC: 3.74 MIL/uL — ABNORMAL LOW (ref 3.87–5.11)
RDW: 17.1 % — ABNORMAL HIGH (ref 11.5–15.5)
WBC: 6.7 10*3/uL (ref 4.0–10.5)
nRBC: 0 % (ref 0.0–0.2)

## 2019-01-27 LAB — PHOSPHORUS: Phosphorus: 3.3 mg/dL (ref 2.5–4.6)

## 2019-01-27 LAB — MAGNESIUM: Magnesium: 2.3 mg/dL (ref 1.7–2.4)

## 2019-01-27 MED ORDER — ACETAMINOPHEN 500 MG PO TABS
1000.0000 mg | ORAL_TABLET | Freq: Three times a day (TID) | ORAL | Status: DC
  Administered 2019-01-27 – 2019-01-29 (×6): 1000 mg via ORAL
  Filled 2019-01-27 (×7): qty 2

## 2019-01-27 MED ORDER — TRAMADOL HCL 50 MG PO TABS
50.0000 mg | ORAL_TABLET | Freq: Four times a day (QID) | ORAL | Status: DC | PRN
  Administered 2019-01-27: 50 mg via ORAL
  Filled 2019-01-27: qty 1

## 2019-01-27 MED ORDER — ATORVASTATIN CALCIUM 10 MG PO TABS
10.0000 mg | ORAL_TABLET | Freq: Every day | ORAL | Status: DC
  Administered 2019-01-27 – 2019-01-29 (×3): 10 mg via ORAL
  Filled 2019-01-27 (×3): qty 1

## 2019-01-27 NOTE — Progress Notes (Signed)
PROGRESS NOTE    [name]  NAT:[id number] DOB: 04/10/1951 DOA: 01/19/2019 PCP: Gregor Hams, FNP  Brief Narrative:  [name] is a 68 y.o. AAfemalewith medical history significant for but not limited togynecological abdominal surgeries x2, essential hypertension, chronic depression/anxiety/bipolar disorder as well as others who presented to [facility] ED with complaints of severe abdominal pain associated with nausea and vomiting x4 days. CT abdomen and pelvis with contrast revealed partial small bowel obstruction.  She underwent a diagnostic laparoscopic and converted to exploratory laparotomy as well as adhesional lysis for least 1 hour.  She is postoperative day 3 and now has a postoperative ileus.  Currently she is mobilizing but still not passing any flatus or having a bowel movement.  General Surgery tried a clamping trial and removed the NG tube yesterday.  She was started on a clear liquid diet and continues to mobilize and last night she had 3 bowel movements.  Abdomen still remains distended but general surgery started on a full liquid diet.   Assessment & Plan:   Principal Problem:   SBO (small bowel obstruction) (HCC) Active Problems:   HTN (hypertension)   HLD (hyperlipidemia)   Prediabetes   Normocytic anemia   Dehydration   Hypernatremia   Hyperchloremia   Renal insufficiency   Obesity (BMI 30-39.9)  SBO (small bowel obstruction) status post exploratory laparotomy and extensive lysis of adhesions POD 5 done by Dr. Redmond Pulling -s/p open ex laparotomy and extensive lysis of adhesions -Per general surgery, NG tube is been removed and they have placed the patient on a Clear Liquid Diet and she is tolerating sips of clears however ileus is slowly improving; clear liquid diet has now been advanced to full liquid diet by general surgery -Continue to ambulate and IS -Discontinued IVF with D5W + 40 mEQ KCl at 100 mL/hr given that her potassium level was elevating and  that she is now becoming slightly metabolic acidotic; acidosis is improving -Replace potassium to a level 4 and replace magnesium to a level of 2 ideally -Per general surgery, IV Acetaminophen has now stopped -C/w IV methocarbamol thousand grams every 8 hours scheduled along with morphine IV 2 mg every 4 PRN for severe pain -Appreciate further surgical recommendations and they feel that her ileus is slowly improving.  If no significant improvement (less distention, more bowel movements and flatus) in the next 24 to 36 hours they recommend TPN and this is been discussed with the patient -Prealbumin this morning was 18.9 -Continue to await return for bowel function and if she does not improve next 24 hours General Surgery was recommending considering TPN; still has some significant abdominal distention but had 3 bowel movements overnight and states she is passing flatus with her bowel movements  Dehydration, Hypernatremia, Hyperchloremia, improved Elevation in Cr/Renal Insuffiencey; improved Mild Metabolic Acidosis, slightly worsened in the setting of D5W,, improving -Urine output improved after increasing IVF  -Na+ is trending down and is improved 136 -Chloride is trending down and is now 109 -CO2 is now 20 and anion gap is 7 -Patient's BUN/creatinine is now 6/0.95 -Her IVF was switched to D5W on 7/27 due to rising sodium and chloride and has now been stopped today completely -Discontinued D5W for now and cont to follow electrolytes and I and O -Continue to monitor and trend renal function and repeat CMP in a.m.  Normocytic Anemia -Hemoglobin/hematocrit is now stable at 10.7/33.5 -?  Drop due to acute illness and IVF hydration; IV fluids have now  been stopped -Checked Anemia Panel and showed iron level of 13, U IBC of 284, TIBC of 297, saturation ratios of 4%, ferritin of 56, folate level 5.2, vitamin B12 level of 188 -Continue to monitor for signs and symptoms of bleeding; currently no  overt bleeding noted -Repeat CBC in a.m.  Burning Eyes -Possibly dry eyes -Naphazoline-Glycerin 1-2 Drops Both Eyes have helped  Hyperglycemia the setting of Pre-Diabetes -Likely in the setting of D5W but blood sugars have ranged from 105-137 on daily BMPs/CMP's ; this AM was 105 -Recent Hemoglobin A1c was 6.3 on 01/19/2019 -Continue monitor blood sugars carefully and if necessary will need to place on sensitive along sliding scale AC  HTN (Hypertension) -She takes Amlodipine at at home which is on hold until blood pressure improves  -Blood pressures been low regardless and has been 103/88 -C/w Labetalol 10 mg q6hprn for SBP>170  HLD (Hyperlipidemia) -We will resume atorvastatin 10 mg p.o. daily now  Hypokalemia -Patient's K+ this AM was 4.5 -IV fluid with D5W is now been stopped -Mag level was 2.3 -Continue to Monitor and Replete as Necessary -Repeat CMP in AM   Obesity -Estimated body mass index is 33.35 kg/m as calculated from the following:   Height as of this encounter: 5\' 2"  (1.575 m).   Weight as of this encounter: 82.7 kg. -Weight Loss and Dietary Counseling given   Anxiety, Depression, Bipolar Disorder -Resumed Duloxetine 60 mg p.o. daily but was stopped by Surgery -C/w lithium carbonate 600 mg p.o. daily  DVT prophylaxis: Enoxaparin 40 mg q24h Code Status: FULL CODE Family Communication: No family present at bedside  Disposition Plan: Pending Improvement of Ileus and tolerance of Diet and per Surgical Clearance; anticipate being discharged in the next 24 to 48 hours when ileus resolves and she is tolerating a diet without issues and having bowel movements and decreased distention of abdomen  Consultants:   General Surgery   Procedures:  Diagnostic laparoscopy converted to exploratory laparotomy, adhesiolysis for one hour done by Dr. Redmond Pulling on 01/22/2019  Antimicrobials:  Anti-infectives (From admission, onward)   Start     Dose/Rate Route Frequency  Ordered Stop   01/22/19 0845  ciprofloxacin (CIPRO) IVPB 400 mg     400 mg 200 mL/hr over 60 Minutes Intravenous  Once 01/22/19 0841 01/22/19 1310   01/22/19 0845  metroNIDAZOLE (FLAGYL) IVPB 1,000 mg 200 mL     1,000 mg 200 mL/hr over 60 Minutes Intravenous  Once 01/22/19 0841 01/22/19 1313     Subjective: Patient was seen and examined at bedside and had no complaints but states she had 3 bowel movements overnight.  No nausea or vomiting.  Diet was advanced to full liquid diet by general surgery.  Still has some significant abdominal distention but denies any pain.  Objective: Vitals:   01/26/19 0454 01/26/19 1452 01/26/19 2025 01/27/19 0500  BP: 104/81 115/76 124/90 103/88  Pulse: 99 97 98 95  Resp: 18 17 18 18   Temp: 99.1 F (37.3 C) 98.7 F (37.1 C) 99.3 F (37.4 C) 99.3 F (37.4 C)  TempSrc: Oral Oral Oral Oral  SpO2: 98% 100% 97% 99%  Weight:      Height:       No intake or output data in the 24 hours ending 01/27/19 1144 Filed Weights   01/19/19 1042 01/19/19 1725  Weight: 83.9 kg 82.7 kg   Examination: Physical Exam:  Constitutional: Well-nourished, well-developed obese African-American female currently no acute distress and she is doing better today  and tolerating her full liquid diet without issues but remains distended in her abdomen Eyes: There is a conjunctive are normal.  Sclera anicteric ENMT: External ears nose appear normal.  Grossly normal hearing.  Mucous members are moist Neck: Appears supple no JVD Respiratory: Mildly diminished auscultation bilaterally no patient wheezing, rales and rhonchi.  Patient not tachypneic wheezing excess muscle breathe Cardiovascular: Regular rate and rhythm.  No appreciable murmurs, rubs, gallops.  Trace extremity edema Abdomen: Soft, slightly tender to palpate.  Remains significantly distended secondary body habitus as well as distention from surgery.  Bowel sounds are present but slightly hypoactive diminished GU: Deferred  Musculoskeletal: No contractures or cyanosis Skin: Skin is warm and dry no appreciable rashes or lesions limited skin evaluation; abdominal incision honeycomb dressing appears clean dry intact and she has staples on the left side Neurologic: Cranial nerves II through XII gross intact no appreciable focal deficits.  Romberg sign cerebellar reflexes were not assessed Psychiatric: Normal and pleasant mood and affect.  Intact judgment and insight..  Patient is awake, alert, oriented x3  Data Reviewed: I have personally reviewed following labs and imaging studies  CBC: Recent Labs  Lab 01/23/19 0329 01/24/19 0142 01/25/19 0210 01/26/19 0248 01/27/19 0211  WBC 9.8 9.4 8.3 8.2 6.7  NEUTROABS  --   --  6.1 6.8 5.0  HGB 9.9* 10.2* 10.7* 10.4* 10.7*  HCT 31.2* 32.4* 34.1* 33.5* 33.5*  MCV 92.6 91.3 91.9 92.5 89.6  PLT 351 392 380 380 PLATELET CLUMPS NOTED ON SMEAR, UNABLE TO ESTIMATE   Basic Metabolic Panel: Recent Labs  Lab 01/22/19 0415 01/23/19 0329 01/24/19 0142 01/25/19 0210 01/26/19 0248 01/27/19 0211  NA 149* 146* 146* 141 136 136  K 3.4* 3.9 3.2* 3.5 4.7 4.5  CL 112* 115* 115* 112* 110 109  CO2 24 23 22  20* 18* 20*  GLUCOSE 120* 131* 131* 122* 110* 105*  BUN 5* 9 8 8  7* 6*  CREATININE 0.89 1.07* 1.01* 1.01* 0.88 0.95  CALCIUM 10.1 9.5 9.9 9.5 9.7 9.9  MG 2.3  --  2.2 2.1 2.0 2.3  PHOS  --   --   --  3.3 3.4 3.3   GFR: Estimated Creatinine Clearance: 55.7 mL/min (by C-G formula based on SCr of 0.95 mg/dL). Liver Function Tests: Recent Labs  Lab 01/25/19 0210 01/26/19 0248 01/27/19 0211  AST 13* 13* 12*  ALT 13 12 14   ALKPHOS 65 61 62  BILITOT 0.9 0.6 1.0  PROT 6.2* 6.2* 6.3*  ALBUMIN 3.4* 3.4* 3.5   No results for input(s): LIPASE, AMYLASE in the last 168 hours. No results for input(s): AMMONIA in the last 168 hours. Coagulation Profile: No results for input(s): INR, PROTIME in the last 168 hours. Cardiac Enzymes: No results for input(s): CKTOTAL, CKMB,  CKMBINDEX, TROPONINI in the last 168 hours. BNP (last 3 results) No results for input(s): PROBNP in the last 8760 hours. HbA1C: No results for input(s): HGBA1C in the last 72 hours. CBG: No results for input(s): GLUCAP in the last 168 hours. Lipid Profile: No results for input(s): CHOL, HDL, LDLCALC, TRIG, CHOLHDL, LDLDIRECT in the last 72 hours. Thyroid Function Tests: No results for input(s): TSH, T4TOTAL, FREET4, T3FREE, THYROIDAB in the last 72 hours. Anemia Panel: No results for input(s): VITAMINB12, FOLATE, FERRITIN, TIBC, IRON, RETICCTPCT in the last 72 hours. Sepsis Labs: No results for input(s): PROCALCITON, LATICACIDVEN in the last 168 hours.  Recent Results (from the past 240 hour(s))  SARS Coronavirus 2 (CEPHEID - Performed  in [place] lab), Hosp Order     Status: None   Collection Time: 01/19/19  2:47 PM   Specimen: Nasopharyngeal Swab  Result Value Ref Range Status   SARS Coronavirus 2 NEGATIVE NEGATIVE Final    Comment: (NOTE) If result is NEGATIVE SARS-CoV-2 target nucleic acids are NOT DETECTED. The SARS-CoV-2 RNA is generally detectable in upper and lower  respiratory specimens during the acute phase of infection. The lowest  concentration of SARS-CoV-2 viral copies this assay can detect is 250  copies / mL. A negative result does not preclude SARS-CoV-2 infection  and should not be used as the sole basis for treatment or other  patient management decisions.  A negative result may occur with  improper specimen collection / handling, submission of specimen other  than nasopharyngeal swab, presence of viral mutation(s) within the  areas targeted by this assay, and inadequate number of viral copies  (<250 copies / mL). A negative result must be combined with clinical  observations, patient history, and epidemiological information. If result is POSITIVE SARS-CoV-2 target nucleic acids are DETECTED. The SARS-CoV-2 RNA is generally detectable in upper and  lower  respiratory specimens dur ing the acute phase of infection.  Positive  results are indicative of active infection with SARS-CoV-2.  Clinical  correlation with patient history and other diagnostic information is  necessary to determine patient infection status.  Positive results do  not rule out bacterial infection or co-infection with other viruses. If result is PRESUMPTIVE POSTIVE SARS-CoV-2 nucleic acids MAY BE PRESENT.   A presumptive positive result was obtained on the submitted specimen  and confirmed on repeat testing.  While 2019 novel coronavirus  (SARS-CoV-2) nucleic acids may be present in the submitted sample  additional confirmatory testing may be necessary for epidemiological  and / or clinical management purposes  to differentiate between  SARS-CoV-2 and other Sarbecovirus currently known to infect humans.  If clinically indicated additional testing with an alternate test  methodology [phone]) is advised. The SARS-CoV-2 RNA is generally  detectable in upper and lower respiratory sp ecimens during the acute  phase of infection. The expected result is Negative. Fact Sheet for Patients:  StrictlyIdeas.no Fact Sheet for Healthcare Providers: BankingDealers.co.za This test is not yet approved or cleared by the [place] FDA and has been authorized for detection and/or diagnosis of SARS-CoV-2 by FDA under an Emergency Use Authorization (EUA).  This EUA will remain in effect (meaning this test can be used) for the duration of the COVID-19 declaration under Section 564(b)(1) of the Act, 21 U.S.C. section 360bbb-3(b)(1), unless the authorization is terminated or revoked sooner. Performed at [facility], [place] [address], [place], [place] [address]   Surgical pcr screen     Status: None   Collection Time: 01/22/19  9:05 AM   Specimen: Nasal Mucosa; Nasal Swab  Result Value Ref Range Status   MRSA, PCR NEGATIVE  NEGATIVE Final   Staphylococcus aureus NEGATIVE NEGATIVE Final    Comment: (NOTE) The Xpert SA Assay (FDA approved for NASAL specimens in patients 15 years of age and older), is one component of a comprehensive surveillance program. It is not intended to diagnose infection nor to guide or monitor treatment. Performed at [facility], [place] [address] [place], [place] [address]     Radiology Studies: No results found.  Scheduled Meds: . acetaminophen  1,000 mg Oral Q8H  . DULoxetine  60 mg Oral Daily  . enoxaparin (LOVENOX) injection  40 mg Subcutaneous Q24H  . lithium carbonate  600 mg Oral Daily  . pantoprazole  40 mg Oral Daily  . potassium chloride  20 mEq Oral BID   Continuous Infusions:   LOS: 8 days   Kerney Elbe, DO [facility] PAGER is on AMION  If 7PM-7AM, please contact night-coverage [email or web address] Password [facility] 01/27/2019, 11:44 AM

## 2019-01-27 NOTE — Progress Notes (Signed)
5 Days Post-Op    CC: Abdominal pain  Subjective: She is doing better today.  She has had 3 bowel movements.  She still somewhat distended, bowel sounds are still somewhat hypoactive.  Overall she is doing better midline incision has a honeycomb dressing is dry and intact.  Objective: Vital signs in last 24 hours: Temp:  [98.7 F (37.1 C)-99.3 F (37.4 C)] 99.3 F (37.4 C) (08/01 0500) Pulse Rate:  [95-98] 95 (08/01 0500) Resp:  [17-18] 18 (08/01 0500) BP: (103-124)/(76-90) 103/88 (08/01 0500) SpO2:  [97 %-100 %] 99 % (08/01 0500) Last BM Date: 01/26/19 580 p.o. 1500 IV BM x2 Afebrile vital signs are stable blood pressures intermittently elevated. Labs are stable.    Intake/Output from previous day: No intake/output data recorded. Intake/Output this shift: No intake/output data recorded.  General appearance: alert, cooperative and no distress Resp: clear to auscultation bilaterally GI: Still somewhat distended.  Midline incision is okay.  Bowel sounds are present but hypoactive.  She has had 3 bowel movements since yesterday.  Lab Results:  Recent Labs    01/26/19 0248 01/27/19 0211  WBC 8.2 6.7  HGB 10.4* 10.7*  HCT 33.5* 33.5*  PLT 380 PLATELET CLUMPS NOTED ON SMEAR, UNABLE TO ESTIMATE    BMET Recent Labs    01/26/19 0248 01/27/19 0211  NA 136 136  K 4.7 4.5  CL 110 109  CO2 18* 20*  GLUCOSE 110* 105*  BUN 7* 6*  CREATININE 0.88 0.95  CALCIUM 9.7 9.9   PT/INR No results for input(s): LABPROT, INR in the last 72 hours.  Recent Labs  Lab 01/25/19 0210 01/26/19 0248 01/27/19 0211  AST 13* 13* 12*  ALT 13 12 14   ALKPHOS 65 61 62  BILITOT 0.9 0.6 1.0  PROT 6.2* 6.2* 6.3*  ALBUMIN 3.4* 3.4* 3.5     Lipase     Component Value Date/Time   LIPASE 31 01/19/2019 1046     Medications: . DULoxetine  60 mg Oral Daily  . enoxaparin (LOVENOX) injection  40 mg Subcutaneous Q24H  . lithium carbonate  600 mg Oral Daily  . pantoprazole  40 mg Oral  Daily  . potassium chloride  20 mEq Oral BID     Assessment/Plan Dehydration Hypertension Anxiety/depression/Bipolar hx Hyperlipidemia Hypernatremia/hyperchloremia -improved Hypokakemia - replacing K+ COVIDnegative7/24/2020  Small bowel obstruction Post op ileus Diagnostic laparoscopy converted to exploratory laparotomy, lysis of adhesions x1 hour, 01/22/2019 Dr. Randall Hiss WilsonPOD# 5 Findings: (numerous interloop adhesions;small bowel to chronic areas of obstruction x 2)  FEN: clear liquids, IV's are out?  >> full liquids today ID: Preop Cipro/Flagyl DVT: Lovenox Follow-up: Dr. Redmond Pulling POC: [name]   [phone]   Plan: Advance to full liquids today continue to mobilize.      LOS: 8 days    [name] 01/27/2019 [id number]

## 2019-01-28 LAB — CBC WITH DIFFERENTIAL/PLATELET
Abs Immature Granulocytes: 0.03 10*3/uL (ref 0.00–0.07)
Basophils Absolute: 0 10*3/uL (ref 0.0–0.1)
Basophils Relative: 0 %
Eosinophils Absolute: 0.3 10*3/uL (ref 0.0–0.5)
Eosinophils Relative: 5 %
HCT: 32.8 % — ABNORMAL LOW (ref 36.0–46.0)
Hemoglobin: 10.6 g/dL — ABNORMAL LOW (ref 12.0–15.0)
Immature Granulocytes: 0 %
Lymphocytes Relative: 13 %
Lymphs Abs: 0.9 10*3/uL (ref 0.7–4.0)
MCH: 29.1 pg (ref 26.0–34.0)
MCHC: 32.3 g/dL (ref 30.0–36.0)
MCV: 90.1 fL (ref 80.0–100.0)
Monocytes Absolute: 0.4 10*3/uL (ref 0.1–1.0)
Monocytes Relative: 6 %
Neutro Abs: 5.3 10*3/uL (ref 1.7–7.7)
Neutrophils Relative %: 76 %
Platelets: 367 10*3/uL (ref 150–400)
RBC: 3.64 MIL/uL — ABNORMAL LOW (ref 3.87–5.11)
RDW: 16.9 % — ABNORMAL HIGH (ref 11.5–15.5)
WBC: 7 10*3/uL (ref 4.0–10.5)
nRBC: 0 % (ref 0.0–0.2)

## 2019-01-28 LAB — COMPREHENSIVE METABOLIC PANEL
ALT: 18 U/L (ref 0–44)
AST: 17 U/L (ref 15–41)
Albumin: 3.6 g/dL (ref 3.5–5.0)
Alkaline Phosphatase: 64 U/L (ref 38–126)
Anion gap: 8 (ref 5–15)
BUN: 8 mg/dL (ref 8–23)
CO2: 18 mmol/L — ABNORMAL LOW (ref 22–32)
Calcium: 10.4 mg/dL — ABNORMAL HIGH (ref 8.9–10.3)
Chloride: 109 mmol/L (ref 98–111)
Creatinine, Ser: 0.93 mg/dL (ref 0.44–1.00)
GFR calc Af Amer: >60 mL/min (ref >60)
GFR calc non Af Amer: >60 mL/min (ref >60)
Glucose, Bld: 105 mg/dL — ABNORMAL HIGH (ref 70–99)
Potassium: 4.7 mmol/L (ref 3.5–5.1)
Sodium: 135 mmol/L (ref 135–145)
Total Bilirubin: 0.9 mg/dL (ref 0.3–1.2)
Total Protein: 6.6 g/dL (ref 6.5–8.1)

## 2019-01-28 LAB — MAGNESIUM: Magnesium: 2.5 mg/dL — ABNORMAL HIGH (ref 1.7–2.4)

## 2019-01-28 LAB — PHOSPHORUS: Phosphorus: 4 mg/dL (ref 2.5–4.6)

## 2019-01-28 MED ORDER — SODIUM CHLORIDE 0.9 % IV SOLN
INTRAVENOUS | Status: DC
  Administered 2019-01-28: 09:00:00 via INTRAVENOUS

## 2019-01-28 NOTE — Discharge Instructions (Signed)
[facility]      [facility] °[phone] ° °OPEN ABDOMINAL SURGERY: POST OP INSTRUCTIONS ° °Always review your discharge instruction sheet given to you by the facility where your surgery was performed. ° °IF YOU HAVE DISABILITY OR FAMILY LEAVE FORMS, YOU MUST BRING THEM TO THE OFFICE FOR PROCESSING.  PLEASE DO NOT GIVE THEM TO YOUR DOCTOR. ° °1. A prescription for pain medication may be given to you upon discharge.  Take your pain medication as prescribed, if needed.  If narcotic pain medicine is not needed, then you may take acetaminophen (Tylenol) or ibuprofen (Advil) as needed. °2. Take your usually prescribed medications unless otherwise directed. °3. If you need a refill on your pain medication, please contact your pharmacy. They will contact our office to request authorization.  Prescriptions will not be filled after 5pm or on week-ends. °4. You should follow a light diet the first few days after arrival home, such as soup and crackers, pudding, etc.unless your doctor has advised otherwise. A high-fiber, low fat diet can be resumed as tolerated.   Be sure to include lots of fluids daily. Most patients will experience some swelling and bruising on the chest and neck area.  Ice packs will help.  Swelling and bruising can take several days to resolve °5. Most patients will experience some swelling and bruising in the area of the incision. Ice pack will help. Swelling and bruising can take several days to resolve..  °6. It is common to experience some constipation if taking pain medication after surgery.  Increasing fluid intake and taking a stool softener will usually help or prevent this problem from occurring.  A mild laxative (Milk of Magnesia or Miralax) should be taken according to package directions if there are no bowel movements after 48 hours. °7.  You may have steri-strips (small skin tapes) in place directly over the incision.  These strips should be left on the skin for 7-10 days.  If your  surgeon used skin glue on the incision, you may shower in 24 hours.  The glue will flake off over the next 2-3 weeks.  Any sutures or staples will be removed at the office during your follow-up visit. You may find that a light gauze bandage over your incision may keep your staples from being rubbed or pulled. You may shower and replace the bandage daily. °8. ACTIVITIES:  You may resume regular (light) daily activities beginning the next day--such as daily self-care, walking, climbing stairs--gradually increasing activities as tolerated.  You may have sexual intercourse when it is comfortable.  Refrain from any heavy lifting or straining until approved by your doctor. °a. You may drive when you no longer are taking prescription pain medication, you can comfortably wear a seatbelt, and you can safely maneuver your car and apply brakes °b. Return to Work: ___________________________________ °9. You should see your doctor in the office for a follow-up appointment approximately two weeks after your surgery.  Make sure that you call for this appointment within a day or two after you arrive home to insure a convenient appointment time. °OTHER INSTRUCTIONS:  °_____________________________________________________________ °_____________________________________________________________ ° °WHEN TO CALL YOUR DOCTOR: °1. Fever over 101.0 °2. Inability to urinate °3. Nausea and/or vomiting °4. Extreme swelling or bruising °5. Continued bleeding from incision. °6. Increased pain, redness, or drainage from the incision. °7. Difficulty swallowing or breathing °8. Muscle cramping or spasms. °9. Numbness or tingling in hands or feet or around lips. ° °The clinic staff is available to   answer your questions during regular business hours.  Please dont hesitate to call and ask to speak to one of the nurses if you have concerns.  For further questions, please visit [email or web address]    Soft-Food Eating Plan for at least  the next week A soft-food eating plan includes foods that are safe and easy to chew and swallow. Your health care provider or dietitian can help you find foods and flavors that fit into this plan. Follow this plan until your health care provider or dietitian says it is safe to start eating other foods and food textures. What are tips for following this plan? General guidelines   Take small bites of food, or cut food into pieces about  inch or smaller. Bite-sized pieces of food are easier to chew and swallow.  Eat moist foods. Avoid overly dry foods.  Avoid foods that: ? Are difficult to swallow, such as dry, chunky, crispy, or sticky foods. ? Are difficult to chew, such as hard, tough, or stringy foods. ? Contain nuts, seeds, or fruits.  Follow instructions from your dietitian about the types of liquids that are safe for you to swallow. You may be allowed to have: ? Thick liquids only. This includes only liquids that are thicker than honey. ? Thin and thick liquids. This includes all beverages and foods that become liquid at room temperature.  To make thick liquids: ? Purchase a commercial liquid thickening powder. These are available at grocery stores and pharmacies. ? Mix the thickener into liquids according to instructions on the label. ? Purchase ready-made thickened liquids. ? Thicken soup by pureeing, straining to remove chunks, and adding flour, potato flakes, or corn starch. ? Add commercial thickener to foods that become liquid at room temperature, such as milk shakes, yogurt, ice cream, gelatin, and sherbet.  Ask your health care provider whether you need to take a fiber supplement. Cooking  Cook meats so they stay tender and moist. Use methods like braising, stewing, or baking in liquid.  Cook vegetables and fruit until they are soft enough to be mashed with a fork.  Peel soft, fresh fruits such as peaches, nectarines, and melons.  When making soup, make sure chunks of  meat and vegetables are smaller than  inch.  Reheat leftover foods slowly so that a tough crust does not form. What foods are allowed? The items listed below may not be a complete list. Talk with your dietitian about what dietary choices are best for you. Grains Breads, muffins, pancakes, or waffles moistened with syrup, jelly, or butter. Dry cereals well-moistened with milk. Moist, cooked cereals. Well-cooked pasta and rice. Vegetables All soft-cooked vegetables. Shredded lettuce. Fruits All canned and cooked fruits. Soft, peeled fresh fruits. Strawberries. Dairy Milk. Cream. Yogurt. Cottage cheese. Soft cheese without the rind. Meats and other protein foods Tender, moist ground meat, poultry, or fish. Meat cooked in gravy or sauces. Eggs. Sweets and desserts Ice cream. Milk shakes. Sherbet. Pudding. Fats and oils Butter. Margarine. Olive, canola, sunflower, and grapeseed oil. Smooth salad dressing. Smooth cream cheese. Mayonnaise. Gravy. What foods are not allowed? The items listed bemay not be a complete list. Talk with your dietitian about what dietary choices are best for you. Grains Coarse or dry cereals, such as bran, granola, and shredded wheat. Tough or chewy crusty breads, such as [place] bread or baguettes. Breads with nuts, seeds, or fruit. Vegetables All raw vegetables. Cooked corn. Cooked vegetables that are tough or stringy. Tough, crisp, fried potatoes and potato skins.  Fruits Fresh fruits with skins or seeds, or both, such as apples, pears, and grapes. Stringy, high-pulp fruits, such as papaya, pineapple, coconut, and mango. Fruit leather and all dried fruit. Dairy Yogurt with nuts or coconut. Meats and other protein foods Hard, dry sausages. Dry meat, poultry, or fish. Meats with gristle. Fish with bones. Fried meat or fish. Lunch meat and hotdogs. Nuts and seeds. Chunky peanut butter or other nut butters. Sweets and desserts Cakes or cookies that are very dry or  chewy. Desserts with dried fruit, nuts, or coconut. Fried pastries. Very rich pastries. Fats and oils Cream cheese with fruit or nuts. Salad dressings with seeds or chunks. Summary  A soft-food eating plan includes foods that are safe and easy to swallow. Generally, the foods should be soft enough to be mashed with a fork.  Avoid foods that are dry, hard to chew, crunchy, sticky, stringy, or crispy.  Ask your health care provider whether you need to thicken your liquids and if you need to take a fiber supplement. This information is not intended to replace advice given to you by your health care provider. Make sure you discuss any questions you have with your health care provider. Document Released: 09/21/2007 Document Revised: 10/05/2018 Document Reviewed: 08/17/2016 Elsevier Patient Education  2020 [employer].

## 2019-01-28 NOTE — Progress Notes (Signed)
6 Days Post-Op    CC: Abdominal pain  Subjective: She is tolerating full liquids well she had bowel movement yesterday.  She still seems a bit distended but she has not having any significant discomfort.  Her midline incision looks good I took off the honeycomb dressing this morning.  Objective: Vital signs in last 24 hours: Temp:  [97.6 F (36.4 C)-98.9 F (37.2 C)] 97.6 F (36.4 C) (08/02 0548) Pulse Rate:  [94-99] 94 (08/02 0548) Resp:  [14-18] 14 (08/02 0548) BP: (106-117)/(71-78) 106/78 (08/02 0548) SpO2:  [98 %-100 %] 100 % (08/02 0548) Last BM Date: 01/27/19 NO I/O recorded Afebrile, VSS Labs OK  Intake/Output from previous day: No intake/output data recorded. Intake/Output this shift: No intake/output data recorded.  General appearance: alert, cooperative and no distress Resp: clear to auscultation bilaterally GI: Still distended, positive bowel sounds, positive BM.  Midline incision looks good.  Lab Results:  Recent Labs    01/27/19 0211 01/28/19 0356  WBC 6.7 7.0  HGB 10.7* 10.6*  HCT 33.5* 32.8*  PLT PLATELET CLUMPS NOTED ON SMEAR, UNABLE TO ESTIMATE 367    BMET Recent Labs    01/27/19 0211 01/28/19 0356  NA 136 135  K 4.5 4.7  CL 109 109  CO2 20* 18*  GLUCOSE 105* 105*  BUN 6* 8  CREATININE 0.95 0.93  CALCIUM 9.9 10.4*   PT/INR No results for input(s): LABPROT, INR in the last 72 hours.  Recent Labs  Lab 01/25/19 0210 01/26/19 0248 01/27/19 0211 01/28/19 0356  AST 13* 13* 12* 17  ALT 13 12 14 18   ALKPHOS 65 61 62 64  BILITOT 0.9 0.6 1.0 0.9  PROT 6.2* 6.2* 6.3* 6.6  ALBUMIN 3.4* 3.4* 3.5 3.6     Lipase     Component Value Date/Time   LIPASE 31 01/19/2019 1046     Medications: . acetaminophen  1,000 mg Oral Q8H  . atorvastatin  10 mg Oral Daily  . DULoxetine  60 mg Oral Daily  . enoxaparin (LOVENOX) injection  40 mg Subcutaneous Q24H  . lithium carbonate  600 mg Oral Daily  . pantoprazole  40 mg Oral Daily  . potassium  chloride  20 mEq Oral BID    Assessment/Plan Dehydration Hypertension Anxiety/depression/Bipolar hx Hyperlipidemia Hypernatremia/hyperchloremia -improved Hypokakemia - replacing K+ COVIDnegative7/24/2020  Small bowel obstruction Post op ileus Diagnostic laparoscopy converted to exploratory laparotomy, lysis of adhesions x1 hour, 01/22/2019 Dr. Ellen Henri Findings: (numerous interloop adhesions;small bowel to chronic areas of obstruction x 2)  FEN:  full liquids >> soft diet ID: Preop Cipro/Flagyl DVT: Lovenox Follow-up: Dr. Redmond Pulling POC: [name]   [phone]   Plan: Advance to a soft diet see how she does.  If she continues to do well we will aim for discharge tomorrow.        LOS: 9 days    Ajahni Nay 01/28/2019 [phone]

## 2019-01-28 NOTE — Progress Notes (Signed)
PROGRESS NOTE    [name]  SFK:[id number] DOB: 12/11/1950 DOA: 01/19/2019 PCP: Gregor Hams, FNP  Brief Narrative:  [name] is a 68 y.o. AAfemalewith medical history significant for but not limited togynecological abdominal surgeries x2, essential hypertension, chronic depression/anxiety/bipolar disorder as well as others who presented to [facility] ED with complaints of severe abdominal pain associated with nausea and vomiting x4 days. CT abdomen and pelvis with contrast revealed partial small bowel obstruction.  She underwent a diagnostic laparoscopic and converted to exploratory laparotomy as well as adhesional lysis for least 1 hour.  She is postoperative day 3 and now has a postoperative ileus.  Currently she is mobilizing but still not passing any flatus or having a bowel movement.  General Surgery tried a clamping trial and removed the NG tube .  She was started on a clear liquid diet and continues to mobilize and is having bowel movements and passing flatus.  Abdomen still remains distended but not as much and General Surgery started the patient on full liquid diets and has now advanced to soft.  Pending her tolerance of a soft diet she will likely be discharged tomorrow.  Assessment & Plan:   Principal Problem:   SBO (small bowel obstruction) (HCC) Active Problems:   HTN (hypertension)   HLD (hyperlipidemia)   Prediabetes   Normocytic anemia   Dehydration   Hypernatremia   Hyperchloremia   Renal insufficiency   Obesity (BMI 30-39.9)   SBO (small bowel obstruction) status post exploratory laparotomy and extensive lysis of adhesions POD 6 done by Dr. Redmond Pulling -s/p open ex laparotomy and extensive lysis of adhesions -Per general surgery, NG tube is been removed and they have placed the patient on a Clear Liquid Diet and she is tolerating sips of clears however ileus is slowly improving; clear liquid diet with advance to full liquid diet and now further advanced to a  soft diet by general surgery -Continue to ambulate and IS -Discontinued IVF with D5W + 40 mEQ KCl at 100 mL/hr given that her potassium level was elevating and that she is now becoming slightly metabolic acidotic; acidosis was improving but slightly became acidotic again restarted fluids at 75 mL's per hour with normal saline -Replace potassium to a level 4 and replace magnesium to a level of 2 ideally -Per general surgery, IV Acetaminophen has now stopped -C/w IV methocarbamol thousand grams every 8 hours scheduled along with morphine IV 2 mg every 4 PRN for severe pain -Appreciate further surgical recommendations and they feel that her ileus is slowly improving.  If no significant improvement (less distention, more bowel movements and flatus) in the next 24 to 36 hours they recommend TPN and this is been discussed with the patient -Prealbumin was 18.9 -She is tolerated full liquid diet and is improving.  Abdomen is still distended and surgery has advance diet to soft diet currently.  Dehydration, Hypernatremia, Hyperchloremia, improved Elevation in Cr/Renal Insuffiencey; improved Mild Metabolic Acidosis, slightly worsened in the setting of D5W and now restarted normal saline rate 75 mL's -Urine output improved after increasing IVF  -Na+ is trending down and is improved 135 -Chloride is trending down and is now 109 -CO2 is now 18 and anion gap is 8 -Patient's BUN/creatinine is now 8/0.93 -Her IVF was switched to D5W on 7/27 due to rising sodium and chloride and had been stopped but will resume sodium chloride at 75 mL's per hour given her hypercalcemia and mild metabolic acidosis -Discontinued D5W for now  and cont to follow electrolytes and I and O -Continue to monitor and trend renal function and repeat CMP in a.m.  Normocytic Anemia -Hemoglobin/hematocrit is now stable at 10.6/32.8 -?  Drop due to acute illness and IVF hydration; IV fluids have now been stopped -Checked Anemia Panel and  showed iron level of 13, U IBC of 284, TIBC of 297, saturation ratios of 4%, ferritin of 56, folate level 5.2, vitamin B12 level of 188 -Continue to monitor for signs and symptoms of bleeding; currently no overt bleeding noted -Repeat CBC in a.m.  Burning Eyes -Possibly dry eyes -Naphazoline-Glycerin 1-2 Drops Both Eyes have helped  Hyperglycemia the setting of Pre-Diabetes -Likely in the setting of D5W but blood sugars have ranged from 105-137 on daily BMPs/CMP's ; this AM was again 105 -Recent Hemoglobin A1c was 6.3 on 01/19/2019 -Continue monitor blood sugars carefully and if necessary will need to place on sensitive along sliding scale AC  HTN (Hypertension) -She takes Amlodipine at at home which is on hold until blood pressure improves  -Blood pressures been low regardless and has been 106/78 -C/w Labetalol 10 mg q6hprn for SBP>170  HLD (Hyperlipidemia) -We will resume atorvastatin 10 mg p.o. daily now  Hypokalemia -Patient's K+ this AM was 4.7 -IV fluid with D5W is now been stopped and restarted IV fluids with normal saline rate of 75 mL's per hour -Mag level was 2.5 -Continue to Monitor and Replete as Necessary -Repeat CMP in AM   Obesity -Estimated body mass index is 33.35 kg/m as calculated from the following:   Height as of this encounter: 5\' 2"  (1.575 m).   Weight as of this encounter: 82.7 kg. -Weight Loss and Dietary Counseling given   Anxiety, Depression, Bipolar Disorder -Resumed Duloxetine 60 mg p.o. daily but was stopped by Surgery -C/w lithium carbonate 600 mg p.o. daily  DVT prophylaxis: Enoxaparin 40 mg q24h Code Status: FULL CODE Family Communication: No family present at bedside  Disposition Plan: Pending Improvement of Ileus and tolerance of Diet and per Surgical Clearance; anticipate being discharged in the next 24 to 48 hours when ileus resolves and she is tolerating a diet without issues and having bowel movements and decreased distention of  abdomen and likely this will be tomorrow  Consultants:   General Surgery   Procedures:  Diagnostic laparoscopy converted to exploratory laparotomy, adhesiolysis for one hour done by Dr. Redmond Pulling on 01/22/2019  Antimicrobials:  Anti-infectives (From admission, onward)   Start     Dose/Rate Route Frequency Ordered Stop   01/22/19 0845  ciprofloxacin (CIPRO) IVPB 400 mg     400 mg 200 mL/hr over 60 Minutes Intravenous  Once 01/22/19 0841 01/22/19 1310   01/22/19 0845  metroNIDAZOLE (FLAGYL) IVPB 1,000 mg 200 mL     1,000 mg 200 mL/hr over 60 Minutes Intravenous  Once 01/22/19 0841 01/22/19 1313     Subjective: Patient was seen and examined at bedside and felt well.  Passing a lot of flatus but has not had a bowel movement this morning.  No chest pain, lightheadedness or dizziness.  No other concerns or complaints at this time and excited about the possibility of going home soon.  Objective: Vitals:   01/27/19 0500 01/27/19 1415 01/27/19 2036 01/28/19 0548  BP: 103/88 113/77 117/71 106/78  Pulse: 95 99 99 94  Resp: 18 18 16 14   Temp: 99.3 F (37.4 C) 98.9 F (37.2 C) 98.8 F (37.1 C) 97.6 F (36.4 C)  TempSrc: Oral Oral Oral Oral  SpO2: 99% 98% 99% 100%  Weight:      Height:       No intake or output data in the 24 hours ending 01/28/19 1040 Filed Weights   01/19/19 1042 01/19/19 1725  Weight: 83.9 kg 82.7 kg   Examination: Physical Exam:  Constitutional: Well-nourished, well-developed obese African-American female currently no acute distress and she is doing better and tolerated full diet and abdominal distention is improving slightly Eyes: Lids and conjunctive are normal.  Sclera anicteric ENMT: External ears nose appear normal.  Grossly normal hearing.  Mucous members are moist Neck: Appears supple no JVD Respiratory: Diminished auscultation bilaterally no appreciable wheezing, rales, rhonchi.  Patient not tachypneic or using any accessory muscles to breathe  Cardiovascular: Regular rate and rhythm.  No appreciable murmurs, rubs, gallops.  Has very mild lower extremity edema Abdomen: Soft, mildly tender to palpate.  Remains distended but not as much as yesterday.  Bowel sounds are present and slightly improved GU:. Deferred Musculoskeletal: No contractures or cyanosis.  No joint deformities noted Skin: Skin is warm and dry.  No appreciable rashes or lesions on physical evaluation abdominal dressing with honeycomb was removed.  Abdominal incisions appear clean dry intact and she has some staples Neurologic: Cranial nerves II through XII grossly intact no appreciable focal deficits.  Romberg sign cerebellar reflexes plus Psychiatric: Normal and pleasant mood and affect.  Intact judgment and insight.  Patient is awake, alert, oriented x3  Data Reviewed: I have personally reviewed following labs and imaging studies  CBC: Recent Labs  Lab 01/24/19 0142 01/25/19 0210 01/26/19 0248 01/27/19 0211 01/28/19 0356  WBC 9.4 8.3 8.2 6.7 7.0  NEUTROABS  --  6.1 6.8 5.0 5.3  HGB 10.2* 10.7* 10.4* 10.7* 10.6*  HCT 32.4* 34.1* 33.5* 33.5* 32.8*  MCV 91.3 91.9 92.5 89.6 90.1  PLT 392 380 380 PLATELET CLUMPS NOTED ON SMEAR, UNABLE TO ESTIMATE 629   Basic Metabolic Panel: Recent Labs  Lab 01/24/19 0142 01/25/19 0210 01/26/19 0248 01/27/19 0211 01/28/19 0356  NA 146* 141 136 136 135  K 3.2* 3.5 4.7 4.5 4.7  CL 115* 112* 110 109 109  CO2 22 20* 18* 20* 18*  GLUCOSE 131* 122* 110* 105* 105*  BUN 8 8 7* 6* 8  CREATININE 1.01* 1.01* 0.88 0.95 0.93  CALCIUM 9.9 9.5 9.7 9.9 10.4*  MG 2.2 2.1 2.0 2.3 2.5*  PHOS  --  3.3 3.4 3.3 4.0   GFR: Estimated Creatinine Clearance: 56.9 mL/min (by C-G formula based on SCr of 0.93 mg/dL). Liver Function Tests: Recent Labs  Lab 01/25/19 0210 01/26/19 0248 01/27/19 0211 01/28/19 0356  AST 13* 13* 12* 17  ALT 13 12 14 18   ALKPHOS 65 61 62 64  BILITOT 0.9 0.6 1.0 0.9  PROT 6.2* 6.2* 6.3* 6.6  ALBUMIN 3.4*  3.4* 3.5 3.6   No results for input(s): LIPASE, AMYLASE in the last 168 hours. No results for input(s): AMMONIA in the last 168 hours. Coagulation Profile: No results for input(s): INR, PROTIME in the last 168 hours. Cardiac Enzymes: No results for input(s): CKTOTAL, CKMB, CKMBINDEX, TROPONINI in the last 168 hours. BNP (last 3 results) No results for input(s): PROBNP in the last 8760 hours. HbA1C: No results for input(s): HGBA1C in the last 72 hours. CBG: No results for input(s): GLUCAP in the last 168 hours. Lipid Profile: No results for input(s): CHOL, HDL, LDLCALC, TRIG, CHOLHDL, LDLDIRECT in the last 72 hours. Thyroid Function Tests: No results for input(s): TSH,  T4TOTAL, FREET4, T3FREE, THYROIDAB in the last 72 hours. Anemia Panel: No results for input(s): VITAMINB12, FOLATE, FERRITIN, TIBC, IRON, RETICCTPCT in the last 72 hours. Sepsis Labs: No results for input(s): PROCALCITON, LATICACIDVEN in the last 168 hours.  Recent Results (from the past 240 hour(s))  SARS Coronavirus 2 (CEPHEID - Performed in [place] hospital lab), Hosp Order     Status: None   Collection Time: 01/19/19  2:47 PM   Specimen: Nasopharyngeal Swab  Result Value Ref Range Status   SARS Coronavirus 2 NEGATIVE NEGATIVE Final    Comment: (NOTE) If result is NEGATIVE SARS-CoV-2 target nucleic acids are NOT DETECTED. The SARS-CoV-2 RNA is generally detectable in upper and lower  respiratory specimens during the acute phase of infection. The lowest  concentration of SARS-CoV-2 viral copies this assay can detect is 250  copies / mL. A negative result does not preclude SARS-CoV-2 infection  and should not be used as the sole basis for treatment or other  patient management decisions.  A negative result may occur with  improper specimen collection / handling, submission of specimen other  than nasopharyngeal swab, presence of viral mutation(s) within the  areas targeted by this assay, and inadequate  number of viral copies  (<250 copies / mL). A negative result must be combined with clinical  observations, patient history, and epidemiological information. If result is POSITIVE SARS-CoV-2 target nucleic acids are DETECTED. The SARS-CoV-2 RNA is generally detectable in upper and lower  respiratory specimens dur ing the acute phase of infection.  Positive  results are indicative of active infection with SARS-CoV-2.  Clinical  correlation with patient history and other diagnostic information is  necessary to determine patient infection status.  Positive results do  not rule out bacterial infection or co-infection with other viruses. If result is PRESUMPTIVE POSTIVE SARS-CoV-2 nucleic acids MAY BE PRESENT.   A presumptive positive result was obtained on the submitted specimen  and confirmed on repeat testing.  While 2019 novel coronavirus  (SARS-CoV-2) nucleic acids may be present in the submitted sample  additional confirmatory testing may be necessary for epidemiological  and / or clinical management purposes  to differentiate between  SARS-CoV-2 and other Sarbecovirus currently known to infect humans.  If clinically indicated additional testing with an alternate test  methodology [phone]) is advised. The SARS-CoV-2 RNA is generally  detectable in upper and lower respiratory sp ecimens during the acute  phase of infection. The expected result is Negative. Fact Sheet for Patients:  StrictlyIdeas.no Fact Sheet for Healthcare Providers: BankingDealers.co.za This test is not yet approved or cleared by the [place] FDA and has been authorized for detection and/or diagnosis of SARS-CoV-2 by FDA under an Emergency Use Authorization (EUA).  This EUA will remain in effect (meaning this test can be used) for the duration of the COVID-19 declaration under Section 564(b)(1) of the Act, 21 U.S.C. section 360bbb-3(b)(1), unless the  authorization is terminated or revoked sooner. Performed at [facility], [place] [address], [place], [place] [address]   Surgical pcr screen     Status: None   Collection Time: 01/22/19  9:05 AM   Specimen: Nasal Mucosa; Nasal Swab  Result Value Ref Range Status   MRSA, PCR NEGATIVE NEGATIVE Final   Staphylococcus aureus NEGATIVE NEGATIVE Final    Comment: (NOTE) The Xpert SA Assay (FDA approved for NASAL specimens in patients 3 years of age and older), is one component of a comprehensive surveillance program. It is not intended to  diagnose infection nor to guide or monitor treatment. Performed at [facility], [place] [address], [place], [place] [address]     Radiology Studies: No results found.  Scheduled Meds: . acetaminophen  1,000 mg Oral Q8H  . atorvastatin  10 mg Oral Daily  . DULoxetine  60 mg Oral Daily  . enoxaparin (LOVENOX) injection  40 mg Subcutaneous Q24H  . lithium carbonate  600 mg Oral Daily  . pantoprazole  40 mg Oral Daily  . potassium chloride  20 mEq Oral BID   Continuous Infusions: . sodium chloride 75 mL/hr at 01/28/19 0906    LOS: 9 days   Kerney Elbe, DO [facility] PAGER is on [place]  If 7PM-7AM, please contact night-coverage [email or web address] Password [email or web address] 01/28/2019, 10:40 AM

## 2019-01-29 LAB — COMPREHENSIVE METABOLIC PANEL
ALT: 25 U/L (ref 0–44)
AST: 25 U/L (ref 15–41)
Albumin: 3.6 g/dL (ref 3.5–5.0)
Alkaline Phosphatase: 73 U/L (ref 38–126)
Anion gap: 8 (ref 5–15)
BUN: 9 mg/dL (ref 8–23)
CO2: 18 mmol/L — ABNORMAL LOW (ref 22–32)
Calcium: 10.4 mg/dL — ABNORMAL HIGH (ref 8.9–10.3)
Chloride: 109 mmol/L (ref 98–111)
Creatinine, Ser: 0.93 mg/dL (ref 0.44–1.00)
GFR calc Af Amer: >60 mL/min (ref >60)
GFR calc non Af Amer: >60 mL/min (ref >60)
Glucose, Bld: 98 mg/dL (ref 70–99)
Potassium: 4.7 mmol/L (ref 3.5–5.1)
Sodium: 135 mmol/L (ref 135–145)
Total Bilirubin: 0.9 mg/dL (ref 0.3–1.2)
Total Protein: 6.7 g/dL (ref 6.5–8.1)

## 2019-01-29 LAB — PHOSPHORUS: Phosphorus: 3.8 mg/dL (ref 2.5–4.6)

## 2019-01-29 LAB — CBC WITH DIFFERENTIAL/PLATELET
Abs Immature Granulocytes: 0.03 10*3/uL (ref 0.00–0.07)
Basophils Absolute: 0 10*3/uL (ref 0.0–0.1)
Basophils Relative: 0 %
Eosinophils Absolute: 0.2 10*3/uL (ref 0.0–0.5)
Eosinophils Relative: 3 %
HCT: 34 % — ABNORMAL LOW (ref 36.0–46.0)
Hemoglobin: 10.8 g/dL — ABNORMAL LOW (ref 12.0–15.0)
Immature Granulocytes: 0 %
Lymphocytes Relative: 15 %
Lymphs Abs: 1.1 10*3/uL (ref 0.7–4.0)
MCH: 28.7 pg (ref 26.0–34.0)
MCHC: 31.8 g/dL (ref 30.0–36.0)
MCV: 90.4 fL (ref 80.0–100.0)
Monocytes Absolute: 0.5 10*3/uL (ref 0.1–1.0)
Monocytes Relative: 7 %
Neutro Abs: 5.4 10*3/uL (ref 1.7–7.7)
Neutrophils Relative %: 75 %
Platelets: 380 10*3/uL (ref 150–400)
RBC: 3.76 MIL/uL — ABNORMAL LOW (ref 3.87–5.11)
RDW: 16.7 % — ABNORMAL HIGH (ref 11.5–15.5)
WBC: 7.2 10*3/uL (ref 4.0–10.5)
nRBC: 0 % (ref 0.0–0.2)

## 2019-01-29 LAB — MAGNESIUM: Magnesium: 2.3 mg/dL (ref 1.7–2.4)

## 2019-01-29 MED ORDER — METHOCARBAMOL 500 MG PO TABS: 500.0000 mg | ORAL_TABLET | Freq: Three times a day (TID) | ORAL | 0 refills | Status: DC | PRN

## 2019-01-29 MED ORDER — TRAMADOL HCL 50 MG PO TABS: 50.0000 mg | ORAL_TABLET | Freq: Four times a day (QID) | ORAL | 0 refills | Status: DC | PRN

## 2019-01-29 MED ORDER — PANTOPRAZOLE SODIUM 40 MG PO TBEC: 40.0000 mg | DELAYED_RELEASE_TABLET | Freq: Every day | ORAL | 0 refills | Status: AC

## 2019-01-29 MED ORDER — ACETAMINOPHEN 500 MG PO TABS: ORAL_TABLET | ORAL | 0 refills | Status: AC

## 2019-01-29 NOTE — Progress Notes (Signed)
7 Days Post-Op    CC:  Subjective: She is tolerating a soft diet.  She had 2 bowel movements yesterday.  Her midline incision and port sites look fine.  She has been up walking and is doing neck comfortably.  Objective: Vital signs in last 24 hours: Temp:  [98.6 F (37 C)-99 F (37.2 C)] 98.8 F (37.1 C) (08/03 0326) Pulse Rate:  [93-100] 98 (08/03 0326) Resp:  [17-18] 18 (08/03 0326) BP: (111-123)/(75-85) 119/75 (08/03 0326) SpO2:  [97 %-100 %] 100 % (08/03 0326) Last BM Date: 01/28/19 958 p.o. 500 IV BM x2 Afebrile vital signs are stable Labs are stable. Intake/Output from previous day: 08/02 0701 - 08/03 0700 In: 1505.3 [P.O.:958; I.V.:547.3] Out: -  Intake/Output this shift: No intake/output data recorded.  General appearance: alert, cooperative and no distress Resp: clear to auscultation bilaterally GI: Abdomen still mildly distended but she is tolerating her diet well and having 2 bowel movements yesterday.  She is pretty comfortable.  Her midline incision looks good.  We will get the staples out in the office.  Lab Results:  Recent Labs    01/28/19 0356 01/29/19 0315  WBC 7.0 7.2  HGB 10.6* 10.8*  HCT 32.8* 34.0*  PLT 367 380    BMET Recent Labs    01/28/19 0356 01/29/19 0315  NA 135 135  K 4.7 4.7  CL 109 109  CO2 18* 18*  GLUCOSE 105* 98  BUN 8 9  CREATININE 0.93 0.93  CALCIUM 10.4* 10.4*   PT/INR No results for input(s): LABPROT, INR in the last 72 hours.  Recent Labs  Lab 01/25/19 0210 01/26/19 0248 01/27/19 0211 01/28/19 0356 01/29/19 0315  AST 13* 13* 12* 17 25  ALT 13 12 14 18 25   ALKPHOS 65 61 62 64 73  BILITOT 0.9 0.6 1.0 0.9 0.9  PROT 6.2* 6.2* 6.3* 6.6 6.7  ALBUMIN 3.4* 3.4* 3.5 3.6 3.6     Lipase     Component Value Date/Time   LIPASE 31 01/19/2019 1046     Medications: . acetaminophen  1,000 mg Oral Q8H  . atorvastatin  10 mg Oral Daily  . DULoxetine  60 mg Oral Daily  . enoxaparin (LOVENOX) injection  40 mg  Subcutaneous Q24H  . lithium carbonate  600 mg Oral Daily  . pantoprazole  40 mg Oral Daily  . potassium chloride  20 mEq Oral BID    Assessment/Plan Dehydration Hypertension Anxiety/depression/Bipolar hx Hyperlipidemia Hypernatremia/hyperchloremia -improved Hypokakemia - replacing K+ COVIDnegative7/24/2020  Small bowel obstruction Post op ileus Diagnostic laparoscopy converted to exploratory laparotomy, lysis of adhesions x1 hour, 01/22/2019 Dr. Steffanie Dunn Findings: (numerous interloop adhesions;small bowel to chronic areas of obstruction x 2)  JME:QAST liquids >> soft diet ID: Preop Cipro/Flagyl DVT: Lovenox Follow-up: Dr. Redmond Pulling POC: [name]   [phone]    Plan: Okay to discharge from our standpoint.  I will get her follow-up instructions and appointments and place in the AVS.     LOS: 10 days    [name] 01/29/2019 [phone]

## 2019-01-29 NOTE — Care Management Important Message (Signed)
Important Message  Patient Details  Name: DARBI CHANDRAN MRN: [id number] Date of Birth: 09/09/1950   Medicare Important Message Given:  Yes     Shelda Altes 01/29/2019, 4:18 PM

## 2019-01-29 NOTE — Discharge Summary (Signed)
Physician Discharge Summary  [name] VHQ:[id number] DOB: 12-30-1950 DOA: 01/19/2019  PCP: Gregor Hams, FNP  Admit date: 01/19/2019 Discharge date: 01/29/2019  Admitted From: Home Disposition: Home  Recommendations for Outpatient Follow-up:  1. Follow up with PCP in 1-2 weeks 2. Follow up with General Surgery on 02/05/2019 and with Dr. Redmond Pulling on 02/22/2019 3. Please obtain CMP/CBC, Mag, Phos in one week 4. Please follow up on the following pending results:  Home Health: No Equipment/Devices: None  Discharge Condition: Stable  CODE STATUS: FULL CODE Diet recommendation: Soft Heart Healthy Diet  Brief/Interim Summary: [name] a 68 y.o. AAfemalewith medical history significant for but not limited togynecological abdominal surgeries x2, essential hypertension, chronic depression/anxiety/bipolar disorder as well as others who presented to [facility] ED with complaints of severe abdominal pain associated with nausea and vomiting x4 days. CT abdomen and pelvis with contrast revealed partial small bowel obstruction.  She underwent a diagnostic laparoscopic and converted to exploratory laparotomy as well as adhesional lysis for least 1 hour.  She is postoperative day 3 and now has a postoperative ileus.  Currently she is mobilizing but still not passing any flatus or having a bowel movement.  General Surgery tried a clamping trial and removed the NG tube .  She was started on a clear liquid diet and continues to mobilize and is having bowel movements and passing flatus.  Abdomen still remains distended but not as much and General Surgery started the patient on full liquid diets and has now advanced to soft.  Patient tolerated her soft diet without issues and was still having bowel movements and 2 bowel movements overnight and passing a lot of flatus.  She is deemed stable for discharge will need to follow-up with PCP and general surgeon outpatient setting.  She is recommended a soft  diet at discharge she is understandable agreeable with the plan of care.  Discharge Diagnoses:  Principal Problem:   SBO (small bowel obstruction) (HCC) Active Problems:   HTN (hypertension)   HLD (hyperlipidemia)   Prediabetes   Normocytic anemia   Dehydration   Hypernatremia   Hyperchloremia   Renal insufficiency   Obesity (BMI 30-39.9)  SBO (small bowel obstruction) status post exploratory laparotomy and extensive lysis of adhesions POD 7 done by Dr. Redmond Pulling -s/p open ex laparotomy and extensive lysis of adhesions -Per general surgery, NG tube is been removed and they have placed the patient on a Clear Liquid Diet and she is tolerating sips of clears however ileus is slowly improving; clear liquid diet with advance to full liquid diet and now further advanced to a soft diet by general surgery; she tolerated soft diet without issues -Continue to ambulate and IS -Discontinued IVF with D5W + 40 mEQ KCl at 100 mL/hr given that her potassium level was elevating and that she is now becoming slightly metabolic acidotic; acidosis was improving but slightly became acidotic again restarted fluids at 75 mL's per hour with normal saline will be stopped prior to discharge given she lost IV access -Replace potassium to a level 4 and replace magnesium to a level of 2 ideally -Per general surgery, IV Acetaminophen has now stopped -C/w IV methocarbamol thousand grams every 8 hours scheduled along with morphine IV 2 mg every 4 PRN for severe pain -Appreciate further surgical recommendations and they feel that her ileus is slowly improving.  If no significant improvement (less distention, more bowel movements and flatus) in the next 24 to 36 hours they recommend TPN  and this is been discussed with the patient -Prealbumin was 18.9 -She is tolerating soft diet without issues and is improved.  Abdomen still slightly distended but she is having bowel movements and had 2 bowel movements overnight and has passing  a lot of flatus. -General surgery team patient stable for discharge will need to follow-up with PCP as well as general surgeon outpatient setting  Dehydration, Hypernatremia, Hyperchloremia, improved Elevation in Cr/Renal Insuffiencey; improved Mild Metabolic Acidosis, slightly worsened in the setting of D5W and now restarted normal saline rate 75 mL's but stopped  -Urine output improved after increasing IVF -Na+ is trending down and is improved 135 -Chloride is trending down and is now 109 -CO2 is now 18 and anion gap is 8 -Patient's BUN/creatinine is now 8/0.93 -Her IVF was switched to D5W on 7/27 due to rising sodium and chloride and had been stopped but will resume sodium chloride at 75 mL's per hour given her hypercalcemia and mild metabolic acidosis -Discontinued D5W for now and cont to follow electrolytes and I and O -Continue to monitor and trend renal function and repeat CMP as an outpatient   Normocytic Anemia -Hemoglobin/hematocrit is now stable at 10.8/34.0 -?  Drop due to acute illness and IVF hydration; IV fluids had stopped but resumed yesterday  -Checked Anemia Panel and showed iron level of 13, U IBC of 284, TIBC of 297, saturation ratios of 4%, ferritin of 56, folate level 5.2, vitamin B12 level of 188 -Continue to monitor for signs and symptoms of bleeding; currently no overt bleeding noted -Repeat CBC as an outpatient   Burning Eyes -Possibly dry eyes -Naphazoline-Glycerin 1-2 Drops Both Eyes have helped -Continue to Monitor   Hyperglycemia the setting of Pre-Diabetes -Likely in the setting of D5W but blood sugars have ranged from 98-137 on daily BMPs/CMP's ; this AM was again 98 -Recent Hemoglobin A1c was 6.3 on 01/19/2019 -Continue monitor blood sugars carefully and if necessary will need to place on sensitive along sliding scale AC  HTN (Hypertension) -She takes Amlodipine at at home which is on hold until blood pressure improves; Resume at D/C as BP is  improved -Blood pressures been low but today was 119/75 -C/w Labetalol 10 mg q6hprn for SBP>170  HLD (Hyperlipidemia) -Resume atorvastatin 10 mg p.o. daily now  Hypokalemia -Patient's K+ this AM was 4.7 -IV fluid with D5W is now been stopped and restarted IV fluids with normal saline rate of 75 mL's per hour yesterday but lost IV Access and will not replace as she is being D/C'd -Mag level was 2.3 this AM  -Continue to Monitor and Replete as Necessary -Repeat CMP as an outpatient   Obesity -Estimated body mass index is 33.35 kg/m as calculated from the following:   Height as of this encounter: 5\' 2"  (1.575 m).   Weight as of this encounter: 82.7 kg. -Weight Loss and Dietary Counseling given   Anxiety, Depression, Bipolar Disorder -Resumed Duloxetine 60 mg p.o. daily  -C/w Lithium carbonate 600 mg p.o. daily -Follow up with PCP and Psychiatry in the outpatient setting   Hypercalcemia -Likely reactive and patient's calcium level was 10.4 -Continued IV fluid hydration prior to discharge -Encourage p.o. intake she will repeat calcium level in theoutpatient setting  Discharge Instructions  Discharge Instructions    Call MD for:  difficulty breathing, headache or visual disturbances   Complete by: As directed    Call MD for:  extreme fatigue   Complete by: As directed    Call MD  for:  hives   Complete by: As directed    Call MD for:  persistant dizziness or light-headedness   Complete by: As directed    Call MD for:  persistant nausea and vomiting   Complete by: As directed    Call MD for:  redness, tenderness, or signs of infection (pain, swelling, redness, odor or green/yellow discharge around incision site)   Complete by: As directed    Call MD for:  severe uncontrolled pain   Complete by: As directed    Call MD for:  temperature >100.4   Complete by: As directed    Diet - low sodium heart healthy   Complete by: As directed    Discharge instructions   Complete by:  As directed    You were cared for by a hospitalist during your hospital stay. If you have any questions about your discharge medications or the care you received while you were in the hospital after you are discharged, you can call the unit and ask to speak with the hospitalist on call if the hospitalist that took care of you is not available. Once you are discharged, your primary care physician will handle any further medical issues. Please note that NO REFILLS for any discharge medications will be authorized once you are discharged, as it is imperative that you return to your primary care physician (or establish a relationship with a primary care physician if you do not have one) for your aftercare needs so that they can reassess your need for medications and monitor your lab values.  Follow up with PCP and General Surgery. Take all medications as prescribed. If symptoms change or worsen please return to the ED for evaluation   Increase activity slowly   Complete by: As directed      Allergies as of 01/29/2019      Reactions   Nickel Rash   Penicillins Rash   Did it involve swelling of the face/tongue/throat, SOB, or low BP? No Did it involve sudden or severe rash/hives, skin peeling, or any reaction on the inside of your mouth or nose? No Did you need to seek medical attention at a hospital or doctor's office? No When did it last happen? If all above answers are "NO", may proceed with cephalosporin use.   Wool Alcohol [lanolin] Rash      Medication List    TAKE these medications   acetaminophen 500 MG tablet Commonly known as: TYLENOL You can take 1000 mg of Tylenol every 8 hours as needed for pain.  He can buy this over-the-counter at any drugstore without a prescription.  Do not take more than 4000 mg of Tylenol per day it can harm your liver.   amLODipine 10 MG tablet Commonly known as: NORVASC Take 10 mg by mouth daily.   atorvastatin 10 MG tablet Commonly known as:  LIPITOR Take 10 mg by mouth daily.   azelastine 0.1 % nasal spray Commonly known as: ASTELIN Place 1 spray into both nostrils as needed for allergies.   DULoxetine 60 MG capsule Commonly known as: CYMBALTA Take 60 mg by mouth daily.   lithium carbonate 300 MG capsule Take 600 mg by mouth daily.   methocarbamol 500 MG tablet Commonly known as: ROBAXIN Take 1 tablet (500 mg total) by mouth every 8 (eight) hours as needed for muscle spasms.   pantoprazole 40 MG tablet Commonly known as: PROTONIX Take 1 tablet (40 mg total) by mouth daily. Start taking on: January 30, 2019  traMADol 50 MG tablet Commonly known as: ULTRAM Take 1 tablet (50 mg total) by mouth every 6 (six) hours as needed for moderate pain.   VITAMIN D-3 PO Take 2,000 Units by mouth.      Follow-up Information    Greer Pickerel, MD Follow up on 02/22/2019.   Specialty: General Surgery Why: your appointment is at  8:45AM.  Be at the office 30 minutes early for check in, bring photo ID and insurance information.  Contact information: [address] [place] [address] [phone]        Gregor Hams, FNP Follow up.   Specialty: Family Medicine Why: call and follow up for medical issues, let them know you had surgery and follow you after discharge. Contact information: [place] [address] [phone]        Sur[facility], [place] Follow up on 02/05/2019.   Specialty: General Surgery Why: you will see the nurse for staple removal at 2:00.  Be at the office 30 minutes early for check-in, bring insurance information and photo ID. Contact information: [address] [place] [address] [phone]          Allergies  Allergen Reactions  . Nickel Rash  . Penicillins Rash    Did it involve swelling of the face/tongue/throat, SOB, or low BP? No Did it involve sudden or severe rash/hives, skin peeling, or any reaction on the inside of your  mouth or nose? No Did you need to seek medical attention at a hospital or doctor's office? No When did it last happen? If all above answers are "NO", may proceed with cephalosporin use.  Nilsa Nutting Alcohol [Lanolin] Rash   Consultations: General Surgery  Procedures/Studies: Ct Abdomen Pelvis W Contrast  Result Date: 01/19/2019 CLINICAL DATA:  Abdominal pain with swelling for 3 days. EXAM: CT ABDOMEN AND PELVIS WITH CONTRAST TECHNIQUE: Multidetector CT imaging of the abdomen and pelvis was performed using the standard protocol following bolus administration of intravenous contrast. CONTRAST:  117mL ISOVUE-300 IOPAMIDOL (ISOVUE-300) INJECTION 61% COMPARISON:  CT, 06/20/2006 FINDINGS: Lower chest: No acute abnormality. Hepatobiliary: 2 cm partial fat density lesion at the dome the left liver with associated calcification. Additional low-density lesions seen at the dome of the right lobe, next largest 1.4 cm. No other liver masses or lesions. Normal gallbladder. No bile duct dilation. Pancreas: Unremarkable. No pancreatic ductal dilatation or surrounding inflammatory changes. Spleen: Normal in size without focal abnormality. Adrenals/Urinary Tract: No adrenal masses. Kidneys normal in size, orientation and position with symmetric enhancement and excretion. Small low-density renal lesions are noted too small to fully characterize, but likely cysts. 1 mm nonobstructing stone in the lower pole of the left kidney. No hydronephrosis. Ureters normal in course and in caliber. Bladder is unremarkable. Stomach/Bowel: Stomach is unremarkable. There is dilated small bowel, maximum dilation 4.2 cm, with 2 possible transition points in the pelvis, followed by distal decompressed small bowel. The colon is mostly decompressed. There is no bowel wall thickening. No inflammation Vascular/Lymphatic: Aortic atherosclerosis. No aneurysm. No enlarged lymph nodes. Reproductive: Status post hysterectomy. No adnexal masses.  Other: Trace ascites noted adjacent to the liver. Musculoskeletal: No fracture or acute finding. No osteoblastic or osteolytic lesions. IMPRESSION: 1. Partial small bowel obstruction, apparent transition point in the pelvis, likely from adhesions. 2. No other acute abnormality within the abdomen or pelvis. 3. Low-density lesions at the dome of the liver which are new since the prior CT, but appear  benign. 4. Small low-density renal lesions, too small to fully characterize but likely cysts. Tiny nonobstructing left intrarenal stone. 5. Aortic atherosclerosis. Electronically Signed   By: Lajean Manes M.D.   On: 01/19/2019 13:54   Dg Abd Portable 1v  Result Date: 01/22/2019 CLINICAL DATA:  Abdominal pain. EXAM: PORTABLE ABDOMEN - 1 VIEW COMPARISON:  01/20/2019; 01/19/2019; CT abdomen pelvis-01/19/2019 FINDINGS: Enteric tube tip and side port projected the expected location of the gastric antrum. There is persistent gaseous distention of several loops of small bowel with index loop of small bowel in the left mid hemiabdomen measuring approximately 4.4 cm in diameter, grossly unchanged compared to the 01/20/2019 examination, previously, index loop measures 4.1 cm. A small amount of enteric contrast is seen within the cecum and ascending colon however there is an otherwise conspicuous paucity of distal colonic gas. No supine evidence of pneumoperitoneum. No pneumatosis or portal venous gas. Punctate phleboliths overlie the lower pelvis bilaterally. Limited visualization of lower thorax demonstrates minimal left basilar/retrocardiac heterogeneous opacities favored to represent atelectasis. No acute osseous abnormalities. Mild scoliotic curvature of the thoracolumbar spine with associated mild to moderate multilevel DDD, incompletely evaluated. IMPRESSION: Similar findings worrisome for small bowel obstruction. Electronically Signed   By: Sandi Mariscal M.D.   On: 01/22/2019 07:34   Dg Abd Portable 1v-small Bowel  Obstruction Protocol-24 Hr Delay  Result Date: 01/20/2019 CLINICAL DATA:  68 year old female with follow-up small-bowel obstruction. EXAM: PORTABLE ABDOMEN - 1 VIEW COMPARISON:  Multiple prior radiographs dating back to CT of 01/19/2019. FINDINGS: Enteric tube with tip in the distal stomach. Small amount of oral contrast noted in the stomach. Interval improvement of the previously seen dilated small bowel loops. There is however a persistent dilatation of a loop of small bowel in the mid abdomen measuring up to 4 cm in diameter. A small focus of calcification is noted in the right upper quadrant. The osseous structures and soft tissues are unremarkable. IMPRESSION: Persistent dilatation of a loop of small bowel in the mid abdomen measuring up to 4 cm. Overall improved appearance of the bowel compared to the radiograph of 01/19/2019. Electronically Signed   By: Anner Crete M.D.   On: 01/20/2019 19:46   Dg Abd Portable 1v-small Bowel Obstruction Protocol-initial, 8 Hr Delay  Result Date: 01/20/2019 CLINICAL DATA:  68 year old female with small bowel obstruction. EXAM: PORTABLE ABDOMEN - 1 VIEW COMPARISON:  Earlier radiograph dated 01/19/2019 FINDINGS: Partially visualized enteric tube with tip in the distal stomach. Interval improvement of the dilatation of the small bowel compared to the prior radiograph. A mildly dilated loop of bowel is noted in the left upper abdomen measuring up to 3.6 cm. The osseous structures and soft tissues are grossly unremarkable. IMPRESSION: Interval improvement of the small-bowel obstruction. Electronically Signed   By: Anner Crete M.D.   On: 01/20/2019 02:15   Dg Abd Portable 1v-small Bowel Protocol-position Verification  Result Date: 01/19/2019 CLINICAL DATA:  Check gastric catheter placement EXAM: PORTABLE ABDOMEN - 1 VIEW COMPARISON:  Film from earlier in the same day. FINDINGS: Gastric catheter is been further advanced into the stomach. Multiple dilated loops of  small bowel are noted although slightly improved when compared with the prior exam. IMPRESSION: Nasogastric catheter within the stomach. Slight improvement in the degree of small bowel dilatation when compare with the prior study. Electronically Signed   By: Inez Catalina M.D.   On: 01/19/2019 17:22   Dg Abd Portable 1 View  Result Date: 01/19/2019 CLINICAL DATA:  NG  tube placement. EXAM: PORTABLE ABDOMEN - 1 VIEW COMPARISON:  None. FINDINGS: NG tube is identified with tip overlying the proximal stomach. The side hole size overlies the EG junction. IMPRESSION: NG tube with tip overlying the proximal stomach and side hole overlying the EG junction. Recommend advancement. Electronically Signed   By: Margarette Canada M.D.   On: 01/19/2019 15:17     Subjective: Seen and examined at bedside and had tolerated diet without issues.  States that she had 2 bowel movements overnight and is passing a lot of flatus.  Abdomen is nondistended and states that she can sneeze without causing pain in her abdomen.  No nausea or vomiting.  No other concerns or complaints this time is deemed to for discharge.  Has been ambulating without issues  Discharge Exam: Vitals:   01/28/19 2108 01/29/19 0326  BP: 111/79 119/75  Pulse: 100 98  Resp: 17 18  Temp: 99 F (37.2 C) 98.8 F (37.1 C)  SpO2: 97% 100%   Vitals:   01/28/19 0548 01/28/19 1413 01/28/19 2108 01/29/19 0326  BP: 106/78 123/85 111/79 119/75  Pulse: 94 93 100 98  Resp: 14  17 18   Temp: 97.6 F (36.4 C) 98.6 F (37 C) 99 F (37.2 C) 98.8 F (37.1 C)  TempSrc: Oral Oral Oral Oral  SpO2: 100% 98% 97% 100%  Weight:      Height:       General: Pt is alert, awake, not in acute distress Cardiovascular: RRR, S1/S2 +, no rubs, no gallops Respiratory: Diminished bilaterally, no wheezing, no rhonchi Abdominal: Soft, mildly tender, Distended due to body habitus, bowel sounds +; Abd Incisions appear C/D/I Extremities: Trace edema, no cyanosis  The results of  significant diagnostics from this hospitalization (including imaging, microbiology, ancillary and laboratory) are listed below for reference.    Microbiology: Recent Results (from the past 240 hour(s))  SARS Coronavirus 2 (CEPHEID - Performed in [place] hospital lab), Hosp Order     Status: None   Collection Time: 01/19/19  2:47 PM   Specimen: Nasopharyngeal Swab  Result Value Ref Range Status   SARS Coronavirus 2 NEGATIVE NEGATIVE Final    Comment: (NOTE) If result is NEGATIVE SARS-CoV-2 target nucleic acids are NOT DETECTED. The SARS-CoV-2 RNA is generally detectable in upper and lower  respiratory specimens during the acute phase of infection. The lowest  concentration of SARS-CoV-2 viral copies this assay can detect is 250  copies / mL. A negative result does not preclude SARS-CoV-2 infection  and should not be used as the sole basis for treatment or other  patient management decisions.  A negative result may occur with  improper specimen collection / handling, submission of specimen other  than nasopharyngeal swab, presence of viral mutation(s) within the  areas targeted by this assay, and inadequate number of viral copies  (<250 copies / mL). A negative result must be combined with clinical  observations, patient history, and epidemiological information. If result is POSITIVE SARS-CoV-2 target nucleic acids are DETECTED. The SARS-CoV-2 RNA is generally detectable in upper and lower  respiratory specimens dur ing the acute phase of infection.  Positive  results are indicative of active infection with SARS-CoV-2.  Clinical  correlation with patient history and other diagnostic information is  necessary to determine patient infection status.  Positive results do  not rule out bacterial infection or co-infection with other viruses. If result is PRESUMPTIVE POSTIVE SARS-CoV-2 nucleic acids MAY BE PRESENT.   A presumptive positive result was  obtained on the submitted specimen   and confirmed on repeat testing.  While 2019 novel coronavirus  (SARS-CoV-2) nucleic acids may be present in the submitted sample  additional confirmatory testing may be necessary for epidemiological  and / or clinical management purposes  to differentiate between  SARS-CoV-2 and other Sarbecovirus currently known to infect humans.  If clinically indicated additional testing with an alternate test  methodology [phone]) is advised. The SARS-CoV-2 RNA is generally  detectable in upper and lower respiratory sp ecimens during the acute  phase of infection. The expected result is Negative. Fact Sheet for Patients:  StrictlyIdeas.no Fact Sheet for Healthcare Providers: BankingDealers.co.za This test is not yet approved or cleared by the [place] FDA and has been authorized for detection and/or diagnosis of SARS-CoV-2 by FDA under an Emergency Use Authorization (EUA).  This EUA will remain in effect (meaning this test can be used) for the duration of the COVID-19 declaration under Section 564(b)(1) of the Act, 21 U.S.C. section 360bbb-3(b)(1), unless the authorization is terminated or revoked sooner. Performed at [facility], [place] [address], [place], [place] [address]   Surgical pcr screen     Status: None   Collection Time: 01/22/19  9:05 AM   Specimen: Nasal Mucosa; Nasal Swab  Result Value Ref Range Status   MRSA, PCR NEGATIVE NEGATIVE Final   Staphylococcus aureus NEGATIVE NEGATIVE Final    Comment: (NOTE) The Xpert SA Assay (FDA approved for NASAL specimens in patients 33 years of age and older), is one component of a comprehensive surveillance program. It is not intended to diagnose infection nor to guide or monitor treatment. Performed at [facility], [place] [address], [place],  [address]     Labs: BNP (last 3 results) No results for input(s): BNP in the last 8760 hours. Basic Metabolic Panel: Recent  Labs  Lab 01/25/19 0210 01/26/19 0248 01/27/19 0211 01/28/19 0356 01/29/19 0315  NA 141 136 136 135 135  K 3.5 4.7 4.5 4.7 4.7  CL 112* 110 109 109 109  CO2 20* 18* 20* 18* 18*  GLUCOSE 122* 110* 105* 105* 98  BUN 8 7* 6* 8 9  CREATININE 1.01* 0.88 0.95 0.93 0.93  CALCIUM 9.5 9.7 9.9 10.4* 10.4*  MG 2.1 2.0 2.3 2.5* 2.3  PHOS 3.3 3.4 3.3 4.0 3.8   Liver Function Tests: Recent Labs  Lab 01/25/19 0210 01/26/19 0248 01/27/19 0211 01/28/19 0356 01/29/19 0315  AST 13* 13* 12* 17 25  ALT 13 12 14 18 25   ALKPHOS 65 61 62 64 73  BILITOT 0.9 0.6 1.0 0.9 0.9  PROT 6.2* 6.2* 6.3* 6.6 6.7  ALBUMIN 3.4* 3.4* 3.5 3.6 3.6   No results for input(s): LIPASE, AMYLASE in the last 168 hours. No results for input(s): AMMONIA in the last 168 hours. CBC: Recent Labs  Lab 01/25/19 0210 01/26/19 0248 01/27/19 0211 01/28/19 0356 01/29/19 0315  WBC 8.3 8.2 6.7 7.0 7.2  NEUTROABS 6.1 6.8 5.0 5.3 5.4  HGB 10.7* 10.4* 10.7* 10.6* 10.8*  HCT 34.1* 33.5* 33.5* 32.8* 34.0*  MCV 91.9 92.5 89.6 90.1 90.4  PLT 380 380 PLATELET CLUMPS NOTED ON SMEAR, UNABLE TO ESTIMATE 367 380   Cardiac Enzymes: No results for input(s): CKTOTAL, CKMB, CKMBINDEX, TROPONINI in the last 168 hours. BNP: Invalid input(s): POCBNP CBG: No results for input(s): GLUCAP in the last 168 hours. D-Dimer No results for input(s): DDIMER in the last 72 hours. Hgb A1c No results for input(s): HGBA1C  in the last 72 hours. Lipid Profile No results for input(s): CHOL, HDL, LDLCALC, TRIG, CHOLHDL, LDLDIRECT in the last 72 hours. Thyroid function studies No results for input(s): TSH, T4TOTAL, T3FREE, THYROIDAB in the last 72 hours.  Invalid input(s): FREET3 Anemia work up No results for input(s): VITAMINB12, FOLATE, FERRITIN, TIBC, IRON, RETICCTPCT in the last 72 hours. Urinalysis    Component Value Date/Time   COLORURINE YELLOW 01/19/2019 1046   APPEARANCEUR CLEAR 01/19/2019 1046   LABSPEC 1.012 01/19/2019 1046    PHURINE 6.0 01/19/2019 1046   GLUCOSEU NEGATIVE 01/19/2019 1046   HGBUR NEGATIVE 01/19/2019 1046   [place] 01/19/2019 1046   [place] 01/19/2019 1046   PROTEINUR 100 (A) 01/19/2019 1046   UROBILINOGEN 0.2 03/18/2011 2118   NITRITE NEGATIVE 01/19/2019 1046   LEUKOCYTESUR NEGATIVE 01/19/2019 1046   Sepsis Labs Invalid input(s): PROCALCITONIN,  WBC,  LACTICIDVEN Microbiology Recent Results (from the past 240 hour(s))  SARS Coronavirus 2 (CEPHEID - Performed in [place] hospital lab), Hosp Order     Status: None   Collection Time: 01/19/19  2:47 PM   Specimen: Nasopharyngeal Swab  Result Value Ref Range Status   SARS Coronavirus 2 NEGATIVE NEGATIVE Final    Comment: (NOTE) If result is NEGATIVE SARS-CoV-2 target nucleic acids are NOT DETECTED. The SARS-CoV-2 RNA is generally detectable in upper and lower  respiratory specimens during the acute phase of infection. The lowest  concentration of SARS-CoV-2 viral copies this assay can detect is 250  copies / mL. A negative result does not preclude SARS-CoV-2 infection  and should not be used as the sole basis for treatment or other  patient management decisions.  A negative result may occur with  improper specimen collection / handling, submission of specimen other  than nasopharyngeal swab, presence of viral mutation(s) within the  areas targeted by this assay, and inadequate number of viral copies  (<250 copies / mL). A negative result must be combined with clinical  observations, patient history, and epidemiological information. If result is POSITIVE SARS-CoV-2 target nucleic acids are DETECTED. The SARS-CoV-2 RNA is generally detectable in upper and lower  respiratory specimens dur ing the acute phase of infection.  Positive  results are indicative of active infection with SARS-CoV-2.  Clinical  correlation with patient history and other diagnostic information is  necessary to determine patient infection  status.  Positive results do  not rule out bacterial infection or co-infection with other viruses. If result is PRESUMPTIVE POSTIVE SARS-CoV-2 nucleic acids MAY BE PRESENT.   A presumptive positive result was obtained on the submitted specimen  and confirmed on repeat testing.  While 2019 novel coronavirus  (SARS-CoV-2) nucleic acids may be present in the submitted sample  additional confirmatory testing may be necessary for epidemiological  and / or clinical management purposes  to differentiate between  SARS-CoV-2 and other Sarbecovirus currently known to infect humans.  If clinically indicated additional testing with an alternate test  methodology [id number]) is advised. The SARS-CoV-2 RNA is generally  detectable in upper and lower respiratory sp ecimens during the acute  phase of infection. The expected result is Negative. Fact Sheet for Patients:  StrictlyIdeas.no Fact Sheet for Healthcare Providers: BankingDealers.co.za This test is not yet approved or cleared by the [place] FDA and has been authorized for detection and/or diagnosis of SARS-CoV-2 by FDA under an Emergency Use Authorization (EUA).  This EUA will remain in effect (meaning this test can be used) for the duration of the COVID-19 declaration  under Section 564(b)(1) of the Act, 21 U.S.C. section 360bbb-3(b)(1), unless the authorization is terminated or revoked sooner. Performed at [facility], [place] [address] [place], [place] [address]   Surgical pcr screen     Status: None   Collection Time: 01/22/19  9:05 AM   Specimen: Nasal Mucosa; Nasal Swab  Result Value Ref Range Status   MRSA, PCR NEGATIVE NEGATIVE Final   Staphylococcus aureus NEGATIVE NEGATIVE Final    Comment: (NOTE) The Xpert SA Assay (FDA approved for NASAL specimens in patients 38 years of age and older), is one component of a comprehensive surveillance program. It is not intended to  diagnose infection nor to guide or monitor treatment. Performed at [facility], [place] [address], [place], [place] [address]    Time coordinating discharge: 35 minutes  SIGNED:  Kerney Elbe, DO [facility] 01/29/2019, 11:25 AM Pager is on [place]  If 7PM-7AM, please contact night-coverage [email or web address] Password [email or web address]

## 2019-02-16 ENCOUNTER — Emergency Department (HOSPITAL_COMMUNITY)
Admission: EM | Admit: 2019-02-16 | Discharge: 2019-02-17 | Disposition: A | Discharge status: Home / Self Care | Payer: Medicare Other | Attending: Emergency Medicine | Admitting: Emergency Medicine

## 2019-02-16 ENCOUNTER — Encounter (HOSPITAL_COMMUNITY): Payer: Self-pay | Admitting: Emergency Medicine

## 2019-02-16 ENCOUNTER — Other Ambulatory Visit: Payer: Self-pay

## 2019-02-16 DIAGNOSIS — I10 Essential (primary) hypertension: Secondary | ICD-10-CM | POA: Diagnosis not present

## 2019-02-16 DIAGNOSIS — R1084 Generalized abdominal pain: Secondary | ICD-10-CM | POA: Diagnosis not present

## 2019-02-16 DIAGNOSIS — Z79899 Other long term (current) drug therapy: Secondary | ICD-10-CM | POA: Diagnosis not present

## 2019-02-16 DIAGNOSIS — R112 Nausea with vomiting, unspecified: Secondary | ICD-10-CM | POA: Diagnosis present

## 2019-02-16 LAB — URINALYSIS, ROUTINE W REFLEX MICROSCOPIC
Bilirubin Urine: NEGATIVE
Glucose, UA: NEGATIVE mg/dL
Hgb urine dipstick: NEGATIVE
Ketones, ur: NEGATIVE mg/dL
Nitrite: NEGATIVE
Protein, ur: 100 mg/dL — AB
Specific Gravity, Urine: 1.015 (ref 1.005–1.030)
Squamous Epithelial / LPF: >50 — ABNORMAL HIGH (ref 0–5)
pH: 5 (ref 5.0–8.0)

## 2019-02-16 LAB — COMPREHENSIVE METABOLIC PANEL
ALT: 18 U/L (ref 0–44)
AST: 20 U/L (ref 15–41)
Albumin: 4.1 g/dL (ref 3.5–5.0)
Alkaline Phosphatase: 89 U/L (ref 38–126)
Anion gap: 10 (ref 5–15)
BUN: 11 mg/dL (ref 8–23)
CO2: 22 mmol/L (ref 22–32)
Calcium: 11.2 mg/dL — ABNORMAL HIGH (ref 8.9–10.3)
Chloride: 106 mmol/L (ref 98–111)
Creatinine, Ser: 1.36 mg/dL — ABNORMAL HIGH (ref 0.44–1.00)
GFR calc Af Amer: 46 mL/min — ABNORMAL LOW (ref >60)
GFR calc non Af Amer: 40 mL/min — ABNORMAL LOW (ref >60)
Glucose, Bld: 139 mg/dL — ABNORMAL HIGH (ref 70–99)
Potassium: 3.8 mmol/L (ref 3.5–5.1)
Sodium: 138 mmol/L (ref 135–145)
Total Bilirubin: 1.5 mg/dL — ABNORMAL HIGH (ref 0.3–1.2)
Total Protein: 7.3 g/dL (ref 6.5–8.1)

## 2019-02-16 LAB — CBC
HCT: 38.2 % (ref 36.0–46.0)
Hemoglobin: 12.2 g/dL (ref 12.0–15.0)
MCH: 28.6 pg (ref 26.0–34.0)
MCHC: 31.9 g/dL (ref 30.0–36.0)
MCV: 89.5 fL (ref 80.0–100.0)
Platelets: 321 10*3/uL (ref 150–400)
RBC: 4.27 MIL/uL (ref 3.87–5.11)
RDW: 16 % — ABNORMAL HIGH (ref 11.5–15.5)
WBC: 8.3 10*3/uL (ref 4.0–10.5)
nRBC: 0 % (ref 0.0–0.2)

## 2019-02-16 LAB — LIPASE, BLOOD: Lipase: 28 U/L (ref 11–51)

## 2019-02-16 MED ORDER — SODIUM CHLORIDE 0.9% FLUSH: 3.0000 mL | Freq: Once | INTRAVENOUS | Status: DC

## 2019-02-16 NOTE — ED Triage Notes (Signed)
Pt here post op post SBO repair for eval of emesis X 5 days. States anytime she drinks anything it comes right back up. Having BM without difficulty. States surgical site looks good with no signs of infection, pain to site is about a 3/10.

## 2019-02-17 ENCOUNTER — Emergency Department (HOSPITAL_COMMUNITY): Payer: Medicare Other

## 2019-02-17 MED ORDER — SODIUM CHLORIDE 0.9 % IV BOLUS
1000.0000 mL | Freq: Once | INTRAVENOUS | Status: AC
  Administered 2019-02-17: 01:00:00 1000 mL via INTRAVENOUS

## 2019-02-17 MED ORDER — ONDANSETRON HCL 4 MG/2ML IJ SOLN
4.0000 mg | Freq: Once | INTRAMUSCULAR | Status: AC
  Administered 2019-02-17: 01:00:00 4 mg via INTRAVENOUS
  Filled 2019-02-17: qty 2

## 2019-02-17 MED ORDER — IOHEXOL 300 MG/ML  SOLN
100.0000 mL | Freq: Once | INTRAMUSCULAR | Status: AC | PRN
  Administered 2019-02-17: 100 mL via INTRAVENOUS

## 2019-02-17 NOTE — H&P (Signed)
Reason for Consult: vomiting Referring Physician: [name] is an 68 y.o. female.  HPI: 68 yo female with recent small bowel obstruction resulting in lysis of adhesions by Dr. Redmond [name] on 7/27. She was discharged 01/29/19. She was having small stools and flatus but not tolerating much food. Over the last 5 days she has had several episodes of vomiting without nausea. Once here she has had 2 large bowel movements and able to tolerate some liquids without vomiting.   Past Medical History:  Diagnosis Date   Anxiety    Depression    Hx of adenomatous colonic polyps 05/14/2015   Hypertension    Papilloma of breast    right   Prolapsed internal hemorrhoids, grade 3 05/09/2015   Vitamin D deficiency     Past Surgical History:  Procedure Laterality Date   ABDOMINAL HYSTERECTOMY     ANKLE FRACTURE SURGERY     BREAST LUMPECTOMY WITH RADIOACTIVE SEED LOCALIZATION Right 05/06/2015   Procedure: RIGHT BREAST LUMPECTOMY WITH RADIOACTIVE SEED LOCALIZATION;  Surgeon: Brandi Keens, MD;  Location: [facility];  Service: General;  Laterality: Right;   COLONOSCOPY     ESOPHAGOGASTRODUODENOSCOPY     FINGER FRACTURE SURGERY     HEMORRHOID BANDING     LAPAROSCOPY N/A 01/22/2019   Procedure: LAPAROSCOPY DIAGNOSTIC;  Surgeon: Brandi Pickerel, MD;  Location: [name];  Service: General;  Laterality: N/A;   LAPAROTOMY N/A 01/22/2019   Procedure: EXPLORATORY LAPAROTOMY;  Surgeon: Brandi Pickerel, MD;  Location: [facility];  Service: General;  Laterality: N/A;   LYSIS OF ADHESION  01/22/2019   Procedure: Lysis Of  Adhesions x 1 hour;  Surgeon: Brandi Pickerel, MD;  Location: [name];  Service: General;;   OVARIAN CYST REMOVAL     TONSILLECTOMY AND ADENOIDECTOMY      Family History  Problem Relation Age of Onset   Colon cancer Neg Hx     Social History:  reports that she has never smoked. She has never used smokeless tobacco. She reports that she does not drink  alcohol or use drugs.  Allergies:  Allergies  Allergen Reactions   Nickel Rash   Penicillins Rash    Did it involve swelling of the face/tongue/throat, SOB, or low BP? No Did it involve sudden or severe rash/hives, skin peeling, or any reaction on the inside of your mouth or nose? No Did you need to seek medical attention at a hospital or doctor's office? No When did it last happen? If all above answers are NO, may proceed with cephalosporin use.   Wool Alcohol [Lanolin] Rash    Medications: I have reviewed the patient's current medications.  Results for orders placed or performed during the hospital encounter of 02/16/19 (from the past 48 hour(s))  Lipase, blood     Status: None   Collection Time: 02/16/19  5:37 PM  Result Value Ref Range   Lipase 28 11 - 51 U/L    Comment: Performed at [name], [place] [address], [name], [name] [address]  Comprehensive metabolic panel     Status: Abnormal   Collection Time: 02/16/19  5:37 PM  Result Value Ref Range   Sodium 138 135 - 145 mmol/L   Potassium 3.8 3.5 - 5.1 mmol/L   Chloride 106 98 - 111 mmol/L   CO2 22 22 - 32 mmol/L   Glucose, Bld 139 (H) 70 - 99 mg/dL   BUN 11 8 - 23 mg/dL   Creatinine, Ser  1.36 (H) 0.44 - 1.00 mg/dL   Calcium 11.2 (H) 8.9 - 10.3 mg/dL   Total Protein 7.3 6.5 - 8.1 g/dL   Albumin 4.1 3.5 - 5.0 g/dL   AST 20 15 - 41 U/L   ALT 18 0 - 44 U/L   Alkaline Phosphatase 89 38 - 126 U/L   Total Bilirubin 1.5 (H) 0.3 - 1.2 mg/dL   GFR calc non Af Amer 40 (L) >60 mL/min   GFR calc Af Amer 46 (L) >60 mL/min   Anion gap 10 5 - 15    Comment: Performed at [name] [address] [place], [name] [address]  CBC     Status: Abnormal   Collection Time: 02/16/19  5:37 PM  Result Value Ref Range   WBC 8.3 4.0 - 10.5 K/uL   RBC 4.27 3.87 - 5.11 MIL/uL   Hemoglobin 12.2 12.0 - 15.0 g/dL   HCT 38.2 36.0 - 46.0 %   MCV 89.5 80.0 - 100.0 fL   MCH 28.6 26.0 - 34.0 pg   MCHC 31.9 30.0 -  36.0 g/dL   RDW 16.0 (H) 11.5 - 15.5 %   Platelets 321 150 - 400 K/uL   nRBC 0.0 0.0 - 0.2 %    Comment: Performed at [name], [name] [address], [name], [name] [address]  Urinalysis, Routine w reflex microscopic     Status: Abnormal   Collection Time: 02/16/19  5:37 PM  Result Value Ref Range   Color, Urine YELLOW YELLOW   APPearance CLOUDY (A) CLEAR   Specific Gravity, Urine 1.015 1.005 - 1.030   pH 5.0 5.0 - 8.0   Glucose, UA NEGATIVE NEGATIVE mg/dL   Hgb urine dipstick NEGATIVE NEGATIVE   Bilirubin Urine NEGATIVE NEGATIVE   Ketones, ur NEGATIVE NEGATIVE mg/dL   Protein, ur 100 (A) NEGATIVE mg/dL   Nitrite NEGATIVE NEGATIVE   Leukocytes,Ua LARGE (A) NEGATIVE   RBC / HPF 21-50 0 - 5 RBC/hpf   WBC, UA 11-20 0 - 5 WBC/hpf   Bacteria, UA FEW (A) NONE SEEN   Squamous Epithelial / LPF >50 (H) 0 - 5   Mucus PRESENT    Hyaline Casts, UA PRESENT     Comment: Performed at [name], [name] [address] [name], [name] [address]    Ct Abdomen Pelvis W Contrast  Result Date: 02/17/2019 CLINICAL DATA:  Abdominal pain EXAM: CT ABDOMEN AND PELVIS WITH CONTRAST TECHNIQUE: Multidetector CT imaging of the abdomen and pelvis was performed using the standard protocol following bolus administration of intravenous contrast. CONTRAST:  115mL OMNIPAQUE IOHEXOL 300 MG/ML  SOLN COMPARISON:  January 19, 2019 FINDINGS: Lower chest: The lung bases are clear. The heart size is normal. Hepatobiliary: Simple and complex appearing hepatic cysts are noted. The gallbladder is distended. Some of these appear to demonstrate fat consistent with a ruptured dermoid. There is likely some gallbladder sludge within the dependent portions of the gallbladder lumen.There is no biliary ductal dilation. Pancreas: Normal contours without ductal dilatation. No peripancreatic fluid collection. Spleen: No splenic laceration or hematoma. Adrenals/Urinary Tract: --Adrenal glands: No adrenal hemorrhage. --Right  kidney/ureter: No hydronephrosis or perinephric hematoma. --Left kidney/ureter: There is a punctate nonobstructing stone in the lower pole the left kidney --Urinary bladder: Unremarkable. Stomach/Bowel: --Stomach/Duodenum: No hiatal hernia or other gastric abnormality. Normal duodenal course and caliber. --Small bowel: There is some mildly dilated loops of small bowel scattered throughout the abdomen with developing air-fluid levels. A distinct transition  point is not clearly identified but is likely located in the lower abdomen or right lower quadrant. --Colon: No focal abnormality. --Appendix: The appendix is not reliably identified. Vascular/Lymphatic: Normal course and caliber of the major abdominal vessels. --No retroperitoneal lymphadenopathy. --No mesenteric lymphadenopathy. --No pelvic or inguinal lymphadenopathy. Reproductive: Status post hysterectomy. No adnexal mass. Other: No ascites or free air. The abdominal wall is normal. Musculoskeletal. No acute displaced fractures. IMPRESSION: 1. Overall findings concerning for a developing small bowel obstruction as detailed above. There is no pneumatosis or free air. 2. Distended gallbladder with probable gallbladder sludge. 3. Scattered fat containing lesions about the liver, likely related to rupture of a dermoid cyst as seen on the patient's prior CT from 2007. Electronically Signed   By: Constance Holster M.D.   On: 02/17/2019 02:55    Review of Systems  Constitutional: Negative for chills and fever.  HENT: Negative for hearing loss.   Eyes: Negative for blurred vision and double vision.  Respiratory: Negative for cough and hemoptysis.   Cardiovascular: Negative for chest pain and palpitations.  Gastrointestinal: Positive for constipation and vomiting. Negative for abdominal pain and nausea.  Genitourinary: Negative for dysuria and urgency.  Musculoskeletal: Negative for myalgias and neck pain.  Skin: Negative for itching and rash.    Neurological: Negative for dizziness, tingling and headaches.  Endo/Heme/Allergies: Does not bruise/bleed easily.  Psychiatric/Behavioral: Negative for depression and suicidal ideas.   Blood pressure 125/75, pulse (!) 110, temperature 98.6 F (37 C), temperature source Oral, resp. rate 14, SpO2 95 %. Physical Exam  Vitals reviewed. Constitutional: She is oriented to person, place, and time. She appears well-developed and well-nourished.  HENT:  Head: Normocephalic and atraumatic.  Eyes: Pupils are equal, round, and reactive to light. Conjunctivae and EOM are normal.  Neck: Normal range of motion. Neck supple.  Cardiovascular: Normal rate and regular rhythm.  Respiratory: Effort normal.  GI: Soft. Bowel sounds are normal. She exhibits no distension. There is no abdominal tenderness.  Musculoskeletal: Normal range of motion.  Neurological: She is alert and oriented to person, place, and time.  Skin: Skin is warm and dry.  Psychiatric: She has a normal mood and affect. Her behavior is normal.   Assessment/Plan: 67 yo female 2 month out from lysis of adhesions for bowel obstruction. We discussed symptoms and findings on the CT scan. She was able to tolerate some liquids recently without vomiting. I gave the option to watch here and slowly advance diet. She would prefer to be discharged home. We will trial additional liquids and if she tolerates I think she is safe to discharge home.  [name] 02/17/2019, 5:38 AM

## 2019-02-17 NOTE — Discharge Instructions (Signed)
We recommend that you continue with a clear liquid diet until your nausea and abdominal discomfort resolve.  Continue follow-up with general surgery as scheduled.  We also advise follow-up with your primary care doctor.  Should you experience worsening or persistent nausea, vomiting, abdominal pain, or if you develop fever, blood in your vomit, bloody bowel movements, return to the emergency department for repeat evaluation.

## 2019-02-17 NOTE — ED Notes (Signed)
Doctor at  The bedside  . Pt to be  Given po fluids

## 2019-02-17 NOTE — ED Provider Notes (Signed)
[facility] EMERGENCY DEPARTMENT Provider Note   CSN: [id number] Arrival date & time: 02/16/19  1724     History   Chief Complaint Chief Complaint  Patient presents with  . Post-op Problem    HPI [name] is a 68 y.o. female.     68 y/o female with hx fo HTN, anxiety, depression presents to the emergency department for evaluation of nausea and vomiting.  She has had persistent nausea and vomiting over the past 5 days.  Usually only one episode of emesis daily, but this occurs 20 to 30 minutes after any oral intake.  Her last episode of vomiting was at 10 AM yesterday.  She called her surgeon's office and they advised her to be seen in the ED.  She has not had any recurrent emesis since passage of 2 large bowel movements.  Prior to this she was having 1 bowel movement per day.  Continues to endorse passing of flatus.  Would feel her bowels moving at times, but denies any distinct abdominal pain.  Presently 26 days s/p diagnostic laparoscopic, converted to exploratory laparotomy, as well as adhesional lysis for management of pSBO in July.     Past Medical History:  Diagnosis Date  . Anxiety   . Depression   . Hx of adenomatous colonic polyps 05/14/2015  . Hypertension   . Papilloma of breast    right  . Prolapsed internal hemorrhoids, grade 3 05/09/2015  . Vitamin D deficiency     Patient Active Problem List   Diagnosis Date Noted  . Prediabetes 01/24/2019  . Normocytic anemia 01/24/2019  . Dehydration 01/24/2019  . Hypernatremia 01/24/2019  . Hyperchloremia 01/24/2019  . Renal insufficiency 01/24/2019  . Obesity (BMI 30-39.9) 01/24/2019  . HTN (hypertension) 01/20/2019  . HLD (hyperlipidemia) 01/20/2019  . SBO (small bowel obstruction) ([place]) 01/19/2019  . Hx of adenomatous colonic polyps 05/14/2015  . Prolapsed internal hemorrhoids, grade 3 05/09/2015    Past Surgical History:  Procedure Laterality Date  . ABDOMINAL HYSTERECTOMY    .  ANKLE FRACTURE SURGERY    . BREAST LUMPECTOMY WITH RADIOACTIVE SEED LOCALIZATION Right 05/06/2015   Procedure: RIGHT BREAST LUMPECTOMY WITH RADIOACTIVE SEED LOCALIZATION;  Surgeon: Coralie Keens, MD;  Location: [facility];  Service: General;  Laterality: Right;  . COLONOSCOPY    . ESOPHAGOGASTRODUODENOSCOPY    . FINGER FRACTURE SURGERY    . HEMORRHOID BANDING    . LAPAROSCOPY N/A 01/22/2019   Procedure: LAPAROSCOPY DIAGNOSTIC;  Surgeon: Greer Pickerel, MD;  Location: [facility];  Service: General;  Laterality: N/A;  . LAPAROTOMY N/A 01/22/2019   Procedure: EXPLORATORY LAPAROTOMY;  Surgeon: Greer Pickerel, MD;  Location: [facility];  Service: General;  Laterality: N/A;  . LYSIS OF ADHESION  01/22/2019   Procedure: Lysis Of  Adhesions x 1 hour;  Surgeon: Greer Pickerel, MD;  Location: [facility];  Service: General;;  . OVARIAN CYST REMOVAL    . TONSILLECTOMY AND ADENOIDECTOMY       OB History   No obstetric history on file.      Home Medications    Prior to Admission medications   Medication Sig Start Date End Date Taking? Authorizing Provider  acetaminophen (TYLENOL) 500 MG tablet You can take 1000 mg of Tylenol every 8 hours as needed for pain.  He can buy this over-the-counter at any drugstore without a prescription.  Do not take more than 4000 mg of Tylenol per day it can harm your liver. 01/29/19  Earnstine Regal, PA-C  amLODipine (NORVASC) 10 MG tablet Take 10 mg by mouth daily. 11/13/18   [provider]  atorvastatin (LIPITOR) 10 MG tablet Take 10 mg by mouth daily. 12/22/18   [provider]  azelastine (ASTELIN) 0.1 % nasal spray Place 1 spray into both nostrils as needed for allergies. 08/26/18   [provider]  Cholecalciferol (VITAMIN D-3 PO) Take 2,000 Units by mouth.    [provider]  DULoxetine (CYMBALTA) 60 MG capsule Take 60 mg by mouth daily.    [provider]  lithium carbonate 300 MG capsule Take 600 mg by mouth daily.  11/17/18   [provider]  methocarbamol (ROBAXIN) 500 MG tablet Take 1 tablet (500 mg total) by mouth every 8 (eight) hours as needed for muscle spasms. 01/29/19   Sheikh, Omair Latif, DO  pantoprazole (PROTONIX) 40 MG tablet Take 1 tablet (40 mg total) by mouth daily. 01/30/19   Sheikh, Georgina Quint Latif, DO  traMADol (ULTRAM) 50 MG tablet Take 1 tablet (50 mg total) by mouth every 6 (six) hours as needed for moderate pain. 01/29/19   Earnstine Regal, PA-C    Family History Family History  Problem Relation Age of Onset  . Colon cancer Neg Hx     Social History Social History   Tobacco Use  . Smoking status: Never Smoker  . Smokeless tobacco: Never Used  Substance Use Topics  . Alcohol use: No    Alcohol/week: 0.0 standard drinks  . Drug use: No     Allergies   Nickel, Penicillins, and Wool alcohol [lanolin]   Review of Systems Review of Systems Ten systems reviewed and are negative for acute change, except as noted in the HPI.    Physical Exam Updated Vital Signs BP 118/79   Pulse (!) 109   Temp 98.6 F (37 C) (Oral)   Resp 14   SpO2 95%   Physical Exam Vitals signs and nursing note reviewed.  Constitutional:      General: She is not in acute distress.    Appearance: She is well-developed. She is not diaphoretic.     Comments: Nontoxic appearing and in NAD  HENT:     Head: Normocephalic and atraumatic.  Eyes:     General: No scleral icterus.    Conjunctiva/sclera: Conjunctivae normal.  Neck:     Musculoskeletal: Normal range of motion.  Cardiovascular:     Rate and Rhythm: Normal rate and regular rhythm.     Pulses: Normal pulses.  Pulmonary:     Effort: Pulmonary effort is normal. No respiratory distress.     Comments: Respirations even and unlabored Abdominal:     Palpations: Abdomen is soft.     Comments: Well healed surgical midline incision. No associated erythema, heat to touch. No drainage. Abdomen is soft, mildly distended. Nontender.   Musculoskeletal: Normal range of motion.  Skin:    General: Skin is warm and dry.     Coloration: Skin is not pale.     Findings: No erythema or rash.  Neurological:     Mental Status: She is alert and oriented to person, place, and time.     Coordination: Coordination normal.  Psychiatric:        Behavior: Behavior normal.      ED Treatments / Results  Labs (all labs ordered are listed, but only abnormal results are displayed) Labs Reviewed  COMPREHENSIVE METABOLIC PANEL - Abnormal; Notable for the following components:      Result  Value   Glucose, Bld 139 (*)    Creatinine, Ser 1.36 (*)    Calcium 11.2 (*)    Total Bilirubin 1.5 (*)    GFR calc non Af Amer 40 (*)    GFR calc Af Amer 46 (*)    All other components within normal limits  CBC - Abnormal; Notable for the following components:   RDW 16.0 (*)    All other components within normal limits  URINALYSIS, ROUTINE W REFLEX MICROSCOPIC - Abnormal; Notable for the following components:   APPearance CLOUDY (*)    Protein, ur 100 (*)    Leukocytes,Ua LARGE (*)    Bacteria, UA FEW (*)    Squamous Epithelial / LPF >50 (*)    All other components within normal limits  LIPASE, BLOOD    EKG None  Radiology Ct Abdomen Pelvis W Contrast  Result Date: 02/17/2019 CLINICAL DATA:  Abdominal pain EXAM: CT ABDOMEN AND PELVIS WITH CONTRAST TECHNIQUE: Multidetector CT imaging of the abdomen and pelvis was performed using the standard protocol following bolus administration of intravenous contrast. CONTRAST:  123mL OMNIPAQUE IOHEXOL 300 MG/ML  SOLN COMPARISON:  January 19, 2019 FINDINGS: Lower chest: The lung bases are clear. The heart size is normal. Hepatobiliary: Simple and complex appearing hepatic cysts are noted. The gallbladder is distended. Some of these appear to demonstrate fat consistent with a ruptured dermoid. There is likely some gallbladder sludge within the dependent portions of the gallbladder lumen.There is no biliary  ductal dilation. Pancreas: Normal contours without ductal dilatation. No peripancreatic fluid collection. Spleen: No splenic laceration or hematoma. Adrenals/Urinary Tract: --Adrenal glands: No adrenal hemorrhage. --Right kidney/ureter: No hydronephrosis or perinephric hematoma. --Left kidney/ureter: There is a punctate nonobstructing stone in the lower pole the left kidney --Urinary bladder: Unremarkable. Stomach/Bowel: --Stomach/Duodenum: No hiatal hernia or other gastric abnormality. Normal duodenal course and caliber. --Small bowel: There is some mildly dilated loops of small bowel scattered throughout the abdomen with developing air-fluid levels. A distinct transition point is not clearly identified but is likely located in the lower abdomen or right lower quadrant. --Colon: No focal abnormality. --Appendix: The appendix is not reliably identified. Vascular/Lymphatic: Normal course and caliber of the major abdominal vessels. --No retroperitoneal lymphadenopathy. --No mesenteric lymphadenopathy. --No pelvic or inguinal lymphadenopathy. Reproductive: Status post hysterectomy. No adnexal mass. Other: No ascites or free air. The abdominal wall is normal. Musculoskeletal. No acute displaced fractures. IMPRESSION: 1. Overall findings concerning for a developing small bowel obstruction as detailed above. There is no pneumatosis or free air. 2. Distended gallbladder with probable gallbladder sludge. 3. Scattered fat containing lesions about the liver, likely related to rupture of a dermoid cyst as seen on the patient's prior CT from 2007. Electronically Signed   By: Constance Holster M.D.   On: 02/17/2019 02:55    Procedures Procedures (including critical care time)  Medications Ordered in ED Medications  sodium chloride flush (NS) 0.9 % injection 3 mL (3 mLs Intravenous Not Given 02/17/19 0259)  sodium chloride 0.9 % bolus 1,000 mL (0 mLs Intravenous Stopped 02/17/19 0258)  ondansetron (ZOFRAN) injection 4 mg  (4 mg Intravenous Given 02/17/19 0126)  iohexol (OMNIPAQUE) 300 MG/ML solution 100 mL (100 mLs Intravenous Contrast Given 02/17/19 0205)    3:42 AM CT scan concerning for developing small bowel obstruction.  No free air or other complicating features.  Case discussed with Dr. Kieth Brightly of general surgery.  He will assess the patient in the ED to assist in disposition.  5:45  AM Patient evaluated by Dr. Kieth Brightly of general surgery.  He offered the patient admission, but she declined.  She would rather be discharged and continue with outpatient follow-up.  Will fluid challenge with plan for discharge if able to tolerate PO.  6:54 AM Patient has tolerated a cup of ginger ale and is working on a cup of apple juice. She has no c/o nausea or abdominal pain. No subsequent vomiting.   Initial Impression / Assessment and Plan / ED Course  I have reviewed the triage vital signs and the nursing notes.  Pertinent labs & imaging results that were available during my care of the patient were reviewed by me and considered in my medical decision making (see chart for details).  Clinical Course as of Feb 16 654  Sat Feb 17, 2019  0023 CBC(!) [KH]    Clinical Course User Index [KH] Antonietta Breach, [place]       68 year old female presents to the emergency department for evaluation of 5 days of abdominal pain with nausea and vomiting.  Despite vomiting, continues to pass flatus with 2 BMs today.  She was admitted 1 month ago for bowel obstruction and underwent surgery for lysis of adhesions.  Her surgical incision site is well-healed without signs of secondary infection.  Abdominal exam is fairly benign.    CT was performed to assess for recurrent obstruction.  While imaging is suggestive of possible early partial obstruction, the patient was evaluated by general surgery and declines admission today.  She has trialed oral fluids while in the emergency department without additional vomiting.  Her abdominal  repeat exam is stable.  Patient states that she is feeling much better and would prefer to follow-up with general surgery on an outpatient basis.  Despite imaging findings, clinical suspicion for SBO is low.  May have more of a persistent ileus following her prior operative management.  Return precautions discussed and provided. Patient discharged in stable condition with no unaddressed concerns.   Final Clinical Impressions(s) / ED Diagnoses   Final diagnoses:  Generalized abdominal pain    ED Discharge Orders    None       Antonietta Breach, PA-C 02/17/19 [id number]    Mesner, Corene Cornea, MD 02/17/19 [id number]

## 2019-04-28 ENCOUNTER — Encounter: Payer: Self-pay | Admitting: Internal Medicine

## 2019-06-25 ENCOUNTER — Encounter: Payer: Self-pay | Admitting: Internal Medicine

## 2019-08-28 ENCOUNTER — Other Ambulatory Visit: Payer: Self-pay | Admitting: Family

## 2019-08-28 DIAGNOSIS — R2681 Unsteadiness on feet: Secondary | ICD-10-CM

## 2019-08-28 DIAGNOSIS — R42 Dizziness and giddiness: Secondary | ICD-10-CM

## 2019-08-29 ENCOUNTER — Other Ambulatory Visit: Payer: Self-pay | Admitting: Family

## 2019-08-29 DIAGNOSIS — R42 Dizziness and giddiness: Secondary | ICD-10-CM

## 2019-08-29 DIAGNOSIS — R2681 Unsteadiness on feet: Secondary | ICD-10-CM

## 2019-08-30 ENCOUNTER — Ambulatory Visit
Admission: RE | Admit: 2019-08-30 | Discharge: 2019-08-30 | Disposition: A | Discharge status: Home / Self Care | Payer: Medicare Other | Source: Ambulatory Visit | Attending: Family | Admitting: Family

## 2019-08-30 ENCOUNTER — Other Ambulatory Visit: Payer: Medicare Other

## 2019-08-30 DIAGNOSIS — R2681 Unsteadiness on feet: Secondary | ICD-10-CM

## 2019-08-30 DIAGNOSIS — R42 Dizziness and giddiness: Secondary | ICD-10-CM

## 2019-09-03 ENCOUNTER — Other Ambulatory Visit: Payer: Self-pay | Admitting: Family

## 2019-09-03 DIAGNOSIS — N6002 Solitary cyst of left breast: Secondary | ICD-10-CM

## 2019-09-10 ENCOUNTER — Other Ambulatory Visit: Payer: Self-pay | Admitting: Family

## 2019-09-11 ENCOUNTER — Ambulatory Visit
Admission: RE | Admit: 2019-09-11 | Discharge: 2019-09-11 | Disposition: A | Discharge status: Home / Self Care | Payer: Medicare Other | Source: Ambulatory Visit | Attending: Family | Admitting: Family

## 2019-09-11 DIAGNOSIS — R42 Dizziness and giddiness: Secondary | ICD-10-CM

## 2019-09-11 DIAGNOSIS — R2681 Unsteadiness on feet: Secondary | ICD-10-CM

## 2019-09-25 ENCOUNTER — Other Ambulatory Visit: Payer: Self-pay | Admitting: Family

## 2019-09-25 ENCOUNTER — Ambulatory Visit
Admission: RE | Admit: 2019-09-25 | Discharge: 2019-09-25 | Disposition: A | Discharge status: Home / Self Care | Payer: Medicare Other | Source: Ambulatory Visit | Attending: Family | Admitting: Family

## 2019-09-25 ENCOUNTER — Other Ambulatory Visit: Payer: Self-pay

## 2019-09-25 DIAGNOSIS — N6002 Solitary cyst of left breast: Secondary | ICD-10-CM

## 2019-09-25 DIAGNOSIS — N6310 Unspecified lump in the right breast, unspecified quadrant: Secondary | ICD-10-CM

## 2019-10-05 ENCOUNTER — Ambulatory Visit
Admission: RE | Admit: 2019-10-05 | Discharge: 2019-10-05 | Disposition: A | Discharge status: Home / Self Care | Payer: Medicare Other | Source: Ambulatory Visit | Attending: Family | Admitting: Family

## 2019-10-05 ENCOUNTER — Other Ambulatory Visit: Payer: Self-pay

## 2019-10-05 DIAGNOSIS — N6310 Unspecified lump in the right breast, unspecified quadrant: Secondary | ICD-10-CM

## 2019-11-02 ENCOUNTER — Other Ambulatory Visit: Payer: Self-pay | Admitting: Surgery

## 2019-11-02 DIAGNOSIS — D241 Benign neoplasm of right breast: Secondary | ICD-10-CM

## 2019-11-05 ENCOUNTER — Other Ambulatory Visit: Payer: Self-pay | Admitting: Surgery

## 2019-11-05 DIAGNOSIS — D241 Benign neoplasm of right breast: Secondary | ICD-10-CM

## 2019-11-30 ENCOUNTER — Inpatient Hospital Stay (HOSPITAL_COMMUNITY): Admission: RE | Admit: 2019-11-30 | Payer: Medicare Other | Source: Ambulatory Visit

## 2019-12-03 ENCOUNTER — Other Ambulatory Visit (HOSPITAL_COMMUNITY): Payer: Medicare Other

## 2019-12-06 ENCOUNTER — Ambulatory Visit: Admit: 2019-12-06 | Payer: Medicare Other | Admitting: Surgery

## 2019-12-06 SURGERY — BREAST LUMPECTOMY WITH RADIOACTIVE SEED LOCALIZATION
Anesthesia: General | Site: Breast | Laterality: Right

## 2020-06-21 IMAGING — MG MM BREAST LOCALIZATION CLIP
4 series · 4 of 12 positions shown · non-contrast
Comparison: Prior studies

CLINICAL DATA: Evaluate RIBBON clip placement following
ultrasound-guided RIGHT breast biopsy.

EXAM:
DIAGNOSTIC RIGHT MAMMOGRAM POST ULTRASOUND BIOPSY

[R ML synth-2D]
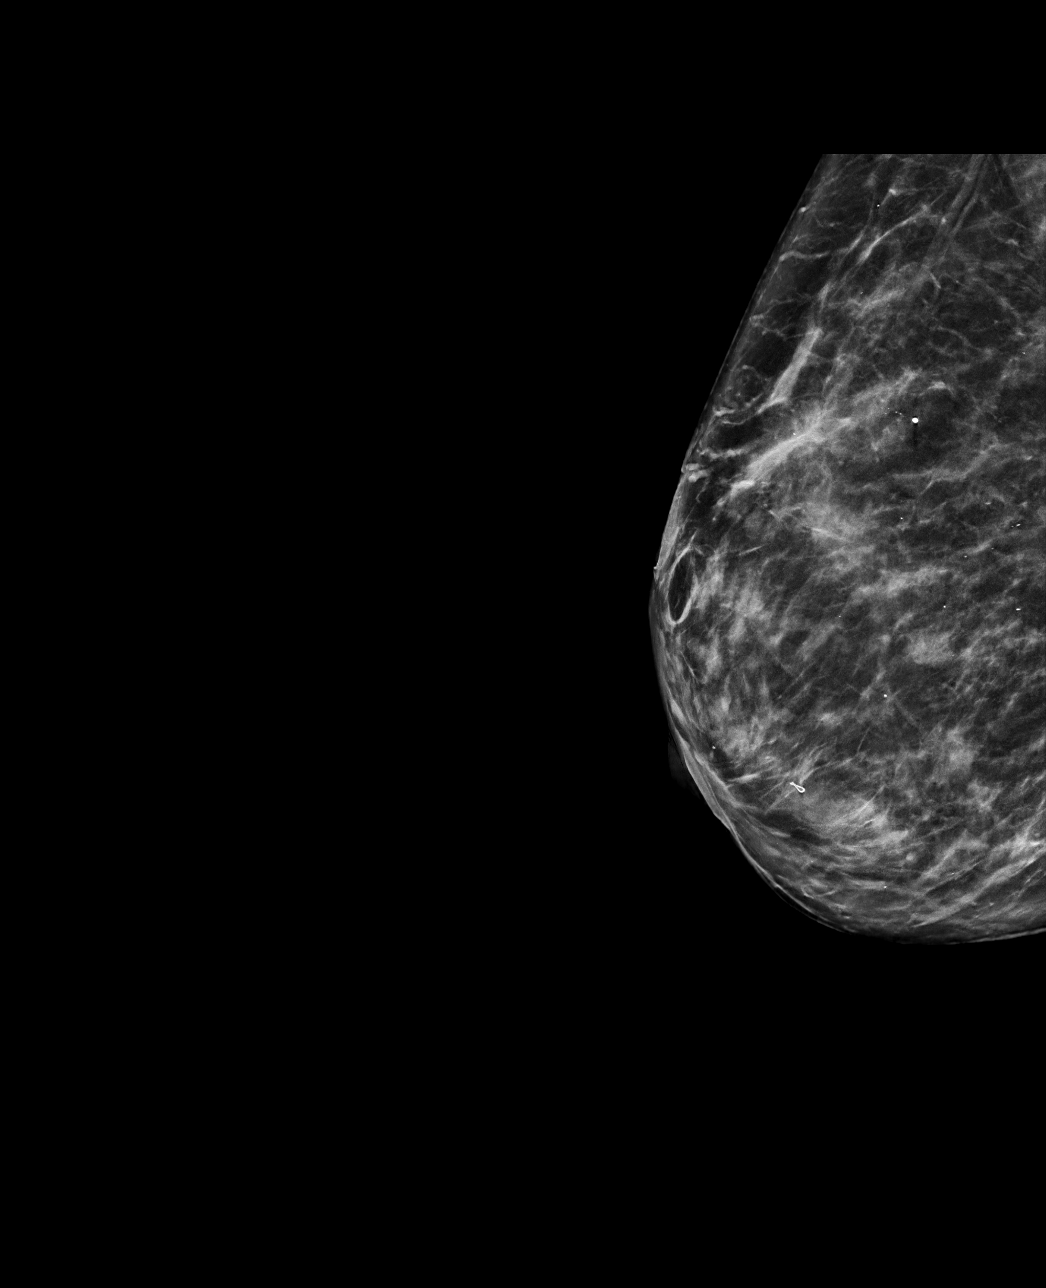

[R CC synth-2D]
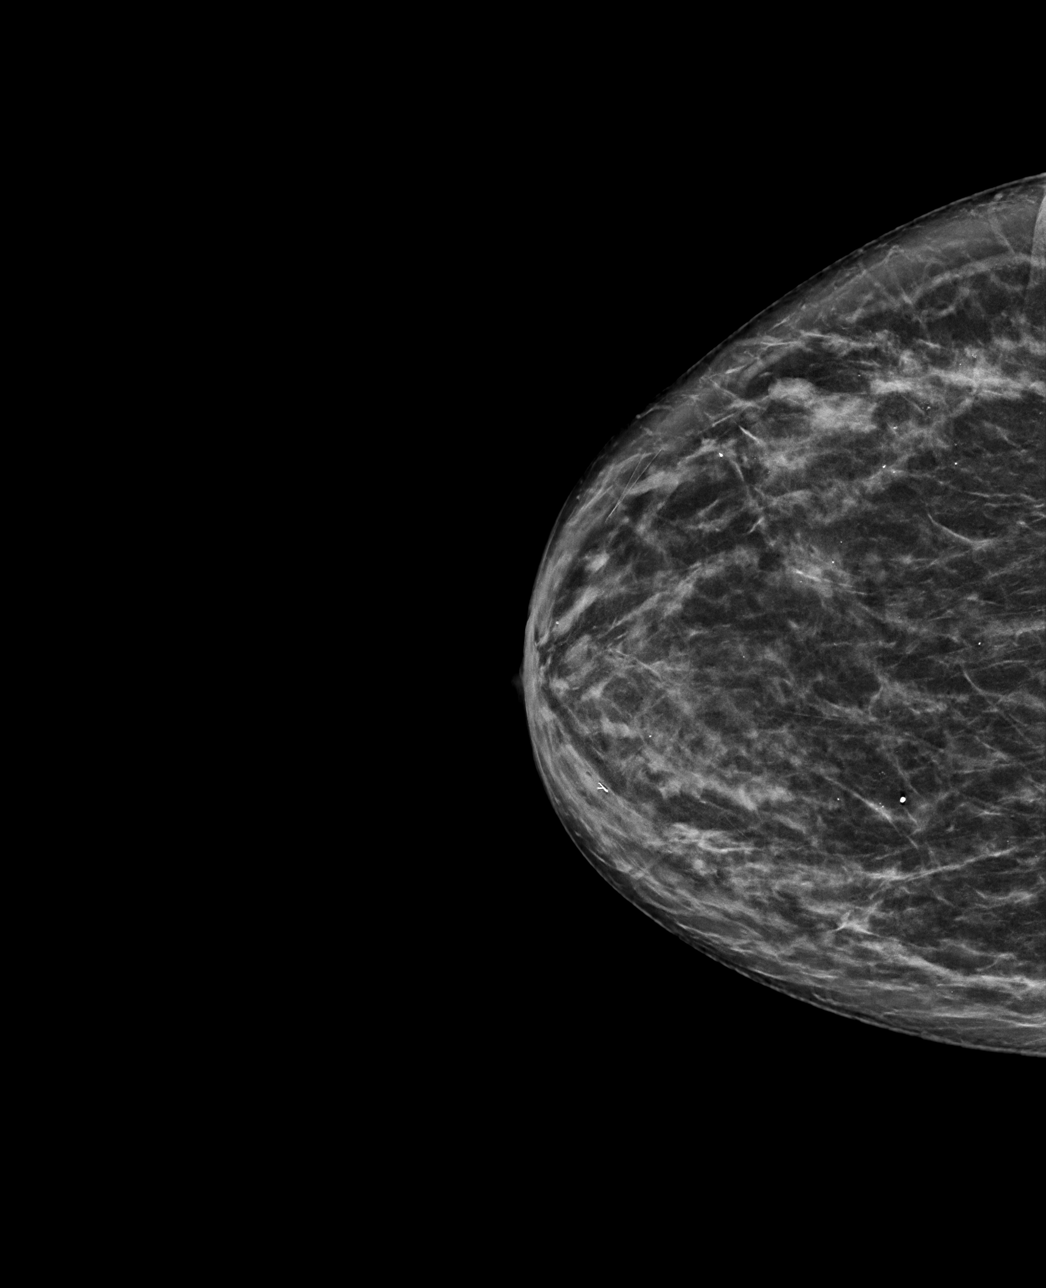

[R CC tomo · tomo slice 41/81.0]
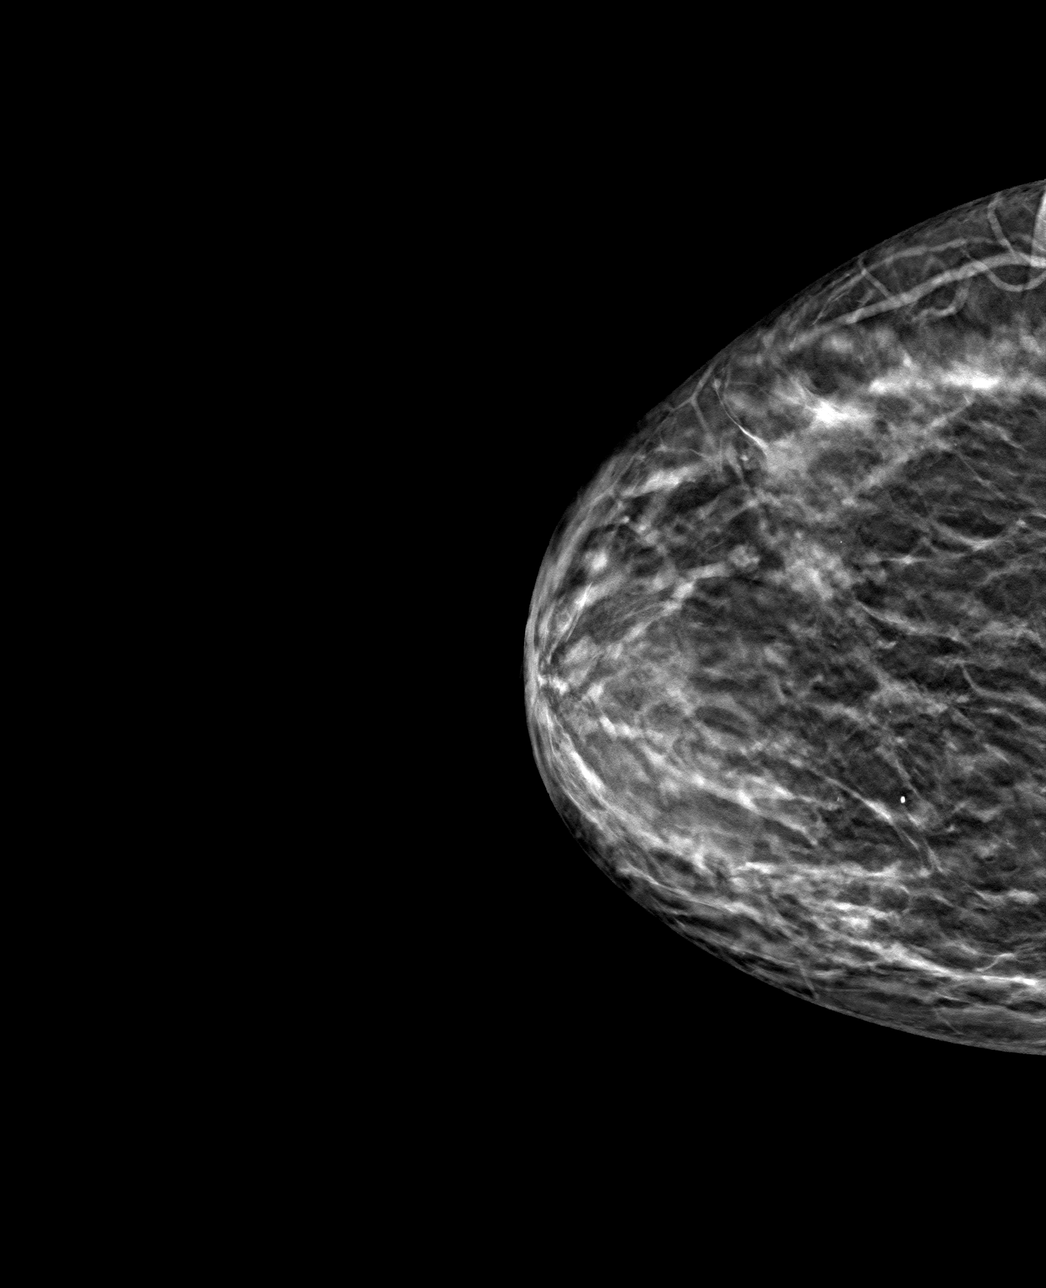

[R ML tomo · tomo slice 43/85.0]
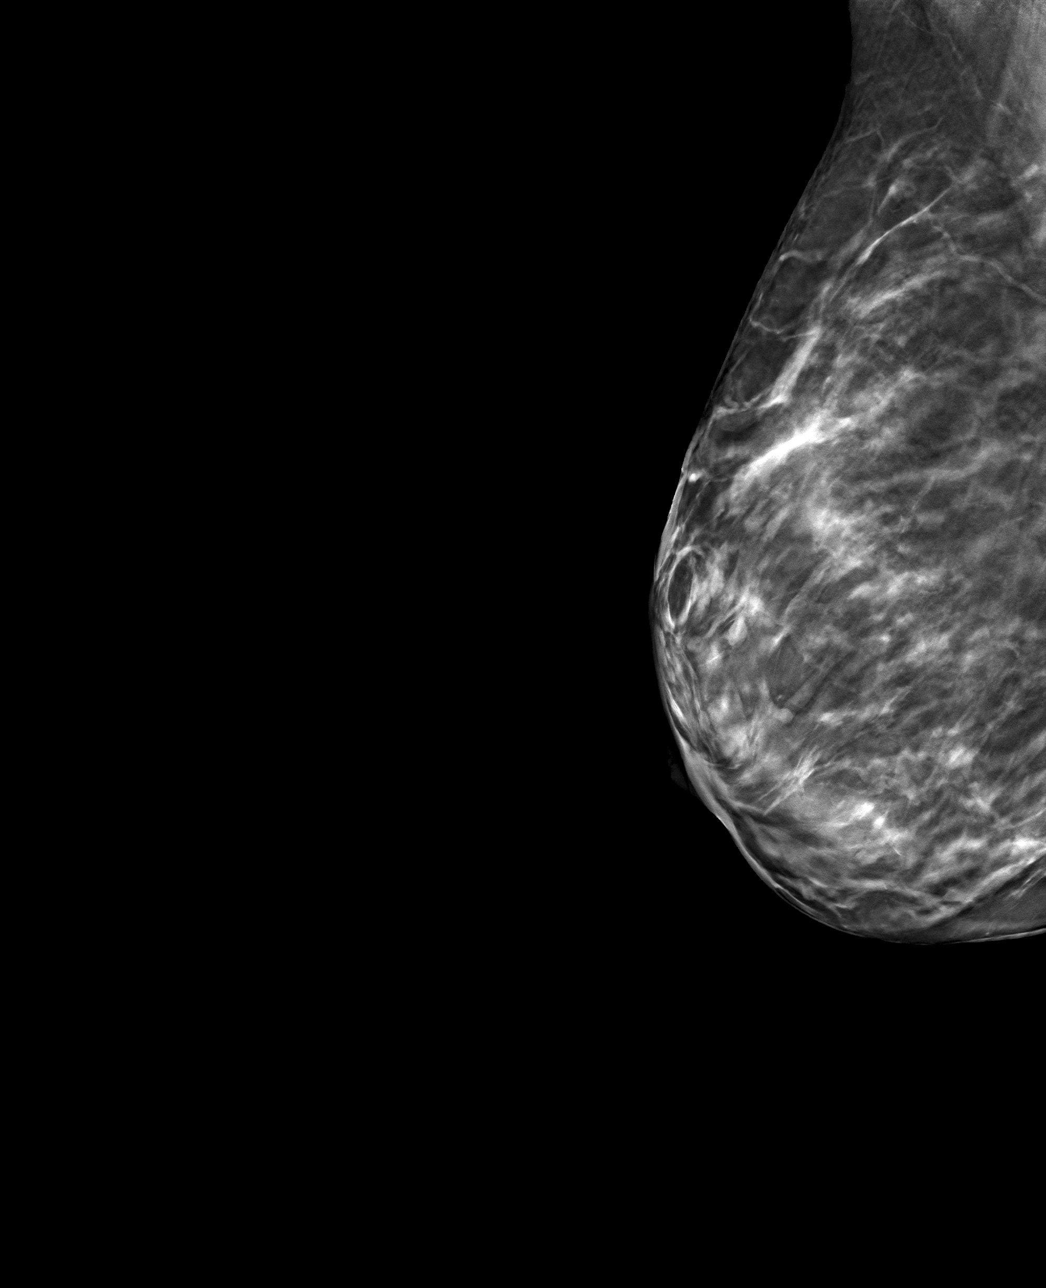

[4 of 12 positions shown; findings below may reference images not displayed]

FINDINGS: Mammographic images were obtained following ultrasound guided biopsy
of the 0.7 cm intraductal mass in the INNER RETROAREOLAR RIGHT
breast. The RIBBON biopsy marking clip is in expected position at
the site of biopsy.
IMPRESSION: Appropriate positioning of the RIBBON shaped biopsy marking clip at
the site of biopsy in the INNER RETROAREOLAR RIGHT breast.

Final Assessment: Post Procedure Mammograms for Marker Placement

## 2020-06-21 IMAGING — US US  BREAST BX W/ LOC DEV 1ST LESION IMG BX SPEC US GUIDE*R*
1 series · 12 of 12 positions shown · non-contrast
Comparison: Previous exam(s).
COMPARISON: Previous exam(s).

Addendum:
CLINICAL DATA: 68-year-old female for tissue sampling of
intraductal mass in the INNER RETROAREOLAR RIGHT breast.

EXAM:
ULTRASOUND GUIDED RIGHT BREAST CORE NEEDLE BIOPSY

[Series 1: us breast bx w/ loc dev 1st lesion img bx spec us  · 0.05mm/px · 12 of 12 slices shown]
[im 1/12]
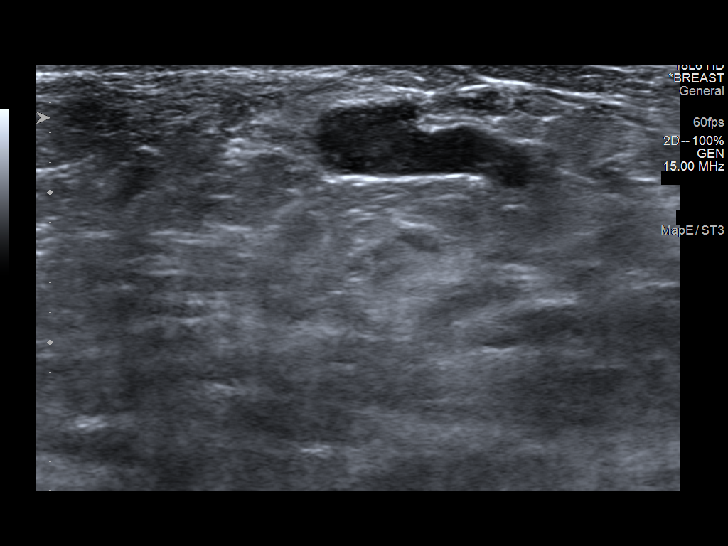
[im 2/12]
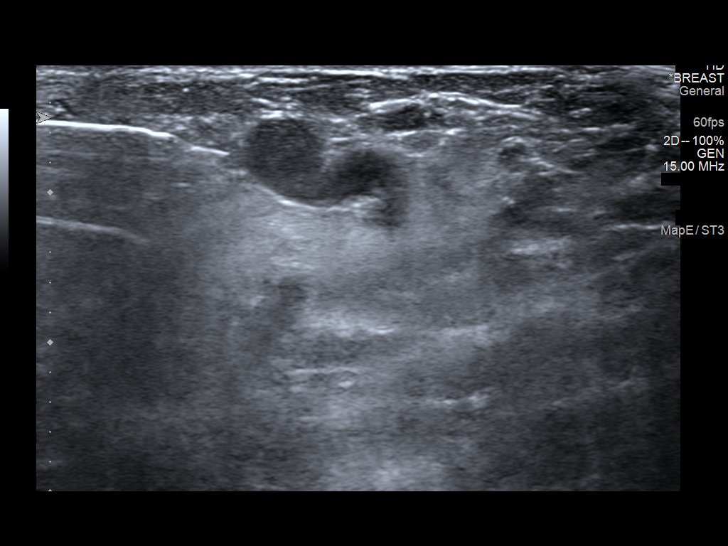
[im 3/12]
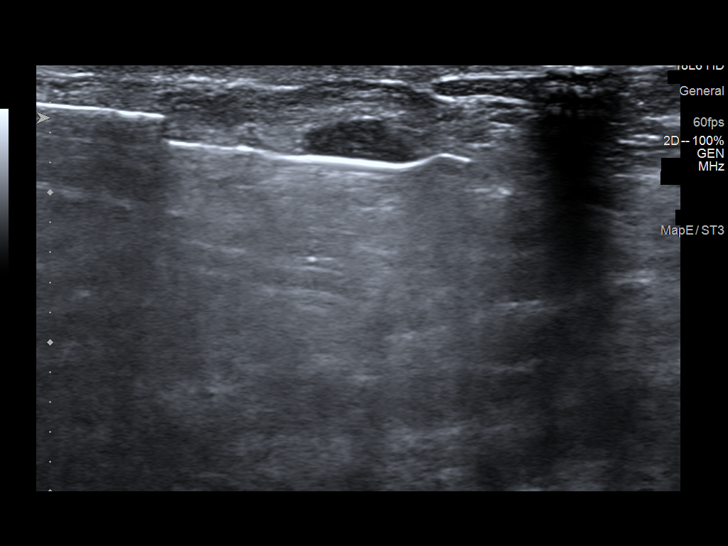
[im 4/12]
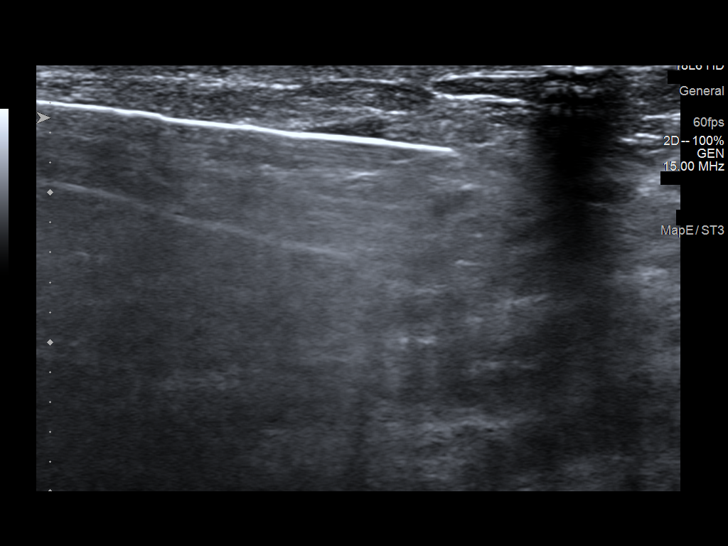
[im 5/12]
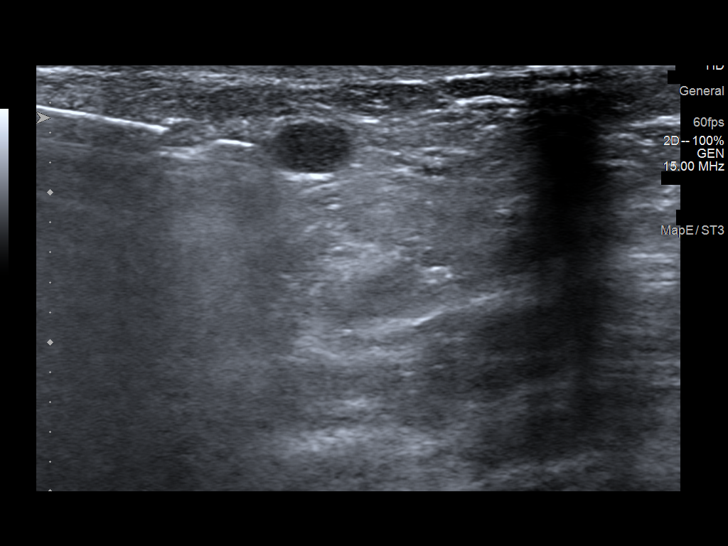
[im 6/12]
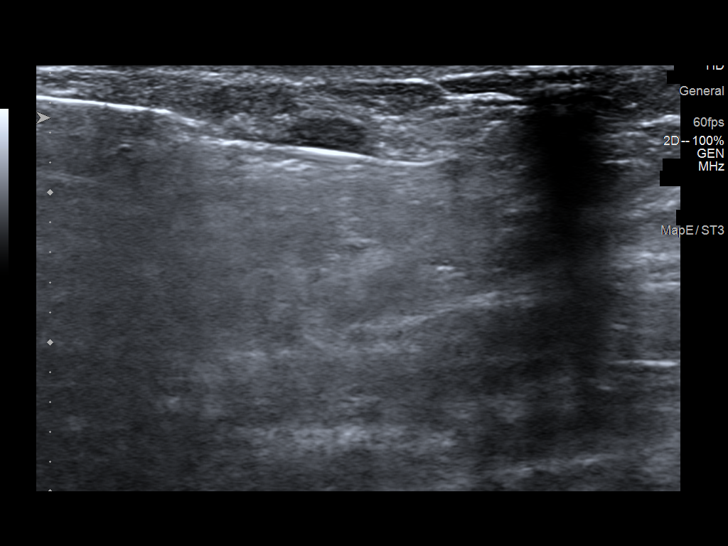
[im 7/12]
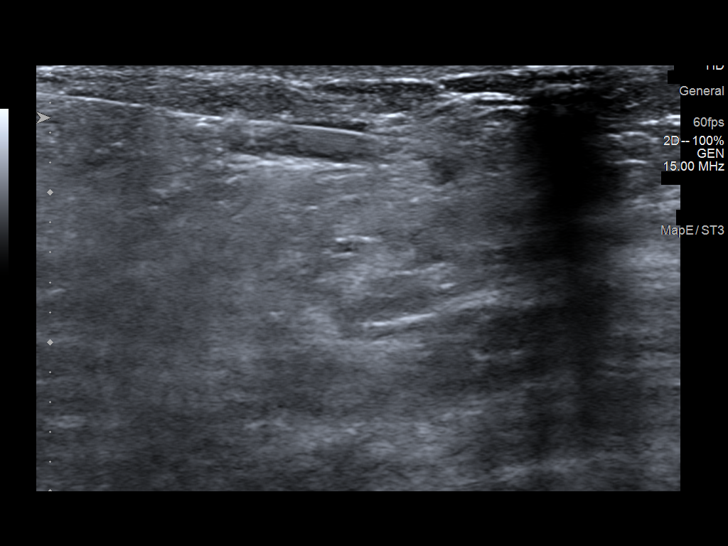
[im 8/12]
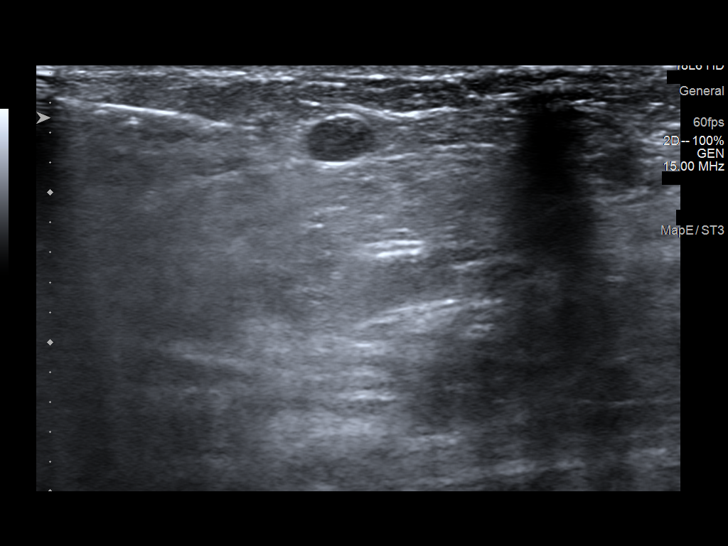
[im 9/12]
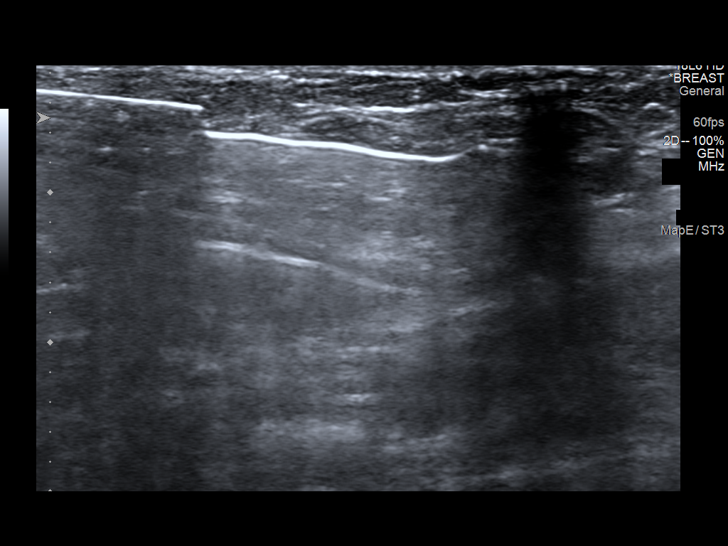
[im 10/12]
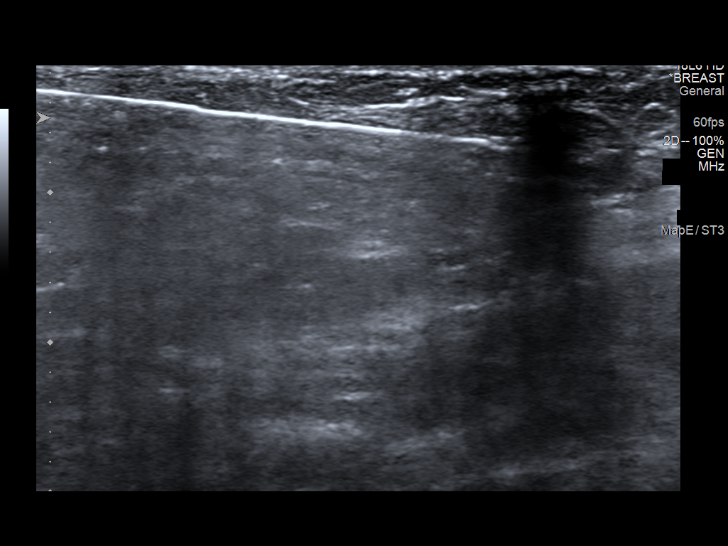
[im 11/12]
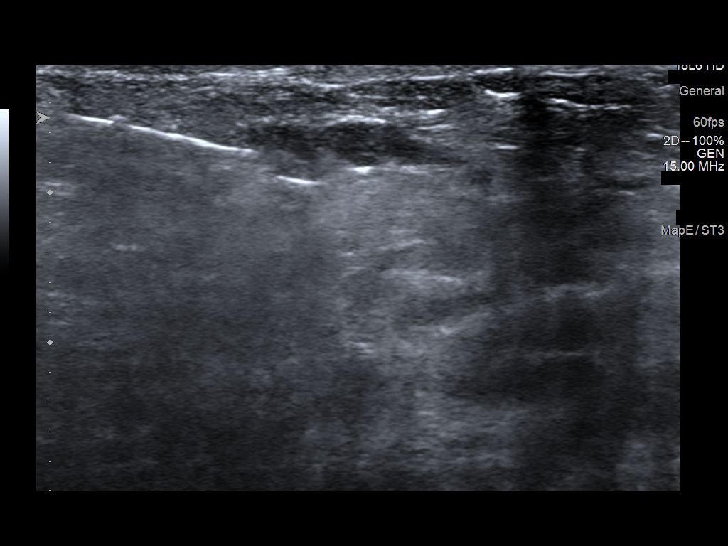
[im 12/12]
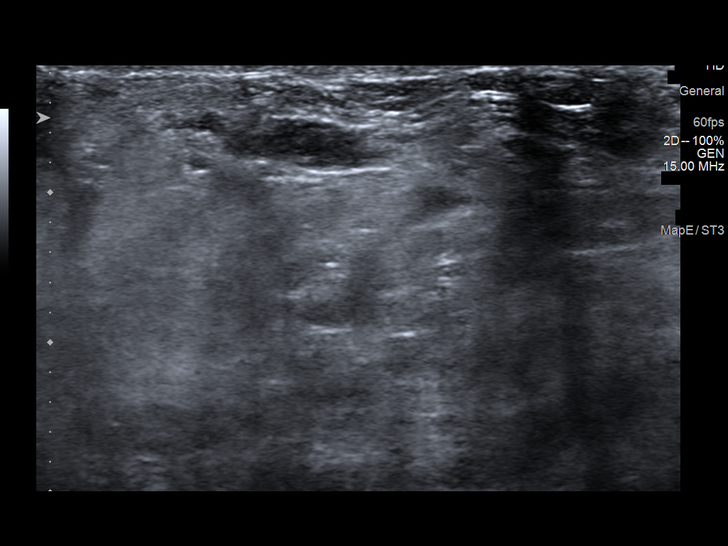

[12 of 12 positions shown; findings below may reference images not displayed]



Using sterile technique and 1% Lidocaine as local anesthetic, under
direct ultrasound visualization, a 14 gauge Kadhafi device was
used to perform biopsy of the 0.7 cm intraductal mass using a
SUPERIOR approach. At the conclusion of the procedure a RIBBON
tissue marker clip was deployed into the biopsy cavity. Follow up 2
view mammogram was performed and dictated separately.
IMPRESSION: Ultrasound guided biopsy of a 0.7 cm INNER RETROAREOLAR RIGHT breast
mass. No apparent complications.

ADDENDUM:
Pathology revealed DUCTAL PAPILLOMA of the RIGHT breast, inner
retroareolar. This was found to be concordant by Dr. Helane Spadaro, with
excision recommended.

Pathology results were discussed with the patient by telephone. The
patient reported doing well after the biopsy with tenderness and
minimal bleeding at the site. Post biopsy instructions and care were
reviewed and questions were answered. The patient was encouraged to
call The [REDACTED] for any additional
concerns.

Surgical consultation has been arranged with Dr. Marjune Morikawa at
[REDACTED] on November 02, 2019.

Pathology results reported by Kimo Grana, RN on 10/10/2019.



Using sterile technique and 1% Lidocaine as local anesthetic, under
direct ultrasound visualization, a 14 gauge Kadhafi device was
used to perform biopsy of the 0.7 cm intraductal mass using a
SUPERIOR approach. At the conclusion of the procedure a RIBBON
tissue marker clip was deployed into the biopsy cavity. Follow up 2
view mammogram was performed and dictated separately.
IMPRESSION: Ultrasound guided biopsy of a 0.7 cm INNER RETROAREOLAR RIGHT breast
mass. No apparent complications.

## 2020-07-01 ENCOUNTER — Ambulatory Visit (HOSPITAL_COMMUNITY)
Admission: RE | Admit: 2020-07-01 | Discharge: 2020-07-01 | Disposition: A | Discharge status: Home / Self Care | Payer: Medicare Other | Source: Home / Self Care | Attending: Psychiatry | Admitting: Psychiatry

## 2020-07-01 ENCOUNTER — Emergency Department (HOSPITAL_COMMUNITY)
Admission: EM | Admit: 2020-07-01 | Discharge: 2020-07-02 | Disposition: A | Discharge status: Other Acute Inpatient Hospital | Payer: Medicare Other | Attending: Emergency Medicine | Admitting: Emergency Medicine

## 2020-07-01 ENCOUNTER — Other Ambulatory Visit: Payer: Self-pay

## 2020-07-01 DIAGNOSIS — R000 Tachycardia, unspecified: Secondary | ICD-10-CM | POA: Insufficient documentation

## 2020-07-01 DIAGNOSIS — F419 Anxiety disorder, unspecified: Secondary | ICD-10-CM | POA: Insufficient documentation

## 2020-07-01 DIAGNOSIS — Z9151 Personal history of suicidal behavior: Secondary | ICD-10-CM | POA: Insufficient documentation

## 2020-07-01 DIAGNOSIS — T50902A Poisoning by unspecified drugs, medicaments and biological substances, intentional self-harm, initial encounter: Secondary | ICD-10-CM | POA: Insufficient documentation

## 2020-07-01 DIAGNOSIS — R45851 Suicidal ideations: Secondary | ICD-10-CM | POA: Insufficient documentation

## 2020-07-01 DIAGNOSIS — Z79899 Other long term (current) drug therapy: Secondary | ICD-10-CM | POA: Insufficient documentation

## 2020-07-01 DIAGNOSIS — Z20822 Contact with and (suspected) exposure to covid-19: Secondary | ICD-10-CM | POA: Insufficient documentation

## 2020-07-01 DIAGNOSIS — F341 Dysthymic disorder: Secondary | ICD-10-CM | POA: Insufficient documentation

## 2020-07-01 DIAGNOSIS — R450 Nervousness: Secondary | ICD-10-CM | POA: Insufficient documentation

## 2020-07-01 DIAGNOSIS — Z046 Encounter for general psychiatric examination, requested by authority: Secondary | ICD-10-CM | POA: Diagnosis present

## 2020-07-01 DIAGNOSIS — G4700 Insomnia, unspecified: Secondary | ICD-10-CM | POA: Insufficient documentation

## 2020-07-01 DIAGNOSIS — F314 Bipolar disorder, current episode depressed, severe, without psychotic features: Secondary | ICD-10-CM | POA: Insufficient documentation

## 2020-07-01 DIAGNOSIS — I10 Essential (primary) hypertension: Secondary | ICD-10-CM | POA: Diagnosis not present

## 2020-07-01 DIAGNOSIS — F3163 Bipolar disorder, current episode mixed, severe, without psychotic features: Secondary | ICD-10-CM | POA: Insufficient documentation

## 2020-07-01 LAB — CBC WITH DIFFERENTIAL/PLATELET
Abs Immature Granulocytes: 0.01 10*3/uL (ref 0.00–0.07)
Basophils Absolute: 0 10*3/uL (ref 0.0–0.1)
Basophils Relative: 1 %
Eosinophils Absolute: 0 10*3/uL (ref 0.0–0.5)
Eosinophils Relative: 1 %
HCT: 37.9 % (ref 36.0–46.0)
Hemoglobin: 11.8 g/dL — ABNORMAL LOW (ref 12.0–15.0)
Immature Granulocytes: 0 %
Lymphocytes Relative: 36 %
Lymphs Abs: 1.7 10*3/uL (ref 0.7–4.0)
MCH: 26 pg (ref 26.0–34.0)
MCHC: 31.1 g/dL (ref 30.0–36.0)
MCV: 83.7 fL (ref 80.0–100.0)
Monocytes Absolute: 0.4 10*3/uL (ref 0.1–1.0)
Monocytes Relative: 8 %
Neutro Abs: 2.5 10*3/uL (ref 1.7–7.7)
Neutrophils Relative %: 54 %
Platelets: 306 10*3/uL (ref 150–400)
RBC: 4.53 MIL/uL (ref 3.87–5.11)
RDW: 15.3 % (ref 11.5–15.5)
WBC: 4.6 10*3/uL (ref 4.0–10.5)
nRBC: 0 % (ref 0.0–0.2)

## 2020-07-01 LAB — COMPREHENSIVE METABOLIC PANEL
ALT: 19 U/L (ref 0–44)
AST: 17 U/L (ref 15–41)
Albumin: 4.6 g/dL (ref 3.5–5.0)
Alkaline Phosphatase: 65 U/L (ref 38–126)
Anion gap: 12 (ref 5–15)
BUN: 16 mg/dL (ref 8–23)
CO2: 19 mmol/L — ABNORMAL LOW (ref 22–32)
Calcium: 11.1 mg/dL — ABNORMAL HIGH (ref 8.9–10.3)
Chloride: 110 mmol/L (ref 98–111)
Creatinine, Ser: 1.01 mg/dL — ABNORMAL HIGH (ref 0.44–1.00)
GFR, Estimated: 60 mL/min — ABNORMAL LOW (ref >60)
Glucose, Bld: 118 mg/dL — ABNORMAL HIGH (ref 70–99)
Potassium: 3.8 mmol/L (ref 3.5–5.1)
Sodium: 141 mmol/L (ref 135–145)
Total Bilirubin: 2.1 mg/dL — ABNORMAL HIGH (ref 0.3–1.2)
Total Protein: 7.9 g/dL (ref 6.5–8.1)

## 2020-07-01 LAB — ETHANOL: Alcohol, Ethyl (B): <10 mg/dL (ref <10)

## 2020-07-01 LAB — ACETAMINOPHEN LEVEL: Acetaminophen (Tylenol), Serum: <10 ug/mL — ABNORMAL LOW (ref 10–30)

## 2020-07-01 LAB — SALICYLATE LEVEL: Salicylate Lvl: <7 mg/dL — ABNORMAL LOW (ref 7.0–30.0)

## 2020-07-01 MED ORDER — QUETIAPINE FUMARATE 50 MG PO TABS: 50.0000 mg | ORAL_TABLET | Freq: Every day | ORAL | Status: DC

## 2020-07-01 MED ORDER — DULOXETINE HCL 30 MG PO CPEP
60.0000 mg | ORAL_CAPSULE | Freq: Every day | ORAL | Status: DC
  Administered 2020-07-02: 60 mg via ORAL
  Filled 2020-07-01: qty 2

## 2020-07-01 MED ORDER — ACETAMINOPHEN 500 MG PO TABS: 500.0000 mg | ORAL_TABLET | Freq: Three times a day (TID) | ORAL | Status: DC | PRN

## 2020-07-01 MED ORDER — ACETAMINOPHEN 325 MG PO TABS: 650.0000 mg | ORAL_TABLET | Freq: Four times a day (QID) | ORAL | Status: DC | PRN

## 2020-07-01 MED ORDER — LOSARTAN POTASSIUM 50 MG PO TABS: 100.0000 mg | ORAL_TABLET | Freq: Every day | ORAL | Status: DC

## 2020-07-01 MED ORDER — QUETIAPINE FUMARATE 50 MG PO TABS: 50.0000 mg | ORAL_TABLET | Freq: Two times a day (BID) | ORAL | Status: DC | PRN

## 2020-07-01 MED ORDER — ALUM & MAG HYDROXIDE-SIMETH 200-200-20 MG/5ML PO SUSP: 30.0000 mL | ORAL | Status: DC | PRN

## 2020-07-01 MED ORDER — PANTOPRAZOLE SODIUM 40 MG PO TBEC
40.0000 mg | DELAYED_RELEASE_TABLET | Freq: Every day | ORAL | Status: DC
  Administered 2020-07-02: 40 mg via ORAL
  Filled 2020-07-01: qty 1

## 2020-07-01 MED ORDER — MAGNESIUM HYDROXIDE 400 MG/5ML PO SUSP: 30.0000 mL | Freq: Every day | ORAL | Status: DC | PRN

## 2020-07-01 MED ORDER — DULOXETINE HCL 60 MG PO CPEP: 60.0000 mg | ORAL_CAPSULE | Freq: Every day | ORAL | Status: DC

## 2020-07-01 MED ORDER — CALCIUM CARBONATE-VITAMIN D 500-200 MG-UNIT PO TABS
1.0000 | ORAL_TABLET | Freq: Every day | ORAL | Status: DC
  Administered 2020-07-02: 1 via ORAL
  Filled 2020-07-01: qty 1

## 2020-07-01 MED ORDER — ATORVASTATIN CALCIUM 10 MG PO TABS
10.0000 mg | ORAL_TABLET | Freq: Every day | ORAL | Status: DC
  Administered 2020-07-02: 10 mg via ORAL
  Filled 2020-07-01: qty 1

## 2020-07-01 MED ORDER — LORAZEPAM 1 MG PO TABS: 1.0000 mg | ORAL_TABLET | Freq: Once | ORAL | Status: DC | PRN

## 2020-07-01 MED ORDER — LORAZEPAM 1 MG PO TABS
1.0000 mg | ORAL_TABLET | Freq: Once | ORAL | Status: AC
  Administered 2020-07-01: 1 mg via ORAL
  Filled 2020-07-01: qty 1

## 2020-07-01 MED ORDER — PANTOPRAZOLE SODIUM 40 MG PO TBEC: 40.0000 mg | DELAYED_RELEASE_TABLET | Freq: Every day | ORAL | Status: DC

## 2020-07-01 MED ORDER — LOSARTAN POTASSIUM 25 MG PO TABS
100.0000 mg | ORAL_TABLET | Freq: Every day | ORAL | Status: DC
  Administered 2020-07-02: 100 mg via ORAL
  Filled 2020-07-01: qty 4

## 2020-07-01 MED ORDER — CHOLECALCIFEROL 10 MCG (400 UNIT) PO TABS: 400.0000 [IU] | ORAL_TABLET | Freq: Every day | ORAL | Status: DC

## 2020-07-01 MED ORDER — VITAMIN B-12 1000 MCG PO TABS
1000.0000 ug | ORAL_TABLET | Freq: Every day | ORAL | Status: DC
  Administered 2020-07-02: 1000 ug via ORAL
  Filled 2020-07-01: qty 1

## 2020-07-01 MED ORDER — ATORVASTATIN CALCIUM 10 MG PO TABS: 10.0000 mg | ORAL_TABLET | Freq: Every day | ORAL | Status: DC

## 2020-07-01 MED ORDER — TRAZODONE HCL 50 MG PO TABS: 50.0000 mg | ORAL_TABLET | Freq: Every evening | ORAL | Status: DC | PRN

## 2020-07-01 MED ORDER — HYDROXYZINE HCL 25 MG PO TABS: 25.0000 mg | ORAL_TABLET | Freq: Three times a day (TID) | ORAL | Status: DC | PRN

## 2020-07-01 NOTE — ED Provider Notes (Signed)
[facility] DEPT Provider Note   CSN: [id number] Arrival date & time: 07/01/20  2030     History Chief Complaint  Patient presents with  . Suicidal    [name] is a 70 y.o. female.  70 year old female with a history of anxiety, depression, hypertension presents to the emergency department for suicidal ideations.  She has been having more suicidal thoughts since coming off her lithium 1 year ago.  She was taken off this medication secondary to reported lithium toxicity.  Does have a history of suicide attempt by overdose more than 10 years ago.  She tried reaching out to her psychiatrist today, but never had a call back from her.  Was brought to [facility] for evaluation today by her husband.  Denies ETOH and drug use.  Patient sent to ED as no [facility] beds available and not deemed age appropriate for [facility]. Pending inpatient placement.       Past Medical History:  Diagnosis Date  . Anxiety   . Depression   . Hx of adenomatous colonic polyps 05/14/2015  . Hypertension   . Papilloma of breast    right  . Prolapsed internal hemorrhoids, grade 3 05/09/2015  . SBO (small bowel obstruction) ([place]) 01/19/2019  . Vitamin D deficiency     Patient Active Problem List   Diagnosis Date Noted  . Bipolar 1 disorder, depressed, severe ([place]) 07/01/2020  . Prediabetes 01/24/2019  . Normocytic anemia 01/24/2019  . Hypernatremia 01/24/2019  . Hyperchloremia 01/24/2019  . Renal insufficiency 01/24/2019  . Obesity (BMI 30-39.9) 01/24/2019  . HTN (hypertension) 01/20/2019  . HLD (hyperlipidemia) 01/20/2019  . Hx of adenomatous colonic polyps 05/14/2015  . Prolapsed internal hemorrhoids, grade 3 05/09/2015    Past Surgical History:  Procedure Laterality Date  . ABDOMINAL HYSTERECTOMY    . ANKLE FRACTURE SURGERY    . BREAST LUMPECTOMY WITH RADIOACTIVE SEED LOCALIZATION Right 05/06/2015   Procedure: RIGHT BREAST LUMPECTOMY WITH RADIOACTIVE SEED LOCALIZATION;   Surgeon: Coralie Keens, MD;  Location: [facility];  Service: General;  Laterality: Right;  . COLONOSCOPY    . ESOPHAGOGASTRODUODENOSCOPY    . FINGER FRACTURE SURGERY    . HEMORRHOID BANDING    . LAPAROSCOPY N/A 01/22/2019   Procedure: LAPAROSCOPY DIAGNOSTIC;  Surgeon: Greer Pickerel, MD;  Location: [facility];  Service: General;  Laterality: N/A;  . LAPAROTOMY N/A 01/22/2019   Procedure: EXPLORATORY LAPAROTOMY;  Surgeon: Greer Pickerel, MD;  Location: [facility];  Service: General;  Laterality: N/A;  . LYSIS OF ADHESION  01/22/2019   Procedure: Lysis Of  Adhesions x 1 hour;  Surgeon: Greer Pickerel, MD;  Location: [facility];  Service: General;;  . OVARIAN CYST REMOVAL    . TONSILLECTOMY AND ADENOIDECTOMY       OB History   No obstetric history on file.     Family History  Problem Relation Age of Onset  . Breast cancer Mother   . Colon cancer Neg Hx     Social History   Tobacco Use  . Smoking status: Never Smoker  . Smokeless tobacco: Never Used  Substance Use Topics  . Alcohol use: No    Alcohol/week: 0.0 standard drinks  . Drug use: No    Home Medications Prior to Admission medications   Medication Sig Start Date End Date Taking? Authorizing Provider  acetaminophen (TYLENOL) 500 MG tablet You can take 1000 mg of Tylenol every 8 hours as needed for pain.  He can buy this over-the-counter at  any drugstore without a prescription.  Do not take more than 4000 mg of Tylenol per day it can harm your liver. 01/29/19  Yes Earnstine Regal, PA-C  atorvastatin (LIPITOR) 10 MG tablet Take 10 mg by mouth daily. 12/22/18  Yes [provider]  Calcium Carbonate-Vitamin D 600-400 MG-UNIT tablet Take 1 tablet by mouth daily.   Yes [provider]  DULoxetine (CYMBALTA) 60 MG capsule Take 60 mg by mouth daily.   Yes [provider]  losartan (COZAAR) 100 MG tablet Take 100 mg by mouth daily. 03/31/20  Yes [provider]  pantoprazole (PROTONIX) 40 MG tablet  Take 1 tablet (40 mg total) by mouth daily. 01/30/19  Yes Sheikh, Omair Latif, DO  vitamin B-12 (CYANOCOBALAMIN) 1000 MCG tablet Take 1,000 mcg by mouth daily.   Yes [provider]    Allergies    Nickel, Penicillins, and Wool alcohol [lanolin]  Review of Systems   Review of Systems  Ten systems reviewed and are negative for acute change, except as noted in the HPI.    Physical Exam Updated Vital Signs BP (!) 137/107 (BP Location: Left Arm)   Pulse (!) 109   Temp 98 F (36.7 C) (Oral)   Resp 18   Ht 5\' 2"  (1.575 m)   Wt 80 kg   SpO2 100%   BMI 32.26 kg/m   Physical Exam Vitals and nursing note reviewed.  Constitutional:      General: She is not in acute distress.    Appearance: She is well-developed and well-nourished. She is not diaphoretic.     Comments: Nontoxic appearing and in NAD  HENT:     Head: Normocephalic and atraumatic.  Eyes:     General: No scleral icterus.    Extraocular Movements: EOM normal.     Conjunctiva/sclera: Conjunctivae normal.  Pulmonary:     Effort: Pulmonary effort is normal. No respiratory distress.     Comments: Respirations even and unlabored Musculoskeletal:        General: Normal range of motion.     Cervical back: Normal range of motion.  Skin:    General: Skin is warm and dry.     Coloration: Skin is not pale.     Findings: No erythema or rash.  Neurological:     Mental Status: She is alert and oriented to person, place, and time.  Psychiatric:        Mood and Affect: Mood is anxious.        Speech: Speech normal.        Behavior: Behavior is agitated. Behavior is cooperative.        Thought Content: Thought content includes suicidal ideation. Thought content does not include homicidal ideation. Thought content includes suicidal (overdose) plan.     ED Results / Procedures / Treatments   Labs (all labs ordered are listed, but only abnormal results are displayed) Labs Reviewed  COMPREHENSIVE METABOLIC PANEL -  Abnormal; Notable for the following components:      Result Value   CO2 19 (*)    Glucose, Bld 118 (*)    Creatinine, Ser 1.01 (*)    Calcium 11.1 (*)    Total Bilirubin 2.1 (*)    GFR, Estimated 60 (*)    All other components within normal limits  SALICYLATE LEVEL - Abnormal; Notable for the following components:   Salicylate Lvl [id number] (*)    All other components within normal limits  ACETAMINOPHEN LEVEL - Abnormal; Notable for the following  components:   Acetaminophen (Tylenol), Serum <10 (*)    All other components within normal limits  CBC WITH DIFFERENTIAL/PLATELET - Abnormal; Notable for the following components:   Hemoglobin 11.8 (*)    All other components within normal limits  RESP PANEL BY RT-PCR (FLU A&B, COVID) ARPGX2  ETHANOL  RAPID URINE DRUG SCREEN, HOSP PERFORMED    EKG None  Radiology No results found.  Procedures Procedures (including critical care time)  Medications Ordered in ED Medications  acetaminophen (TYLENOL) tablet 500 mg (has no administration in time range)  atorvastatin (LIPITOR) tablet 10 mg (has no administration in time range)  calcium-vitamin D (OSCAL WITH D) 500-200 MG-UNIT per tablet 1 tablet (has no administration in time range)  pantoprazole (PROTONIX) EC tablet 40 mg (has no administration in time range)  losartan (COZAAR) tablet 100 mg (has no administration in time range)  DULoxetine (CYMBALTA) DR capsule 60 mg (has no administration in time range)  vitamin B-12 (CYANOCOBALAMIN) tablet 1,000 mcg (has no administration in time range)  LORazepam (ATIVAN) tablet 1 mg (1 mg Oral Given 07/01/20 2354)    ED Course  I have reviewed the triage vital signs and the nursing notes.  Pertinent labs & imaging results that were available during my care of the patient were reviewed by me and considered in my medical decision making (see chart for details).    MDM Rules/Calculators/A&P                          Patient pending inpatient  placement for continued psychiatric care.  Was evaluated earlier today for suicidal ideations which have been worse since she discontinued her lithium 1 year ago.  Medically cleared while in the ED.  Daily medications ordered.  Disposition to be determined by oncoming ED provider.   Final Clinical Impression(s) / ED Diagnoses Final diagnoses:  Suicidal ideation    Rx / DC Orders ED Discharge Orders    None       Antony Madura, PA-C 07/02/20 0559    Mesner, Barbara Cower, MD 07/02/20 [id number]

## 2020-07-01 NOTE — ED Triage Notes (Signed)
Per pt; she has a hx of SI and overdose last attempt being >10 yrs ago. Pt denies plan but does state that she has had more increasing SI since coming off her lithium 1 yr ago. Pt states that she was taken off d/t blood levels being highly toxic.

## 2020-07-01 NOTE — Progress Notes (Signed)
Called and spoke with [facility] provider. Patient declined for [facility] as well as [facility] due to age. Report called to [facility].

## 2020-07-01 NOTE — H&P (Signed)
Behavioral Health Medical Screening Exam  [name] is an 70 y.o. female with history of bipolar disorder presenting for anxiety, mood instability with insomnia, low appetite, and suicidal thoughts to overdose on her medication. She is tearful and anxious on assessment. She reports previously taking lithium but had to be discontinued due to lithium toxicity in 2020. Denies prior trials of Seroquel or Zyprexa.  Total Time spent with patient: 15 minutes  Psychiatric Specialty Exam: Physical Exam Vitals reviewed.  Constitutional:      Appearance: She is well-developed and well-nourished.  Cardiovascular:     Rate and Rhythm: Tachycardia present.  Pulmonary:     Effort: Pulmonary effort is normal.  Musculoskeletal:        General: Normal range of motion.  Skin:    General: Skin is warm and dry.  Neurological:     Mental Status: She is alert and oriented to person, place, and time.    Review of Systems  Constitutional: Negative.   Respiratory: Negative for cough and shortness of breath.   Cardiovascular: Negative for chest pain.  Gastrointestinal: Negative for diarrhea, nausea and vomiting.  Musculoskeletal: Negative for arthralgias.  Psychiatric/Behavioral: Positive for agitation, decreased concentration, dysphoric mood, sleep disturbance and suicidal ideas. Negative for behavioral problems, confusion, hallucinations and self-injury. The patient is nervous/anxious. The patient is not hyperactive.    Blood pressure (!) 155/110, pulse (!) 115, temperature 98.3 F (36.8 C), temperature source Oral, resp. rate 20, SpO2 99 %.There is no height or weight on file to calculate BMI. General Appearance: Casual Eye Contact:  Fair Speech:  Normal Rate Volume:  Normal Mood:  Anxious Affect:  Congruent Thought Process:  Coherent Orientation:  Full (Time, Place, and Person) Thought Content:  Logical Suicidal Thoughts:  Yes.  with intent/plan Homicidal Thoughts:  No Memory:   Immediate;   Fair Recent;   Fair Remote;   Fair Judgement:  Impaired Insight:  Fair Psychomotor Activity:  Normal Concentration: Concentration: Fair and Attention Span: Fair Recall:  [employer] of Knowledge:Fair Language: Fair Akathisia:  No Handed:  Right AIMS (if indicated):    Assets:  Communication Skills Desire for Improvement Housing Leisure Time Social Support Sleep:     Musculoskeletal: Strength & Muscle Tone: within normal limits Gait & Station: normal Patient leans: N/A  Blood pressure (!) 155/110, pulse (!) 115, temperature 98.3 F (36.8 C), temperature source Oral, resp. rate 20, SpO2 99 %.  Recommendations: Based on my evaluation the patient does not appear to have an emergency medical condition.  Inpatient hospitalization.  Addendum: Per AC, no appropriate beds at [facility]. Have attempted to call [facility] with no response. Will continue to attempt to contact [facility].   Aldean Baker, NP 07/01/2020, 7:42 PM

## 2020-07-01 NOTE — BH Assessment (Signed)
Comprehensive Clinical Assessment (CCA) Note  07/01/2020 [name] [id number]   Visit Diagnosis: Bipolar Disorder, mixed, severe without psychosis Disposition: Brandi Sine, NP recommends inpt psychiatric tx  [name] is a 70 yo married female who presents voluntarily to [facility] for a walk-in assessment. Pt was accompanied by her spouse, [name] [phone]. Pt is reporting symptoms of anxiety, racing thoughts, depression and suicidal ideation. Pt has a history of Bipolar Disorder. Her last inpt psychiatric admission was at [facility] 15 years ago following a suicide attempt.  She reports medication compliance. Pt reports current suicidal ideation with plans of overdosing. Past attempts include 4 times- all overdoses.   Pt acknowledges multiple symptoms of Depression, including anhedonia, isolating, feelings of shame & guilt, tearfulness, decreased sleep & appetite (15 lbs in 2 weeks), & increased irritability. Pt denies homicidal ideation/ history of violence. Pt denies auditory & visual hallucinations & other symptoms of psychosis. Pt states current stressors include some situational stressors, but primary concerns are lack of sleep (less than 4 hours a night for the past 2 weeks), racing thoughts, wt loss and anxiety.   Pt lives with her husband, and supports include husband, daughter & sons. Pt denies a hx of abuse. Pt has  Partial insight and judgment. Pt's memory is intact.  Protective factors against suicide include good family support, therapeutic relationship, no access to firearms (spouse has guns but pt has no access), no current psychotic symptoms and strong religious faith.?  Pt's OP history includes individual counseling with [facility].  Pt denies alcohol/ substance abuse. ? MSE: Pt is casually dressed, tearful and agitated, oriented x 5 with normal speech and restless motor behavior. Eye contact is good. Pt's mood is depressed, agitated, and anxious and  affect is congruent with mood. Thought process is coherent and relevant. There is no indication pt is currently responding to internal stimuli or experiencing delusional thought content. Pt was cooperative throughout assessment.   Chief Complaint:  Chief Complaint  Patient presents with  . Psychiatric Evaluation  . Suicidal     CCA Screening, Triage and Referral (STR)  Patient Reported Information How did you hear about [place]? Family/Friend  Referral name: husband   Whom do you see for routine medical problems? Primary Care   What Is the Reason for Your Visit/Call Today? depession sx, racing thoughts, anxiety and sleeplessness  How Long Has This Been Causing You Problems? 1 wk - 1 month  What Do You Feel Would Help You the Most Today? Medication (inpt tx if recommended)   Have You Recently Been in Any Inpatient Treatment (Hospital/Detox/Crisis Center/28-Day Program)? No   Have You Ever Received Services From [facility] Before? Yes   Have You Recently Had Any Thoughts About Hurting Yourself? Yes  Are You Planning to Commit Suicide/Harm Yourself At This time? Yes   Have you Recently Had Thoughts About Hurting Someone [name]? No   Have You Used Any Alcohol or Drugs in the Past 24 Hours? No   Do You Currently Have a Therapist/Psychiatrist? Yes  Name of Therapist/Psychiatrist: Chapman Moss at [facility] Recently Discharged From Any Office Practice or Programs? No    CCA Screening Triage Referral Assessment Type of Contact: Face-to-Face   Patient Reported Information Reviewed? Yes  Collateral Involvement: spouse, [name] [phone]  Is CPS involved or ever been involved? Never  Is APS involved or ever been involved? Never   Patient Determined To Be At Risk for Harm To  Self or Others Based on Review of Patient Reported Information or Presenting Complaint? Yes, for Self-Harm   Location of Assessment: -- [facility]   Does Patient Present under Involuntary Commitment? No   [place] of Residence: [place]   Patient Currently Receiving the Following Services: Individual Therapy   Determination of Need: Emergent (2 hours)   Options For Referral: Inpatient Hospitalization; Medication Management; Partial Hospitalization    CCA Biopsychosocial Intake/Chief Complaint:  depession sx, racing thoughts, anxiety and sleeplessness  Current Symptoms/Problems: depession sx, racing thoughts, anxiety and sleeplessness   Patient Reported Schizophrenia/Schizoaffective Diagnosis in Past: No   Strengths: social support network, her faith   Type of Services Patient Feels are Needed: unsure; needs medication to help with sleep and anxiety   Initial Clinical Notes/Concerns: NO HALDOL- allergic   Mental Health Symptoms Depression:  Change in energy/activity; Difficulty Concentrating; Fatigue; Hopelessness; Increase/decrease in appetite; Irritability; Sleep (too much or little); Tearfulness; Weight gain/loss   Duration of Depressive symptoms: Greater than two weeks   Mania:  Racing thoughts; Recklessness; Irritability; Change in energy/activity   Anxiety:   Difficulty concentrating; Fatigue; Irritability; Restlessness; Sleep; Tension; Worrying   Psychosis:  None   Duration of Psychotic symptoms: No data recorded  Trauma:  N/A   Obsessions:  N/A   Compulsions:  N/A   Inattention:  N/A   Hyperactivity/Impulsivity:  N/A   Oppositional/Defiant Behaviors:  N/A   Emotional Irregularity:  N/A   Other Mood/Personality Symptoms:  No data recorded   Mental Status Exam Appearance and self-care  Stature:  Average   Weight:  Average weight   Clothing:  No data recorded  Grooming:  Normal   Cosmetic use:  None   Posture/gait:  Tense   Motor activity:  Restless   Sensorium  Attention:  Vigilant   Concentration:  Anxiety interferes   Orientation:  X5   Recall/memory:  Normal    Affect and Mood  Affect:  Anxious; Labile; Tearful; Depressed   Mood:  Anxious; Depressed; Dysphoric   Relating  Eye contact:  Normal   Facial expression:  Tense; Anxious; Constricted   Attitude toward examiner:  Cooperative   Thought and Language  Speech flow: Clear and Coherent   Thought content:  Appropriate to Mood and Circumstances   Preoccupation:  None   Hallucinations:  None   Organization:  No data recorded  [employer] of Knowledge:  Good   Intelligence:  Above Average   Abstraction:  No data recorded  Judgement:  Impaired   Reality Testing:  Variable   Insight:  Gaps   Decision Making:  Vacilates   Social Functioning  Social Maturity:  Isolates; Responsible   Social Judgement:  Normal   Stress  Stressors:  Grief/losses   Coping Ability:  Exhausted; Overwhelmed   Skill Deficits:  None   Supports:  Church; Family     Religion: Religion/Spirituality Are You A Religious Person?: Yes What is Your Religious Affiliation?: [place]   Exercise/Diet: Exercise/Diet Have You Gained or Lost A Significant Amount of Weight in the Past Six Months?: Yes-Lost Number of Pounds Lost?: 15 (15 lbs in 2 weeks) Do You Have Any Trouble Sleeping?: Yes Explanation of Sleeping Difficulties: less than 4 hours q hs x 2 weeks   CCA Employment/Education Employment/Work Situation:    CCA Family/Childhood History Family and Relationship History: Family history Marital status: Married What is your sexual orientation?: hetero Does patient have children?: Yes How many children?: 3 How  is patient's relationship with their children?: good  Childhood History:  Childhood History Does patient have siblings?: Yes Number of Siblings: 2 Description of patient's current relationship with siblings: good but they live in [place] Did patient suffer any verbal/emotional/physical/sexual abuse as a child?: No Did patient suffer from severe  childhood neglect?: No Has patient ever been sexually abused/assaulted/raped as an adolescent or adult?: No Was the patient ever a victim of a crime or a disaster?: No   CCA Substance Use Alcohol/Drug Use: Alcohol / Drug Use Pain Medications: See MAR Prescriptions: See MAR Pantopraxole; duloxetine, atorvastitin 10mg , Losartan 100mg , calcium one a day History of alcohol / drug use?: No history of alcohol / drug abuse     DSM5 Diagnoses: Patient Active Problem List   Diagnosis Date Noted  . Bipolar 1 disorder, depressed, severe (HCC) 07/01/2020  . Prediabetes 01/24/2019  . Normocytic anemia 01/24/2019  . Hypernatremia 01/24/2019  . Hyperchloremia 01/24/2019  . Renal insufficiency 01/24/2019  . Obesity (BMI 30-39.9) 01/24/2019  . HTN (hypertension) 01/20/2019  . HLD (hyperlipidemia) 01/20/2019  . Hx of adenomatous colonic polyps 05/14/2015  . Prolapsed internal hemorrhoids, grade 3 05/09/2015   Disposition: 05/16/2015, NP recommends inpt psychiatric tx  Nino Amano 13/04/2015, LCSW

## 2020-07-02 LAB — RAPID URINE DRUG SCREEN, HOSP PERFORMED
Amphetamines: NOT DETECTED
Barbiturates: NOT DETECTED
Benzodiazepines: NOT DETECTED
Cocaine: NOT DETECTED
Opiates: NOT DETECTED
Tetrahydrocannabinol: NOT DETECTED

## 2020-07-02 LAB — RESP PANEL BY RT-PCR (FLU A&B, COVID) ARPGX2
Influenza A by PCR: NEGATIVE
Influenza B by PCR: NEGATIVE
SARS Coronavirus 2 by RT PCR: NEGATIVE

## 2020-07-02 MED ORDER — LORAZEPAM 1 MG PO TABS
1.0000 mg | ORAL_TABLET | Freq: Once | ORAL | Status: AC
  Administered 2020-07-02: 1 mg via ORAL
  Filled 2020-07-02: qty 1

## 2020-07-02 NOTE — ED Notes (Signed)
Pt transported to [facility] via [facility].

## 2020-07-02 NOTE — Progress Notes (Signed)
Per Marciano Sequin NP this pt meets criteria for geropsychiatry inpatient treatment.   Pt was referred to the follow facilities for review:    [facility]    Disposition CSW will continue to assist in seeking bed placement.     Jacinta Shoe, LCSW Clinical Social Worker - Disposition  [phone]

## 2020-07-02 NOTE — ED Notes (Signed)
[facility] called for transport.  ?

## 2020-07-02 NOTE — ED Notes (Signed)
Called report to facility, [facility]

## 2020-07-02 NOTE — Progress Notes (Signed)
Pt accepted to [facility].  Dr. Joycelyn Rua is the accepting provider.    Call report to [phone].  Colin Mulders RN @ [facility] ED notified.      Pt is Voluntary.    Pt may be transported by [employer] [facility].  Pt scheduled  to arrive at [facility] once transportation is arranged.      Jacinta Shoe, LCSW Clinical Social Worker - Disposition  [phone]

## 2020-10-07 ENCOUNTER — Encounter (HOSPITAL_COMMUNITY): Payer: Self-pay

## 2020-10-07 ENCOUNTER — Other Ambulatory Visit: Payer: Self-pay

## 2020-10-07 ENCOUNTER — Emergency Department (HOSPITAL_COMMUNITY)
Admission: EM | Admit: 2020-10-07 | Discharge: 2020-10-08 | Disposition: A | Discharge status: Home / Self Care | Payer: Medicare Other | Attending: Emergency Medicine | Admitting: Emergency Medicine

## 2020-10-07 DIAGNOSIS — Z79899 Other long term (current) drug therapy: Secondary | ICD-10-CM | POA: Diagnosis not present

## 2020-10-07 DIAGNOSIS — I10 Essential (primary) hypertension: Secondary | ICD-10-CM | POA: Insufficient documentation

## 2020-10-07 DIAGNOSIS — A599 Trichomoniasis, unspecified: Secondary | ICD-10-CM | POA: Insufficient documentation

## 2020-10-07 DIAGNOSIS — Z008 Encounter for other general examination: Secondary | ICD-10-CM

## 2020-10-07 DIAGNOSIS — R4689 Other symptoms and signs involving appearance and behavior: Secondary | ICD-10-CM

## 2020-10-07 DIAGNOSIS — F314 Bipolar disorder, current episode depressed, severe, without psychotic features: Secondary | ICD-10-CM | POA: Insufficient documentation

## 2020-10-07 LAB — URINALYSIS, ROUTINE W REFLEX MICROSCOPIC
Bilirubin Urine: NEGATIVE
Glucose, UA: NEGATIVE mg/dL
Hgb urine dipstick: NEGATIVE
Ketones, ur: NEGATIVE mg/dL
Nitrite: NEGATIVE
Protein, ur: NEGATIVE mg/dL
Specific Gravity, Urine: 1.013 (ref 1.005–1.030)
WBC, UA: >50 WBC/hpf — ABNORMAL HIGH (ref 0–5)
pH: 6 (ref 5.0–8.0)

## 2020-10-07 LAB — SALICYLATE LEVEL: Salicylate Lvl: <7 mg/dL — ABNORMAL LOW (ref 7.0–30.0)

## 2020-10-07 LAB — COMPREHENSIVE METABOLIC PANEL
ALT: 12 U/L (ref 0–44)
AST: 15 U/L (ref 15–41)
Albumin: 4.2 g/dL (ref 3.5–5.0)
Alkaline Phosphatase: 56 U/L (ref 38–126)
Anion gap: 9 (ref 5–15)
BUN: 10 mg/dL (ref 8–23)
CO2: 21 mmol/L — ABNORMAL LOW (ref 22–32)
Calcium: 10.8 mg/dL — ABNORMAL HIGH (ref 8.9–10.3)
Chloride: 105 mmol/L (ref 98–111)
Creatinine, Ser: 1.43 mg/dL — ABNORMAL HIGH (ref 0.44–1.00)
GFR, Estimated: 39 mL/min — ABNORMAL LOW (ref >60)
Glucose, Bld: 105 mg/dL — ABNORMAL HIGH (ref 70–99)
Potassium: 4.1 mmol/L (ref 3.5–5.1)
Sodium: 135 mmol/L (ref 135–145)
Total Bilirubin: 1.2 mg/dL (ref 0.3–1.2)
Total Protein: 6.5 g/dL (ref 6.5–8.1)

## 2020-10-07 LAB — CBC
HCT: 36 % (ref 36.0–46.0)
Hemoglobin: 11 g/dL — ABNORMAL LOW (ref 12.0–15.0)
MCH: 27.4 pg (ref 26.0–34.0)
MCHC: 30.6 g/dL (ref 30.0–36.0)
MCV: 89.8 fL (ref 80.0–100.0)
Platelets: 246 10*3/uL (ref 150–400)
RBC: 4.01 MIL/uL (ref 3.87–5.11)
RDW: 17.1 % — ABNORMAL HIGH (ref 11.5–15.5)
WBC: 4.5 10*3/uL (ref 4.0–10.5)
nRBC: 0 % (ref 0.0–0.2)

## 2020-10-07 LAB — RAPID URINE DRUG SCREEN, HOSP PERFORMED
Amphetamines: NOT DETECTED
Barbiturates: NOT DETECTED
Benzodiazepines: NOT DETECTED
Cocaine: NOT DETECTED
Opiates: NOT DETECTED
Tetrahydrocannabinol: NOT DETECTED

## 2020-10-07 LAB — ACETAMINOPHEN LEVEL: Acetaminophen (Tylenol), Serum: <10 ug/mL — ABNORMAL LOW (ref 10–30)

## 2020-10-07 LAB — RPR: RPR Ser Ql: NONREACTIVE

## 2020-10-07 LAB — ETHANOL: Alcohol, Ethyl (B): <10 mg/dL (ref <10)

## 2020-10-07 MED ORDER — VITAMIN B-12 1000 MCG PO TABS
1000.0000 ug | ORAL_TABLET | Freq: Every day | ORAL | Status: DC
  Administered 2020-10-07 – 2020-10-08 (×2): 1000 ug via ORAL
  Filled 2020-10-07 (×2): qty 1

## 2020-10-07 MED ORDER — METRONIDAZOLE 500 MG PO TABS
500.0000 mg | ORAL_TABLET | Freq: Two times a day (BID) | ORAL | Status: DC
  Administered 2020-10-07 – 2020-10-08 (×3): 500 mg via ORAL
  Filled 2020-10-07 (×2): qty 1

## 2020-10-07 MED ORDER — PANTOPRAZOLE SODIUM 40 MG PO TBEC
40.0000 mg | DELAYED_RELEASE_TABLET | Freq: Every day | ORAL | Status: DC
  Administered 2020-10-07 – 2020-10-08 (×2): 40 mg via ORAL
  Filled 2020-10-07 (×2): qty 1

## 2020-10-07 MED ORDER — DULOXETINE HCL 60 MG PO CPEP
60.0000 mg | ORAL_CAPSULE | Freq: Every day | ORAL | Status: DC
  Administered 2020-10-07 – 2020-10-08 (×2): 60 mg via ORAL
  Filled 2020-10-07 (×2): qty 1

## 2020-10-07 MED ORDER — ATORVASTATIN CALCIUM 10 MG PO TABS
10.0000 mg | ORAL_TABLET | Freq: Every day | ORAL | Status: DC
  Administered 2020-10-07 – 2020-10-08 (×2): 10 mg via ORAL
  Filled 2020-10-07 (×2): qty 1

## 2020-10-07 MED ORDER — LOSARTAN POTASSIUM 50 MG PO TABS
100.0000 mg | ORAL_TABLET | Freq: Every day | ORAL | Status: DC
  Administered 2020-10-07 – 2020-10-08 (×2): 100 mg via ORAL
  Filled 2020-10-07 (×2): qty 2

## 2020-10-07 NOTE — ED Notes (Signed)
Secretary to order pt breakfast

## 2020-10-07 NOTE — BH Assessment (Signed)
Comprehensive Clinical Assessment (CCA) Note  10/07/2020 [name] [id number]  Chief Complaint:  Chief Complaint  Patient presents with  . Mental Health Problem   Visit Diagnosis:  Bipolar 1 disorder, severe, depressed  Disposition: Per Brandi Romp, NP overnight observation with provider reassessment in the AM. Pt RN notified via phone and [place].   [facility] from 10/07/2020 in [place] ED from 07/01/2020 in [place] DEPT  C-SSRS RISK CATEGORY No Risk Low Risk     The patient demonstrates the following risk factors for suicide: Chronic risk factors for suicide include: psychiatric disorder of bipolar disorder and previous suicide attempts x 4 (recent hospitalization). Acute risk factors for suicide include: social withdrawal/isolation and recent discharge from inpatient psychiatry. Protective factors for this patient include: positive social support, positive therapeutic relationship, responsibility to others (children, family), coping skills and religious beliefs against suicide. Considering these factors, the overall suicide risk at this point appears to be moderate. Patient is not appropriate for outpatient follow up.  [name] is a 70 yo female reporting to [facility] for evaluation of bizarre behaviors and suicidal ideation. Pt is currently being treated by [facility]. Pt recently had inpatient treatment at [facility] for suicidal ideation/plans (January2022). Pt was inpatient for 2+ weels. Pts husband reports that pt had a stroke and covid while she was hospitalized at [place] husband states that 1 week ago pt started behaving out of character--pt was undressing and walking around nude then would get dressed and put on multiple articles of clothing. Pts husband called the counseling center, and they ordered a lab test to check for UTI which husband states was positive. Pt got RX  for an antibiotic but the behaviors continued, which triggered husband to bring [name] to the hospital. [name] is able to answer some questions but has disorganized thought process. Pt states that she has suicidal ideation and is a danger to self--pts husband denies this.  [name], MSW, LCSW Outpatient Therapist/Triage Specialist    CCA Screening, Triage and Referral (STR)  Patient Reported Information How did you hear about [place]? Family/Friend  Referral name: husband  Referral phone number: No data recorded  Whom do you see for routine medical problems? Primary Care  Practice/Facility Name: No data recorded Practice/Facility Phone Number: No data recorded Name of Contact: No data recorded Contact Number: No data recorded Contact Fax Number: No data recorded Prescriber Name: No data recorded Prescriber Address (if known): No data recorded  What Is the Reason for Your Visit/Call Today? depession sx, racing thoughts, anxiety and sleeplessness  How Long Has This Been Causing You Problems? 1 wk - 1 month  What Do You Feel Would Help You the Most Today? Treatment for Depression or other mood problem   Have You Recently Been in Any Inpatient Treatment (Hospital/Detox/Crisis Center/28-Day Program)? Yes  Name/Location of Program/Hospital:[name]  How Long Were You There? 2+ weeks  When Were You Discharged? No data recorded  Have You Ever Received Services From [facility] Before? Yes  Who Do You See at [facility]? No data recorded  Have You Recently Had Any Thoughts About Hurting Yourself? Yes  Are You Planning to Commit Suicide/Harm Yourself At This time? No   Have you Recently Had Thoughts About [place]? No  Explanation: No data recorded  Have You Used Any Alcohol or Drugs in the Past 24 Hours? No  How Long Ago Did You Use Drugs  or Alcohol? No data recorded What Did You Use and How Much? No data recorded  Do You Currently Have a  Therapist/Psychiatrist? Yes  Name of Therapist/Psychiatrist: Chapman [name] at [place] Recently Discharged From Any Office Practice or Programs? No  Explanation of Discharge From Practice/Program: No data recorded    CCA Screening Triage Referral Assessment Type of Contact: Tele-Assessment  Is this Initial or Reassessment? Initial Assessment  Date Telepsych consult ordered in [facility]:  10/07/2020  Time Telepsych consult ordered in [facility]:  [place]   Patient Reported Information Reviewed? Yes  Patient Left Without Being Seen? No data recorded Reason for Not Completing Assessment: No data recorded  Collateral Involvement: spouse, [name] [phone]   Does Patient Have a [place]? No data recorded Name and Contact of Legal Guardian: No data recorded If [name] and Not Living with Parent(s), Who has Custody? No data recorded Is CPS involved or ever been involved? Never  Is APS involved or ever been involved? Never   Patient Determined To Be At Risk for Harm To Self or Others Based on Review of Patient Reported Information or Presenting Complaint? Yes, for Self-Harm  Method: No data recorded Availability of Means: No data recorded Intent: No data recorded Notification Required: No data recorded Additional Information for Danger to Others Potential: No data recorded Additional Comments for Danger to Others Potential: No data recorded Are There Guns or Other Weapons in Your Home? No data recorded Types of Guns/Weapons: No data recorded Are These Weapons Safely Secured?                            No data recorded Who Could Verify You Are Able To Have These Secured: No data recorded Do You Have any Outstanding Charges, Pending Court Dates, Parole/Probation? No data recorded Contacted To Inform of Risk of Harm To Self or Others: Family/Significant Other:   Location of Assessment: [facility] ED   Does Patient Present under  Involuntary Commitment? No  IVC Papers Initial File Date: No data recorded  [place] of Residence: [place]   Patient Currently Receiving the Following Services: Medication Management; Individual Therapy   Determination of Need: Emergent (2 hours)   Options For Referral: Other: Comment (overnight observation with provider reassessment in the AM)     CCA Biopsychosocial Intake/Chief Complaint:  [name] is a 70 yo female reporting to [facility] for evaluation of bizarre behaviors and suicidal ideation. Pt is currently being treated by [facility].  Pt recently had inpatient treatment at [facility] for suicidal ideation/plans (January2022).  Pt was inpatient for 2+ weels. Pts husband reports that pt had a stroke and covid while she was hospitalized at [facility] husband states that 1 week ago pt started behaving out of character--pt was undressing and walking around nude then would get dressed and put on multiple articles of clothing. Pts husband called the counseling center, and they ordered a lab test to check for UTI which husband states was positive. Pt got RX for an antibiotic but the behaviors continued, which triggered husband to bring [name] to the hospital. [name] is able to answer some questions but has disorganized thought process. Pt states that she has suicidal ideation and is a danger to self--pts husband denies this.  Current Symptoms/Problems: bizarre behaviors   Patient Reported Schizophrenia/Schizoaffective Diagnosis in Past: No   Strengths: social support network, her faith  Preferences: No data recorded Abilities: No  data recorded  Type of Services Patient Feels are Needed: psychiatric stability   Initial Clinical Notes/Concerns: NO HALDOL- allergic   Mental Health Symptoms Depression:  Change in energy/activity; Difficulty Concentrating; Fatigue; Hopelessness; Increase/decrease in appetite; Irritability; Sleep (too much or little); Tearfulness;  Weight gain/loss   Duration of Depressive symptoms: Greater than two weeks   Mania:  Racing thoughts; Recklessness; Irritability; Change in energy/activity   Anxiety:   Difficulty concentrating; Fatigue; Irritability; Restlessness; Sleep; Tension; Worrying   Psychosis:  Grossly disorganized or catatonic behavior; Affective flattening/alogia/avolition   Duration of Psychotic symptoms: Less than six months   Trauma:  N/A   Obsessions:  N/A   Compulsions:  N/A   Inattention:  N/A   Hyperactivity/Impulsivity:  N/A   Oppositional/Defiant Behaviors:  N/A   Emotional Irregularity:  N/A   Other Mood/Personality Symptoms:  No data recorded   Mental Status Exam Appearance and self-care  Stature:  Small   Weight:  Average weight   Clothing:  No data recorded  Grooming:  Normal   Cosmetic use:  None   Posture/gait:  Tense   Motor activity:  Restless   Sensorium  Attention:  Vigilant   Concentration:  Anxiety interferes   Orientation:  X5   Recall/memory:  Normal   Affect and Mood  Affect:  Anxious; Labile; Tearful; Depressed   Mood:  Anxious; Depressed; Dysphoric   Relating  Eye contact:  Normal   Facial expression:  Tense; Anxious; Constricted   Attitude toward examiner:  Cooperative   Thought and Language  Speech flow: Clear and Coherent   Thought content:  Appropriate to Mood and Circumstances   Preoccupation:  Ruminations (pts husband states that pt is preoccupied with numbers--ss numbers and phone numbers)   Hallucinations:  None   Organization:  No data recorded  [employer] of Knowledge:  Good   Intelligence:  Above Average   Abstraction:  No data recorded  Judgement:  Impaired   Reality Testing:  Variable   Insight:  Gaps   Decision Making:  Vacilates   Social Functioning  Social Maturity:  Isolates; Responsible   Social Judgement:  Normal   Stress  Stressors:  Grief/losses   Coping Ability:  Exhausted;  Overwhelmed   Skill Deficits:  None   Supports:  Church; Family     Religion: Religion/Spirituality Are You A Religious Person?: Yes  Leisure/Recreation: Leisure / Recreation Do You Have Hobbies?: Yes Leisure and Hobbies: reading  Exercise/Diet: Exercise/Diet Do You Exercise?: Yes What Type of Exercise Do You Do?: Run/Walk How Many Times a Week Do You Exercise?: 1-3 times a week Have You Gained or Lost A Significant Amount of Weight in the Past Six Months?: Yes-Lost Do You Have Any Trouble Sleeping?: Yes Explanation of Sleeping Difficulties: less than 4 hours q hs x 2 weeks   CCA Employment/Education Employment/Work Situation: Employment / Work Situation Employment situation: Retired  Scientist, physiological: Education Did Teacher, adult education From [employer]?: Yes Did Physicist, medical?: Yes Did Heritage manager?: Yes What Was Your Major?: Social Work Did You Have Any Chief Technology Officer In School?: School Social Work   CCA Family/Childhood History Family and Relationship History: Family history Marital status: Married Number of Years Married: 49 What is your sexual orientation?: hetero Does patient have children?: Yes How many children?: 3 How is patient's relationship with their children?: good  Childhood History:  Childhood History Additional childhood history information: stable childhood Does patient have siblings?: Yes Description of patient's current relationship  with siblings: good but they live in [place] Did patient suffer any verbal/emotional/physical/sexual abuse as a child?: No Did patient suffer from severe childhood neglect?: No Has patient ever been sexually abused/assaulted/raped as an adolescent or adult?: No Was the patient ever a victim of a crime or a disaster?: No Witnessed domestic violence?: No  Child/Adolescent Assessment:     CCA Substance Use Alcohol/Drug Use: Alcohol / Drug Use Pain Medications: See MAR Prescriptions: See MAR  Pantopraxole; duloxetine, atorvastitin 10mg , Losartan 100mg , calcium one a day History of alcohol / drug use?: No history of alcohol / drug abuse    ASAM's:  Six Dimensions of Multidimensional Assessment  Dimension 1:  Acute Intoxication and/or Withdrawal Potential:      Dimension 2:  Biomedical Conditions and Complications:      Dimension 3:  Emotional, Behavioral, or Cognitive Conditions and Complications:     Dimension 4:  Readiness to Change:     Dimension 5:  Relapse, Continued use, or Continued Problem Potential:     Dimension 6:  Recovery/Living Environment:     ASAM Severity Score:    ASAM Recommended Level of Treatment:     Substance use Disorder (SUD)    Recommendations for Services/Supports/Treatments: Recommendations for Services/Supports/Treatments Recommendations For Services/Supports/Treatments: Other (Comment) (Overnight observation with provider reassessment in the AM)  DSM5 Diagnoses: Patient Active Problem List   Diagnosis Date Noted  . Bipolar 1 disorder, depressed, severe ([place]) 07/01/2020  . Prediabetes 01/24/2019  . Normocytic anemia 01/24/2019  . Hypernatremia 01/24/2019  . Hyperchloremia 01/24/2019  . Renal insufficiency 01/24/2019  . Obesity (BMI 30-39.9) 01/24/2019  . HTN (hypertension) 01/20/2019  . HLD (hyperlipidemia) 01/20/2019  . Hx of adenomatous colonic polyps 05/14/2015  . Prolapsed internal hemorrhoids, grade 3 05/09/2015    Referrals to Alternative Service(s): Referred to Alternative Service(s):   Place:   Date:   Time:    Referred to Alternative Service(s):   Place:   Date:   Time:    Referred to Alternative Service(s):   Place:   Date:   Time:    Referred to Alternative Service(s):   Place:   Date:   Time:     Rachel Bo Joseguadalupe Stan, LCSW

## 2020-10-07 NOTE — ED Provider Notes (Signed)
[facility] Emergency Department Provider Note MRN:  [id number]  Arrival date & time: 10/07/20     Chief Complaint   Mental Health Problem   History of Present Illness   [name] is a 70 y.o. year-old female with a history of depression, stroke presenting to the ED with chief complaint of mental health problem.  Worsening mental health and erratic behavior over the past few weeks.  Constantly undressing at home, having delusions, not eating or drinking or caring for self.  I was unable to obtain an accurate HPI, PMH, or ROS due to the patient's aphasia.  Level 5 caveat.  Review of Systems  Positive for behavioral change.  Patient's Health History    Past Medical History:  Diagnosis Date  . Anxiety   . Depression   . Hx of adenomatous colonic polyps 05/14/2015  . Hypertension   . Papilloma of breast    right  . Prolapsed internal hemorrhoids, grade 3 05/09/2015  . SBO (small bowel obstruction) ([place]) 01/19/2019  . Vitamin D deficiency     Past Surgical History:  Procedure Laterality Date  . ABDOMINAL HYSTERECTOMY    . ANKLE FRACTURE SURGERY    . BREAST LUMPECTOMY WITH RADIOACTIVE SEED LOCALIZATION Right 05/06/2015   Procedure: RIGHT BREAST LUMPECTOMY WITH RADIOACTIVE SEED LOCALIZATION;  Surgeon: Coralie Keens, MD;  Location: [place];  Service: General;  Laterality: Right;  . COLONOSCOPY    . ESOPHAGOGASTRODUODENOSCOPY    . FINGER FRACTURE SURGERY    . HEMORRHOID BANDING    . LAPAROSCOPY N/A 01/22/2019   Procedure: LAPAROSCOPY DIAGNOSTIC;  Surgeon: Greer Pickerel, MD;  Location: [place];  Service: General;  Laterality: N/A;  . LAPAROTOMY N/A 01/22/2019   Procedure: EXPLORATORY LAPAROTOMY;  Surgeon: Greer Pickerel, MD;  Location: [place];  Service: General;  Laterality: N/A;  . LYSIS OF ADHESION  01/22/2019   Procedure: Lysis Of  Adhesions x 1 hour;  Surgeon: Greer Pickerel, MD;  Location: [place];  Service: General;;  . OVARIAN CYST  REMOVAL    . TONSILLECTOMY AND ADENOIDECTOMY      Family History  Problem Relation Age of Onset  . Breast cancer Mother   . Colon cancer Neg Hx     Social History   Socioeconomic History  . Marital status: Married    Spouse name: Not on file  . Number of children: Not on file  . Years of education: Not on file  . Highest education level: Not on file  Occupational History  . Not on file  Tobacco Use  . Smoking status: Never Smoker  . Smokeless tobacco: Never Used  Substance and Sexual Activity  . Alcohol use: No    Alcohol/week: 0.0 standard drinks  . Drug use: No  . Sexual activity: Not on file  Other Topics Concern  . Not on file  Social History Narrative  . Not on file   Social Determinants of Health   Financial Resource Strain: Not on file  Food Insecurity: Not on file  Transportation Needs: Not on file  Physical Activity: Not on file  Stress: Not on file  Social Connections: Not on file  Intimate Partner Violence: Not on file     Physical Exam   Vitals:   10/07/20 0416 10/07/20 0549  BP: 113/79 133/81  Pulse: (!) 110 (!) 102  Resp: (!) 22 17  Temp: 98.2 F (36.8 C)   SpO2: 96% 100%    CONSTITUTIONAL: Well-appearing, NAD NEURO: Awake, alert,  intermittently tearful, moves all extremities EYES:  eyes equal and reactive ENT/NECK:  no LAD, no JVD CARDIO: Regular rate, well-perfused, normal S1 and S2 PULM:  CTAB no wheezing or rhonchi GI/GU:  normal bowel sounds, non-distended, non-tender MSK/SPINE:  No gross deformities, no edema SKIN:  no rash, atraumatic PSYCH: Agitated speech and behavior  *Additional and/or pertinent findings included in MDM below  Diagnostic and Interventional Summary    EKG Interpretation  Date/Time:    Ventricular Rate:    PR Interval:    QRS Duration:   QT Interval:    QTC Calculation:   R Axis:     Text Interpretation:        Labs Reviewed  COMPREHENSIVE METABOLIC PANEL - Abnormal; Notable for the following  components:      Result Value   CO2 21 (*)    Glucose, Bld 105 (*)    Creatinine, Ser 1.43 (*)    Calcium 10.8 (*)    GFR, Estimated 39 (*)    All other components within normal limits  SALICYLATE LEVEL - Abnormal; Notable for the following components:   Salicylate Lvl <9.1 (*)    All other components within normal limits  ACETAMINOPHEN LEVEL - Abnormal; Notable for the following components:   Acetaminophen (Tylenol), Serum <10 (*)    All other components within normal limits  CBC - Abnormal; Notable for the following components:   Hemoglobin 11.0 (*)    RDW 17.1 (*)    All other components within normal limits  URINALYSIS, ROUTINE W REFLEX MICROSCOPIC - Abnormal; Notable for the following components:   APPearance CLOUDY (*)    Leukocytes,Ua LARGE (*)    WBC, UA >50 (*)    Bacteria, UA FEW (*)    Trichomonas, UA PRESENT (*)    Non Squamous Epithelial 0-5 (*)    All other components within normal limits  ETHANOL  RAPID URINE DRUG SCREEN, HOSP PERFORMED  RPR    No orders to display    Medications  metroNIDAZOLE (FLAGYL) tablet 500 mg (has no administration in time range)     Procedures  /  Critical Care Procedures  ED Course and Medical Decision Making  I have reviewed the triage vital signs, the nursing notes, and pertinent available records from the EMR.  Listed above are laboratory and imaging tests that I personally ordered, reviewed, and interpreted and then considered in my medical decision making (see below for details).  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  History of stroke with aphasia.  No new neurological deficits.  Currently being treated for UTI, doubt urosepsis or the UTI being the cause of the worsening behavioral changes, no fever here.  Awaiting medical clearance, will consult TTS.     Urinalysis demonstrating trichomonas infection, will treat with Flagyl.  Otherwise medically cleared, awaiting TTS  evaluation.  Signed out to default provider.  Barth Kirks. Sedonia Small, [place] [email or web address]  Final Clinical Impressions(s) / ED Diagnoses     ICD-10-CM   1. Abnormal behavior  R46.89   2. Trichomonas infection  A59.9   3. Encounter for psychological evaluation  Z00.8     ED Discharge Orders    None       Discharge Instructions Discussed with and Provided to Patient:   Discharge Instructions   None       Maudie Flakes, MD 10/07/20 [phone]

## 2020-10-07 NOTE — ED Triage Notes (Addendum)
Pt BIB husband due to decline in health.Per husband pt suffers from bipolar and he feels the medication she is on is not working. Husbands denies pt has expressed any SI or HI. Husband reports she has in the past. Pt reports she was also recently diagnosed with UTI. Pt is not sitting in triage and is trying to get up.Pt will not answer any questions only husband.Husband reports pt has h/o aphasia due to stroke in January. Husband reports her s/s started 2-3 days ago.Husband reports pt is not eating or drinking. Pt is not following commands. Husband reports when she started acting like this before she started to express SI and HI.

## 2020-10-07 NOTE — ED Notes (Signed)
Pt's belongings placed in locker #6

## 2020-10-07 NOTE — ED Notes (Signed)
Secure message received from TTS. Given disposition Per Lindon Romp, NP: pt is to be observed overnight with provider reassessment in the AM.  Will notify ED provider of the same.

## 2020-10-08 ENCOUNTER — Encounter (HOSPITAL_COMMUNITY): Payer: Self-pay | Admitting: Registered Nurse

## 2020-10-08 DIAGNOSIS — A599 Trichomoniasis, unspecified: Secondary | ICD-10-CM | POA: Diagnosis present

## 2020-10-08 DIAGNOSIS — F314 Bipolar disorder, current episode depressed, severe, without psychotic features: Secondary | ICD-10-CM

## 2020-10-08 DIAGNOSIS — Z008 Encounter for other general examination: Secondary | ICD-10-CM

## 2020-10-08 DIAGNOSIS — R4689 Other symptoms and signs involving appearance and behavior: Secondary | ICD-10-CM

## 2020-10-08 MED ORDER — LAMOTRIGINE 25 MG PO TABS
75.0000 mg | ORAL_TABLET | Freq: Every morning | ORAL | Status: DC
  Administered 2020-10-08: 75 mg via ORAL
  Filled 2020-10-08 (×2): qty 3

## 2020-10-08 MED ORDER — METRONIDAZOLE 500 MG PO TABS: 500.0000 mg | ORAL_TABLET | Freq: Two times a day (BID) | ORAL | 0 refills | Status: AC

## 2020-10-08 NOTE — ED Provider Notes (Signed)
  Physical Exam  BP (!) 126/92 (BP Location: Left Arm)   Pulse (!) 122   Temp 97.8 F (36.6 C) (Oral)   Resp 16   SpO2 97%   Physical Exam  ED Course/Procedures     Procedures  MDM  Patient now been seen by psychiatry cleared for discharge.  Will treat with antibiotics for the tract infection.  Any partners will also need to be treated.  Outpatient follow-up with PCP       Davonna Belling, MD 10/08/20 1415

## 2020-10-08 NOTE — Consult Note (Signed)
Telepsych Consultation   Reason for Consult:  Altered mental status and behavioral changes Referring Physician:  Maudie Flakes, MD Location of Patient: [facility] ED Location of Provider: Other: [facility]  Patient Identification: NEZIAH VOGELGESANG MRN:  [id number] Principal Diagnosis: <principal problem not specified> Diagnosis:  Active Problems:   * No active hospital problems. *   Total Time spent with patient: 30 minutes  Subjective:   ASTI MACKLEY is a 70 y.o. female patient with complaints of altered mental status and erratic behavior worsening over last few weeks.    HPI:  BRITTLEY REGNER, 70 y.o., female patient seen via tele health by this provider, consulted with Dr. Ernie Hew; and chart reviewed on 10/08/20.  On evaluation MAHEK SCHLESINGER reports she is feeling a little better but still has some confusion.  Patient stating that her mind isn't working like it normally does.  "I told my husband that something was going on; that something was wrong with my mind making me feel crazy."  Patient states she knows she is in the hospital and that she was brought to the hospital because of her behavior.  Patient reporting yesterday she had concerns if her mind would ever be right but glad she is feeling a little better today.  Patient denies suicidal/homicidal ideation, psychosis, and paranoia.  States her main concern is acting normal and not stressing out her husband.    During evaluation FAUSTINE TATES is elevated up in bed in no acute distress.  She is alert, oriented x 4, calm and cooperative throughout assessment.  Her mood is anxious with congruent affect.  She does not appear to be responding to internal/external stimuli or delusional thoughts.  Patient denies suicidal/self-harm/homicidal ideation, psychosis, and paranoia.  Patient does endorse that she continues to have some confusion.  Patient answered question appropriately.  Past Psychiatric History: Bipolar  disorder  Risk to Self:  No Risk to Others:  No Prior Inpatient Therapy:  Yes Prior Outpatient Therapy:  Yes  Past Medical History:  Past Medical History:  Diagnosis Date  . Anxiety   . Depression   . Hx of adenomatous colonic polyps 05/14/2015  . Hypertension   . Papilloma of breast    right  . Prolapsed internal hemorrhoids, grade 3 05/09/2015  . SBO (small bowel obstruction) ([place]) 01/19/2019  . Vitamin D deficiency     Past Surgical History:  Procedure Laterality Date  . ABDOMINAL HYSTERECTOMY    . ANKLE FRACTURE SURGERY    . BREAST LUMPECTOMY WITH RADIOACTIVE SEED LOCALIZATION Right 05/06/2015   Procedure: RIGHT BREAST LUMPECTOMY WITH RADIOACTIVE SEED LOCALIZATION;  Surgeon: Coralie Keens, MD;  Location: [facility];  Service: General;  Laterality: Right;  . COLONOSCOPY    . ESOPHAGOGASTRODUODENOSCOPY    . FINGER FRACTURE SURGERY    . HEMORRHOID BANDING    . LAPAROSCOPY N/A 01/22/2019   Procedure: LAPAROSCOPY DIAGNOSTIC;  Surgeon: Greer Pickerel, MD;  Location: [facility];  Service: General;  Laterality: N/A;  . LAPAROTOMY N/A 01/22/2019   Procedure: EXPLORATORY LAPAROTOMY;  Surgeon: Greer Pickerel, MD;  Location: [facility];  Service: General;  Laterality: N/A;  . LYSIS OF ADHESION  01/22/2019   Procedure: Lysis Of  Adhesions x 1 hour;  Surgeon: Greer Pickerel, MD;  Location: [facility];  Service: General;;  . OVARIAN CYST REMOVAL    . TONSILLECTOMY AND ADENOIDECTOMY     Family History:  Family History  Problem Relation Age of Onset  . Breast cancer  Mother   . Colon cancer Neg Hx    Family Psychiatric  History: Unaware Social History:  Social History   Substance and Sexual Activity  Alcohol Use No  . Alcohol/week: 0.0 standard drinks     Social History   Substance and Sexual Activity  Drug Use No    Social History   Socioeconomic History  . Marital status: Married    Spouse name: Not on file  . Number of children: Not on file  . Years of education: Not on  file  . Highest education level: Not on file  Occupational History  . Not on file  Tobacco Use  . Smoking status: Never Smoker  . Smokeless tobacco: Never Used  Substance and Sexual Activity  . Alcohol use: No    Alcohol/week: 0.0 standard drinks  . Drug use: No  . Sexual activity: Not on file  Other Topics Concern  . Not on file  Social History Narrative  . Not on file   Social Determinants of Health   Financial Resource Strain: Not on file  Food Insecurity: Not on file  Transportation Needs: Not on file  Physical Activity: Not on file  Stress: Not on file  Social Connections: Not on file   Additional Social History:    Allergies:   Allergies  Allergen Reactions  . Nickel Rash  . Penicillins Rash    Did it involve swelling of the face/tongue/throat, SOB, or low BP? No Did it involve sudden or severe rash/hives, skin peeling, or any reaction on the inside of your mouth or nose? No Did you need to seek medical attention at a hospital or doctor's office? No When did it last happen? If all above answers are "NO", may proceed with cephalosporin use.  Nilsa Nutting Alcohol [Lanolin] Rash    Labs:  Results for orders placed or performed during the hospital encounter of 10/07/20 (from the past 48 hour(s))  Comprehensive metabolic panel     Status: Abnormal   Collection Time: 10/07/20  4:23 AM  Result Value Ref Range   Sodium 135 135 - 145 mmol/L   Potassium 4.1 3.5 - 5.1 mmol/L   Chloride 105 98 - 111 mmol/L   CO2 21 (L) 22 - 32 mmol/L   Glucose, Bld 105 (H) 70 - 99 mg/dL    Comment: Glucose reference range applies only to samples taken after fasting for at least 8 hours.   BUN 10 8 - 23 mg/dL   Creatinine, Ser 1.43 (H) 0.44 - 1.00 mg/dL   Calcium 10.8 (H) 8.9 - 10.3 mg/dL   Total Protein 6.5 6.5 - 8.1 g/dL   Albumin 4.2 3.5 - 5.0 g/dL   AST 15 15 - 41 U/L   ALT 12 0 - 44 U/L   Alkaline Phosphatase 56 38 - 126 U/L   Total Bilirubin 1.2 0.3 - 1.2 mg/dL   GFR,  Estimated 39 (L) >60 mL/min    Comment: (NOTE) Calculated using the CKD-EPI Creatinine Equation (2021)    Anion gap 9 5 - 15    Comment: Performed at [facility] [address] [place], [place] [address]  Ethanol     Status: None   Collection Time: 10/07/20  4:23 AM  Result Value Ref Range   Alcohol, Ethyl (B) <10 <10 mg/dL    Comment: (NOTE) Lowest detectable limit for serum alcohol is 10 mg/dL.  For medical purposes only. Performed at [facility], [place] [address], [place], [place]  [address]   Salicylate level     Status: Abnormal   Collection Time: 10/07/20  4:23 AM  Result Value Ref Range   Salicylate Lvl <3.4 (L) 7.0 - 30.0 mg/dL    Comment: Performed at [facility] [address] [place], [place] [address]  Acetaminophen level     Status: Abnormal   Collection Time: 10/07/20  4:23 AM  Result Value Ref Range   Acetaminophen (Tylenol), Serum <10 (L) 10 - 30 ug/mL    Comment: (NOTE) Therapeutic concentrations vary significantly. A range of 10-30 ug/mL  may be an effective concentration for many patients. However, some  are best treated at concentrations outside of this range. Acetaminophen concentrations >150 ug/mL at 4 hours after ingestion  and >50 ug/mL at 12 hours after ingestion are often associated with  toxic reactions.  Performed at [facility], [place] [address] [place], [place] [address]   cbc     Status: Abnormal   Collection Time: 10/07/20  4:23 AM  Result Value Ref Range   WBC 4.5 4.0 - 10.5 K/uL   RBC 4.01 3.87 - 5.11 MIL/uL   Hemoglobin 11.0 (L) 12.0 - 15.0 g/dL   HCT 36.0 36.0 - 46.0 %   MCV 89.8 80.0 - 100.0 fL   MCH 27.4 26.0 - 34.0 pg   MCHC 30.6 30.0 - 36.0 g/dL   RDW 17.1 (H) 11.5 - 15.5 %   Platelets 246 150 - 400 K/uL   nRBC 0.0 0.0 - 0.2 %    Comment: Performed at [facility], [place] [address] [place], [place] [address]  Rapid urine drug screen (hospital performed)     Status: None   Collection Time:  10/07/20  4:56 AM  Result Value Ref Range   Opiates NONE DETECTED NONE DETECTED   Cocaine NONE DETECTED NONE DETECTED   Benzodiazepines NONE DETECTED NONE DETECTED   Amphetamines NONE DETECTED NONE DETECTED   Tetrahydrocannabinol NONE DETECTED NONE DETECTED   Barbiturates NONE DETECTED NONE DETECTED    Comment: (NOTE) DRUG SCREEN FOR MEDICAL PURPOSES ONLY.  IF CONFIRMATION IS NEEDED FOR ANY PURPOSE, NOTIFY LAB WITHIN 5 DAYS.  LOWEST DETECTABLE LIMITS FOR URINE DRUG SCREEN Drug Class                     Cutoff (ng/mL) Amphetamine and metabolites    1000 Barbiturate and metabolites    200 Benzodiazepine                 295 Tricyclics and metabolites     300 Opiates and metabolites        300 Cocaine and metabolites        300 THC                            50 Performed at [facility], [place] [address] [place], [place] [address]   Urinalysis, Routine w reflex microscopic     Status: Abnormal   Collection Time: 10/07/20  4:56 AM  Result Value Ref Range   Color, Urine YELLOW YELLOW   APPearance CLOUDY (A) CLEAR   Specific Gravity, Urine 1.013 1.005 - 1.030   pH 6.0 5.0 - 8.0   Glucose, UA NEGATIVE NEGATIVE mg/dL   Hgb urine dipstick NEGATIVE NEGATIVE   Bilirubin Urine NEGATIVE NEGATIVE   Ketones, ur NEGATIVE NEGATIVE mg/dL   Protein, ur NEGATIVE NEGATIVE mg/dL   Nitrite NEGATIVE  NEGATIVE   Leukocytes,Ua LARGE (A) NEGATIVE   RBC / HPF 6-10 0 - 5 RBC/hpf   WBC, UA >50 (H) 0 - 5 WBC/hpf   Bacteria, UA FEW (A) NONE SEEN   Squamous Epithelial / LPF 21-50 0 - 5   Trichomonas, UA PRESENT (A) NONE SEEN   Non Squamous Epithelial 0-5 (A) NONE SEEN    Comment: Performed at [facility], [place] [address], [place], [place] [address]  RPR     Status: None   Collection Time: 10/07/20  7:02 AM  Result Value Ref Range   RPR Ser Ql NON REACTIVE NON REACTIVE    Comment: Performed at [facility], [place] [address] [place], [place] [address]    Medications:  Current  Facility-Administered Medications  Medication Dose Route Frequency Provider Last Rate Last Admin  . atorvastatin (LIPITOR) tablet 10 mg  10 mg Oral Daily Maudie Flakes, MD   10 mg at 10/08/20 0931  . DULoxetine (CYMBALTA) DR capsule 60 mg  60 mg Oral Daily Maudie Flakes, MD   60 mg at 10/08/20 0930  . losartan (COZAAR) tablet 100 mg  100 mg Oral Daily Maudie Flakes, MD   100 mg at 10/08/20 0930  . metroNIDAZOLE (FLAGYL) tablet 500 mg  500 mg Oral Q12H Maudie Flakes, MD   500 mg at 10/08/20 0933  . pantoprazole (PROTONIX) EC tablet 40 mg  40 mg Oral Daily Maudie Flakes, MD   40 mg at 10/08/20 0931  . vitamin B-12 (CYANOCOBALAMIN) tablet 1,000 mcg  1,000 mcg Oral Daily Maudie Flakes, MD   1,000 mcg at 10/08/20 5732   Current Outpatient Medications  Medication Sig Dispense Refill  . atorvastatin (LIPITOR) 40 MG tablet Take 40 mg by mouth at bedtime.    Marland Kitchen BAYER LOW DOSE 81 MG EC tablet Take 81 mg by mouth in the morning. Swallow whole.    . Calcium Carbonate-Vitamin D (OYSTER-CAL 500 + D PO) Take 1 tablet by mouth daily.    . DULoxetine (CYMBALTA) 60 MG capsule Take 60 mg by mouth in the morning.    . lamoTRIgine (LAMICTAL) 150 MG tablet Take 75 mg by mouth in the morning.    Marland Kitchen losartan (COZAAR) 100 MG tablet Take 100 mg by mouth in the morning.    . methimazole (TAPAZOLE) 5 MG tablet Take 5 mg by mouth 3 (three) times daily.    . pantoprazole (PROTONIX) 40 MG tablet Take 1 tablet (40 mg total) by mouth daily. (Patient taking differently: Take 40 mg by mouth daily before breakfast.) 30 tablet 0  . sulfamethoxazole-trimethoprim (BACTRIM DS) 800-160 MG tablet Take 1 tablet by mouth every 12 (twelve) hours.    . traZODone (DESYREL) 50 MG tablet Take 50 mg by mouth at bedtime.    . vitamin B-12 (CYANOCOBALAMIN) 1000 MCG tablet Take 1,000 mcg by mouth in the morning.    Marland Kitchen acetaminophen (TYLENOL) 500 MG tablet You can take 1000 mg of Tylenol every 8 hours as needed for pain.  He can buy this  over-the-counter at any drugstore without a prescription.  Do not take more than 4000 mg of Tylenol per day it can harm your liver. (Patient not taking: Reported on 10/07/2020) 30 tablet 0    Musculoskeletal: Strength & Muscle Tone: Unable to determine via tele health but patient moving around in bed with no difficulty Gait & Station: Did not see patient ambulate Patient leans: N/A  Psychiatric Specialty  Exam: Physical Exam Vitals and nursing note reviewed.  Constitutional:      General: She is not in acute distress.    Appearance: Normal appearance. She is not ill-appearing.  Cardiovascular:     Rate and Rhythm: Tachycardia present.  Pulmonary:     Effort: Pulmonary effort is normal.  Neurological:     Mental Status: She is alert.  Psychiatric:        Attention and Perception: Attention and perception normal. She does not perceive auditory or visual hallucinations.        Mood and Affect: Mood is anxious.        Speech: Speech normal.        Behavior: Behavior normal. Behavior is cooperative.        Thought Content: Thought content normal. Thought content is not paranoid or delusional. Thought content does not include homicidal or suicidal ideation.        Cognition and Memory: Cognition normal.        Judgment: Judgment is impulsive.     Review of Systems  Unable to perform ROS: Mental status change  Constitutional:       Patients husband reported patient not eating or drinking   Genitourinary:       Recent UTI   Neurological:       Recent stroke  Psychiatric/Behavioral: Negative for agitation, hallucinations, self-injury, sleep disturbance and suicidal ideas. Behavioral problem: Stated she is aware or recent changes in her behavior and mental status. Confusion: Reports she has improved some but continues to have episodes of confusion. The patient is nervous/anxious. The patient is not hyperactive.     Blood pressure 131/85, pulse (!) 113, temperature 97.8 F (36.6 C),  temperature source Oral, resp. rate 20, SpO2 99 %.There is no height or weight on file to calculate BMI.  General Appearance: Casual  Eye Contact:  Good  Speech:  Clear and Coherent and Normal Rate  Volume:  Normal  Mood:  Anxious  Affect:  Appropriate and Congruent  Thought Process:  Coherent, Goal Directed and Descriptions of Associations: Circumstantial  Orientation:  Full (Time, Place, and Person)  Thought Content:  Logical and Rumination  Suicidal Thoughts:  No  Homicidal Thoughts:  No  Memory:  Immediate;   Fair Recent;   Fair  Judgement:  Fair  Insight:  Fair and Present  Psychomotor Activity:  Normal  Concentration:  Concentration: Fair and Attention Span: Fair  Recall:  [employer] of Knowledge:  Fair  Language:  Good  Akathisia:  No  Handed:  Right  AIMS (if indicated):     Assets:  Communication Skills Desire for Improvement Housing Social Support  ADL's:  Intact  Cognition:  WNL  Sleep:      Treatment Plan Summary: Plan Psychiatrically clear.  Follow up with current outptient psychiatric provider  Disposition: No evidence of imminent risk to self or others at present.   Patient does not meet criteria for psychiatric inpatient admission. Supportive therapy provided about ongoing stressors. Discussed crisis plan, support from social network, calling 911, coming to the Emergency Department, and calling Suicide Hotline.  This service was provided via telemedicine using a 2-way, interactive audio and video technology.  Names of all persons participating in this telemedicine service and their role in this encounter. Name: Earleen Newport Role: NP  Name: Dr. Ernie Hew Role: Psychiatrist  Name: Wilford Grist Role: Patient  Name: Dr. Davonna Belling Role: [facility] EDP spoke to via phone and sent secure message informing:  Patient seen and psychiatrically cleared.  Based on patient document recent UTI, previous stroke and recent infection of Trichomoniasis patient is  possibly the cause of recent behavior and mental status.  Patient reporting, she is feeling better but still a little confused.  Oriented x 4.  Unsure of how she was infected with Trichomoniasis just wanted to make sure someone address if husband needs to be treated also.       Infiniti Hoefling, NP 10/08/2020 10:11 AM

## 2020-10-08 NOTE — ED Notes (Signed)
Pt up, wandering around room with shoes in hands. Leaves room and approaches nurses' station stating "it is time for me to go home". This nurse attempted to redirect pt to bed. Pt displayed signs of agitation, oriented to person, place and somewhat oriented to time but does not understand full dynamics of situation. This nurse explained to pt that she is overnight observation status and assisted pt back into bed. Pt appears to be displeased with answers given by this RN. Will continue to monitor for new or developing symptoms or progression of agitation.

## 2020-10-08 NOTE — ED Notes (Signed)
TTS done 

## 2020-10-08 NOTE — Discharge Instructions (Addendum)
Take the antibiotics to help treat the infection.  Any sexual partners will also need to be treated.  Follow-up with your doctor as needed for the confusion

## 2023-12-01 ENCOUNTER — Emergency Department (HOSPITAL_COMMUNITY)
Admission: EM | Admit: 2023-12-01 | Discharge: 2023-12-01 | Disposition: A | Discharge status: Home / Self Care | Attending: Emergency Medicine | Admitting: Emergency Medicine

## 2023-12-01 ENCOUNTER — Other Ambulatory Visit: Payer: Self-pay

## 2023-12-01 DIAGNOSIS — K625 Hemorrhage of anus and rectum: Secondary | ICD-10-CM

## 2023-12-01 DIAGNOSIS — E876 Hypokalemia: Secondary | ICD-10-CM | POA: Diagnosis not present

## 2023-12-01 DIAGNOSIS — K648 Other hemorrhoids: Secondary | ICD-10-CM | POA: Diagnosis not present

## 2023-12-01 LAB — CBC
HCT: 33 % — ABNORMAL LOW (ref 36.0–46.0)
Hemoglobin: 10.2 g/dL — ABNORMAL LOW (ref 12.0–15.0)
MCH: 29.4 pg (ref 26.0–34.0)
MCHC: 30.9 g/dL (ref 30.0–36.0)
MCV: 95.1 fL (ref 80.0–100.0)
Platelets: 186 10*3/uL (ref 150–400)
RBC: 3.47 MIL/uL — ABNORMAL LOW (ref 3.87–5.11)
RDW: 14.2 % (ref 11.5–15.5)
WBC: 4 10*3/uL (ref 4.0–10.5)
nRBC: 0 % (ref 0.0–0.2)

## 2023-12-01 LAB — COMPREHENSIVE METABOLIC PANEL WITH GFR
ALT: 13 U/L (ref 0–44)
AST: 13 U/L — ABNORMAL LOW (ref 15–41)
Albumin: 3.8 g/dL (ref 3.5–5.0)
Alkaline Phosphatase: 40 U/L (ref 38–126)
Anion gap: 7 (ref 5–15)
BUN: 8 mg/dL (ref 8–23)
CO2: 26 mmol/L (ref 22–32)
Calcium: 9.6 mg/dL (ref 8.9–10.3)
Chloride: 108 mmol/L (ref 98–111)
Creatinine, Ser: 1.16 mg/dL — ABNORMAL HIGH (ref 0.44–1.00)
GFR, Estimated: 49 mL/min — ABNORMAL LOW (ref >60)
Glucose, Bld: 100 mg/dL — ABNORMAL HIGH (ref 70–99)
Potassium: 3 mmol/L — ABNORMAL LOW (ref 3.5–5.1)
Sodium: 141 mmol/L (ref 135–145)
Total Bilirubin: 1.3 mg/dL — ABNORMAL HIGH (ref 0.0–1.2)
Total Protein: 6 g/dL — ABNORMAL LOW (ref 6.5–8.1)

## 2023-12-01 LAB — TYPE AND SCREEN
ABO/RH(D): O POS
Antibody Screen: NEGATIVE

## 2023-12-01 MED ORDER — MAGNESIUM OXIDE -MG SUPPLEMENT 400 (240 MG) MG PO TABS
800.0000 mg | ORAL_TABLET | Freq: Once | ORAL | Status: AC
  Administered 2023-12-01: 800 mg via ORAL
  Filled 2023-12-01: qty 2

## 2023-12-01 MED ORDER — POTASSIUM CHLORIDE 20 MEQ PO PACK
60.0000 meq | PACK | Freq: Two times a day (BID) | ORAL | Status: DC
  Administered 2023-12-01: 60 meq via ORAL
  Filled 2023-12-01: qty 3

## 2023-12-01 NOTE — ED Triage Notes (Addendum)
 Husband stated, she couldn't have a bowel movement so I gave her an enema. So it looks like some of her hemoroids have come out or her intestines. I was belling and she passed a few clots..  Pt has had a stroke previous.

## 2023-12-01 NOTE — ED Provider Notes (Signed)
 [place] EMERGENCY DEPARTMENT AT [facility] Provider Note   CSN: [id number] Arrival date & time: 12/01/23  1352     History  Chief Complaint  Patient presents with   Rectal Bleeding    [name] is a 73 y.o. female.  73 yo F with a cc of rectal bleeding.  The patient has had some trouble moving her bowels.  Has had issues with hemorrhoids.  Had asked for an enema this morning.  They had given her one and then later found her to have some bright red blood per rectum.  On inspection they noticed a new area that they had not seen before.  They brought her in for evaluation.  She denies abdominal pain denies fevers denies vomiting.   Rectal Bleeding      Home Medications Prior to Admission medications   Medication Sig Start Date End Date Taking? Authorizing Provider  acetaminophen  (TYLENOL ) 500 MG tablet You can take 1000 mg of Tylenol  every 8 hours as needed for pain.  He can buy this over-the-counter at any drugstore without a prescription.  Do not take more than 4000 mg of Tylenol  per day it can harm your liver. Patient not taking: Reported on 10/07/2020 01/29/19   Monetta Angst, PA-C  atorvastatin  (LIPITOR) 40 MG tablet Take 40 mg by mouth at bedtime.    [provider]  BAYER LOW DOSE 81 MG EC tablet Take 81 mg by mouth in the morning. Swallow whole.    [provider]  Calcium  Carbonate-Vitamin D  (OYSTER-CAL 500 + D PO) Take 1 tablet by mouth daily.    [provider]  DULoxetine  (CYMBALTA ) 60 MG capsule Take 60 mg by mouth in the morning.    [provider]  lamoTRIgine  (LAMICTAL ) 150 MG tablet Take 75 mg by mouth in the morning.    [provider]  losartan  (COZAAR ) 100 MG tablet Take 100 mg by mouth in the morning. 03/31/20   [provider]  methimazole (TAPAZOLE) 5 MG tablet Take 5 mg by mouth 3 (three) times daily.    [provider]  metroNIDAZOLE  (FLAGYL ) 500 MG tablet Take 1 tablet (500  mg total) by mouth 2 (two) times daily. 10/08/20   Mozell Arias, MD  pantoprazole  (PROTONIX ) 40 MG tablet Take 1 tablet (40 mg total) by mouth daily. Patient taking differently: Take 40 mg by mouth daily before breakfast. 01/30/19   Sheikh, Omair Latif, DO  sulfamethoxazole-trimethoprim (BACTRIM DS) 800-160 MG tablet Take 1 tablet by mouth every 12 (twelve) hours. 09/29/20   [provider]  traZODone  (DESYREL ) 50 MG tablet Take 50 mg by mouth at bedtime.    [provider]  vitamin B-12 (CYANOCOBALAMIN ) 1000 MCG tablet Take 1,000 mcg by mouth in the morning.    [provider]      Allergies    Nickel, Penicillins, and Wool alcohol [lanolin]    Review of Systems   Review of Systems  Gastrointestinal:  Positive for hematochezia.    Physical Exam Updated Vital Signs BP 127/84 (BP Location: Left Arm)   Pulse 79   Temp 98.3 F (36.8 C)   Resp 15   SpO2 93%  Physical Exam Vitals and nursing note reviewed.  Constitutional:      General: She is not in acute distress.    Appearance: She is well-developed. She is not diaphoretic.  HENT:     Head: Normocephalic and atraumatic.  Eyes:     Pupils: Pupils are  equal, round, and reactive to light.  Cardiovascular:     Rate and Rhythm: Normal rate and regular rhythm.     Heart sounds: No murmur heard.    No friction rub. No gallop.  Pulmonary:     Effort: Pulmonary effort is normal.     Breath sounds: No wheezing or rales.  Abdominal:     General: There is no distension.     Palpations: Abdomen is soft.     Tenderness: There is no abdominal tenderness.  Genitourinary:    Comments: Patient has some small external hemorrhoids that are not engorged or bleeding.  She does have a larger hemorrhoid that is nontender that has prolapsed.  No signs of thrombosis.  No active bleeding. Musculoskeletal:        General: No tenderness.     Cervical back: Normal range of motion and neck supple.  Skin:    General: Skin  is warm and dry.  Neurological:     Mental Status: She is alert and oriented to person, place, and time.  Psychiatric:        Behavior: Behavior normal.     ED Results / Procedures / Treatments   Labs (all labs ordered are listed, but only abnormal results are displayed) Labs Reviewed  COMPREHENSIVE METABOLIC PANEL WITH GFR - Abnormal; Notable for the following components:      Result Value   Potassium 3.0 (*)    Glucose, Bld 100 (*)    Creatinine, Ser 1.16 (*)    Total Protein 6.0 (*)    AST 13 (*)    Total Bilirubin 1.3 (*)    GFR, Estimated 49 (*)    All other components within normal limits  CBC - Abnormal; Notable for the following components:   RBC 3.47 (*)    Hemoglobin 10.2 (*)    HCT 33.0 (*)    All other components within normal limits  TYPE AND SCREEN    EKG None  Radiology No results found.  Procedures Procedures    Medications Ordered in ED Medications  potassium chloride  (KLOR-CON ) packet 60 mEq (has no administration in time range)  magnesium  oxide (MAG-OX) tablet 800 mg (has no administration in time range)    ED Course/ Medical Decision Making/ A&P                                 Medical Decision Making Amount and/or Complexity of Data Reviewed Labs: ordered.  Risk OTC drugs. Prescription drug management.   73 yo F with a chief complaint of rectal bleeding.  This started after having an enema earlier today.  Patient has known hemorrhoids family had done a visual inspection and were concerned about a new area.  Brought her in for evaluation.  On exam it seems most consistent with a prolapsed internal hemorrhoid.  No active bleeding.  Hemoglobin appears to be close to baseline.  Mild hypokalemia will replenish orally.  Discussed symptomatic therapy at home.  PCP follow-up.  The patients results and plan were reviewed and discussed.   Any x-rays performed were independently reviewed by myself.   Differential diagnosis were considered  with the presenting HPI.  Medications  potassium chloride  (KLOR-CON ) packet 60 mEq (has no administration in time range)  magnesium  oxide (MAG-OX) tablet 800 mg (has no administration in time range)    Vitals:   12/01/23 1356 12/01/23 1702 12/01/23 2135  BP: 128/89 136/87 127/84  Pulse: 86 78 79  Resp: 19 18 15   Temp: 98.4 F (36.9 C) 98.3 F (36.8 C) 98.3 F (36.8 C)  SpO2: 98% 97% 93%    Final diagnoses:  Rectal bleeding  Hemorrhoid prolapse           Final Clinical Impression(s) / ED Diagnoses Final diagnoses:  Rectal bleeding  Hemorrhoid prolapse    Rx / DC Orders ED Discharge Orders     None         Albertus Hughs, DO 12/01/23 2217

## 2023-12-01 NOTE — Discharge Instructions (Signed)
 Try preparation H.  I think the ointment works the best.  Apply a couple times a day and after bowel movements especially before bed.  Typically less time on the toilet tends to improve symptoms.  Continue your stool softener.  Try MiraLAX.  Follow the instructions on the office.  Please call your doctor tomorrow and discuss your visit here and see when they want to see you in the office.  I have given you information to see the general surgeon in the office as well.

## 2023-12-01 NOTE — ED Provider Triage Note (Signed)
 Emergency Medicine Provider Triage Evaluation Note  [name] , a 73 y.o. female  was evaluated in triage.  Pt complains of hx of hemorrhoids and constipation. Husband gave enema. Patient was able to have BM after enema but did have a lot of bleeding. Husband concerned because now he is seeing a very large hemorrhoid. Patient without hemorrhoid or abdominal pain.  Review of Systems  Positive:  Negative:   Physical Exam  BP 128/89 (BP Location: Left Arm)   Pulse 86   Temp 98.4 F (36.9 C)   Resp 19   SpO2 98%  Gen:   Awake, no distress   Resp:  Normal effort  MSK:   Moves extremities without difficulty  Other:    Medical Decision Making  Medically screening exam initiated at 2:26 PM.  Appropriate orders placed.  [name] was informed that the remainder of the evaluation will be completed by another provider, this initial triage assessment does not replace that evaluation, and the importance of remaining in the ED until their evaluation is complete.    Dorisann Garre F, [place] 12/01/23 1428

## 2024-02-20 ENCOUNTER — Ambulatory Visit
Admission: RE | Admit: 2024-02-20 | Discharge: 2024-02-20 | Disposition: A | Discharge status: Home / Self Care | Source: Ambulatory Visit | Attending: Family Medicine | Admitting: Family Medicine

## 2024-02-20 ENCOUNTER — Other Ambulatory Visit: Payer: Self-pay | Admitting: Family Medicine

## 2024-02-20 DIAGNOSIS — R053 Chronic cough: Secondary | ICD-10-CM
